# Patient Record
Sex: Male | Born: 1945 | Race: White | Hispanic: No | Marital: Single | Smoking: Current every day smoker
Health system: Southern US, Community
[De-identification: ages and names within clinical notes are randomized; demographics above are authoritative.]

## PROBLEM LIST (undated history)

## (undated) DIAGNOSIS — I10 Essential (primary) hypertension: Secondary | ICD-10-CM

## (undated) DIAGNOSIS — I739 Peripheral vascular disease, unspecified: Secondary | ICD-10-CM

## (undated) DIAGNOSIS — Z720 Tobacco use: Secondary | ICD-10-CM

## (undated) DIAGNOSIS — E785 Hyperlipidemia, unspecified: Secondary | ICD-10-CM

## (undated) DIAGNOSIS — I219 Acute myocardial infarction, unspecified: Secondary | ICD-10-CM

## (undated) DIAGNOSIS — E119 Type 2 diabetes mellitus without complications: Secondary | ICD-10-CM

## (undated) HISTORY — DX: Type 2 diabetes mellitus without complications: E11.9

## (undated) HISTORY — DX: Hyperlipidemia, unspecified: E78.5

## (undated) HISTORY — DX: Essential (primary) hypertension: I10

## (undated) HISTORY — DX: Peripheral vascular disease, unspecified: I73.9

---

## 2016-11-19 DIAGNOSIS — Z9181 History of falling: Secondary | ICD-10-CM | POA: Diagnosis not present

## 2016-11-19 DIAGNOSIS — Z0000 Encounter for general adult medical examination without abnormal findings: Secondary | ICD-10-CM | POA: Diagnosis not present

## 2016-11-19 DIAGNOSIS — Z125 Encounter for screening for malignant neoplasm of prostate: Secondary | ICD-10-CM | POA: Diagnosis not present

## 2016-11-19 DIAGNOSIS — I10 Essential (primary) hypertension: Secondary | ICD-10-CM | POA: Diagnosis not present

## 2016-11-19 DIAGNOSIS — E7800 Pure hypercholesterolemia, unspecified: Secondary | ICD-10-CM | POA: Diagnosis not present

## 2016-11-19 DIAGNOSIS — Z1389 Encounter for screening for other disorder: Secondary | ICD-10-CM | POA: Diagnosis not present

## 2016-11-19 DIAGNOSIS — R7303 Prediabetes: Secondary | ICD-10-CM | POA: Diagnosis not present

## 2016-11-19 DIAGNOSIS — E663 Overweight: Secondary | ICD-10-CM | POA: Diagnosis not present

## 2016-12-03 DIAGNOSIS — Z79899 Other long term (current) drug therapy: Secondary | ICD-10-CM | POA: Diagnosis not present

## 2016-12-03 DIAGNOSIS — E785 Hyperlipidemia, unspecified: Secondary | ICD-10-CM | POA: Diagnosis not present

## 2016-12-03 DIAGNOSIS — R69 Illness, unspecified: Secondary | ICD-10-CM | POA: Diagnosis not present

## 2016-12-03 DIAGNOSIS — E1169 Type 2 diabetes mellitus with other specified complication: Secondary | ICD-10-CM | POA: Diagnosis not present

## 2016-12-03 DIAGNOSIS — Z716 Tobacco abuse counseling: Secondary | ICD-10-CM | POA: Diagnosis not present

## 2016-12-03 DIAGNOSIS — J449 Chronic obstructive pulmonary disease, unspecified: Secondary | ICD-10-CM | POA: Diagnosis not present

## 2016-12-03 DIAGNOSIS — Z720 Tobacco use: Secondary | ICD-10-CM | POA: Diagnosis not present

## 2016-12-03 DIAGNOSIS — I10 Essential (primary) hypertension: Secondary | ICD-10-CM | POA: Diagnosis not present

## 2016-12-03 DIAGNOSIS — I7090 Unspecified atherosclerosis: Secondary | ICD-10-CM | POA: Diagnosis not present

## 2016-12-17 DIAGNOSIS — E1169 Type 2 diabetes mellitus with other specified complication: Secondary | ICD-10-CM | POA: Diagnosis not present

## 2016-12-17 DIAGNOSIS — E785 Hyperlipidemia, unspecified: Secondary | ICD-10-CM | POA: Diagnosis not present

## 2016-12-17 DIAGNOSIS — J449 Chronic obstructive pulmonary disease, unspecified: Secondary | ICD-10-CM | POA: Diagnosis not present

## 2016-12-23 DIAGNOSIS — R351 Nocturia: Secondary | ICD-10-CM | POA: Diagnosis not present

## 2016-12-23 DIAGNOSIS — I10 Essential (primary) hypertension: Secondary | ICD-10-CM | POA: Diagnosis not present

## 2016-12-23 DIAGNOSIS — Z79899 Other long term (current) drug therapy: Secondary | ICD-10-CM | POA: Diagnosis not present

## 2016-12-23 DIAGNOSIS — K08409 Partial loss of teeth, unspecified cause, unspecified class: Secondary | ICD-10-CM | POA: Diagnosis not present

## 2016-12-23 DIAGNOSIS — R69 Illness, unspecified: Secondary | ICD-10-CM | POA: Diagnosis not present

## 2016-12-23 DIAGNOSIS — Z0000 Encounter for general adult medical examination without abnormal findings: Secondary | ICD-10-CM | POA: Diagnosis not present

## 2016-12-23 DIAGNOSIS — Z972 Presence of dental prosthetic device (complete) (partial): Secondary | ICD-10-CM | POA: Diagnosis not present

## 2016-12-23 DIAGNOSIS — J449 Chronic obstructive pulmonary disease, unspecified: Secondary | ICD-10-CM | POA: Diagnosis not present

## 2016-12-23 DIAGNOSIS — E663 Overweight: Secondary | ICD-10-CM | POA: Diagnosis not present

## 2016-12-23 DIAGNOSIS — E785 Hyperlipidemia, unspecified: Secondary | ICD-10-CM | POA: Diagnosis not present

## 2016-12-23 DIAGNOSIS — E119 Type 2 diabetes mellitus without complications: Secondary | ICD-10-CM | POA: Diagnosis not present

## 2016-12-23 DIAGNOSIS — R3915 Urgency of urination: Secondary | ICD-10-CM | POA: Diagnosis not present

## 2017-02-24 DIAGNOSIS — H524 Presbyopia: Secondary | ICD-10-CM | POA: Diagnosis not present

## 2017-02-24 DIAGNOSIS — H25042 Posterior subcapsular polar age-related cataract, left eye: Secondary | ICD-10-CM | POA: Diagnosis not present

## 2017-02-24 DIAGNOSIS — H5213 Myopia, bilateral: Secondary | ICD-10-CM | POA: Diagnosis not present

## 2017-02-24 DIAGNOSIS — H52223 Regular astigmatism, bilateral: Secondary | ICD-10-CM | POA: Diagnosis not present

## 2017-02-24 DIAGNOSIS — H25811 Combined forms of age-related cataract, right eye: Secondary | ICD-10-CM | POA: Diagnosis not present

## 2017-02-25 DIAGNOSIS — H524 Presbyopia: Secondary | ICD-10-CM | POA: Diagnosis not present

## 2017-02-25 DIAGNOSIS — H52209 Unspecified astigmatism, unspecified eye: Secondary | ICD-10-CM | POA: Diagnosis not present

## 2017-02-25 DIAGNOSIS — H5213 Myopia, bilateral: Secondary | ICD-10-CM | POA: Diagnosis not present

## 2017-04-16 DIAGNOSIS — E785 Hyperlipidemia, unspecified: Secondary | ICD-10-CM | POA: Diagnosis not present

## 2017-04-16 DIAGNOSIS — Z1389 Encounter for screening for other disorder: Secondary | ICD-10-CM | POA: Diagnosis not present

## 2017-04-16 DIAGNOSIS — R7303 Prediabetes: Secondary | ICD-10-CM | POA: Diagnosis not present

## 2017-04-16 DIAGNOSIS — I10 Essential (primary) hypertension: Secondary | ICD-10-CM | POA: Diagnosis not present

## 2017-04-16 DIAGNOSIS — Z125 Encounter for screening for malignant neoplasm of prostate: Secondary | ICD-10-CM | POA: Diagnosis not present

## 2017-04-16 DIAGNOSIS — Z23 Encounter for immunization: Secondary | ICD-10-CM | POA: Diagnosis not present

## 2017-04-16 DIAGNOSIS — Z0000 Encounter for general adult medical examination without abnormal findings: Secondary | ICD-10-CM | POA: Diagnosis not present

## 2017-04-16 DIAGNOSIS — Z136 Encounter for screening for cardiovascular disorders: Secondary | ICD-10-CM | POA: Diagnosis not present

## 2017-04-16 DIAGNOSIS — Z79899 Other long term (current) drug therapy: Secondary | ICD-10-CM | POA: Diagnosis not present

## 2017-06-02 DIAGNOSIS — J449 Chronic obstructive pulmonary disease, unspecified: Secondary | ICD-10-CM | POA: Diagnosis not present

## 2017-06-02 DIAGNOSIS — Z87891 Personal history of nicotine dependence: Secondary | ICD-10-CM | POA: Diagnosis not present

## 2017-06-02 DIAGNOSIS — E1169 Type 2 diabetes mellitus with other specified complication: Secondary | ICD-10-CM | POA: Diagnosis not present

## 2017-06-02 DIAGNOSIS — I7090 Unspecified atherosclerosis: Secondary | ICD-10-CM | POA: Diagnosis not present

## 2017-06-02 DIAGNOSIS — Z716 Tobacco abuse counseling: Secondary | ICD-10-CM | POA: Diagnosis not present

## 2017-06-02 DIAGNOSIS — I10 Essential (primary) hypertension: Secondary | ICD-10-CM | POA: Diagnosis not present

## 2017-06-02 DIAGNOSIS — Z720 Tobacco use: Secondary | ICD-10-CM | POA: Diagnosis not present

## 2017-06-02 DIAGNOSIS — E785 Hyperlipidemia, unspecified: Secondary | ICD-10-CM | POA: Diagnosis not present

## 2017-06-02 DIAGNOSIS — E663 Overweight: Secondary | ICD-10-CM | POA: Diagnosis not present

## 2017-09-02 DIAGNOSIS — I7090 Unspecified atherosclerosis: Secondary | ICD-10-CM | POA: Diagnosis not present

## 2017-09-02 DIAGNOSIS — I10 Essential (primary) hypertension: Secondary | ICD-10-CM | POA: Diagnosis not present

## 2017-09-02 DIAGNOSIS — J449 Chronic obstructive pulmonary disease, unspecified: Secondary | ICD-10-CM | POA: Diagnosis not present

## 2017-09-02 DIAGNOSIS — D751 Secondary polycythemia: Secondary | ICD-10-CM | POA: Diagnosis not present

## 2017-09-02 DIAGNOSIS — E663 Overweight: Secondary | ICD-10-CM | POA: Diagnosis not present

## 2017-09-02 DIAGNOSIS — E785 Hyperlipidemia, unspecified: Secondary | ICD-10-CM | POA: Diagnosis not present

## 2017-09-02 DIAGNOSIS — E1169 Type 2 diabetes mellitus with other specified complication: Secondary | ICD-10-CM | POA: Diagnosis not present

## 2017-12-10 DIAGNOSIS — J449 Chronic obstructive pulmonary disease, unspecified: Secondary | ICD-10-CM | POA: Diagnosis not present

## 2017-12-10 DIAGNOSIS — I10 Essential (primary) hypertension: Secondary | ICD-10-CM | POA: Diagnosis not present

## 2017-12-10 DIAGNOSIS — Z79899 Other long term (current) drug therapy: Secondary | ICD-10-CM | POA: Diagnosis not present

## 2017-12-10 DIAGNOSIS — Z1211 Encounter for screening for malignant neoplasm of colon: Secondary | ICD-10-CM | POA: Diagnosis not present

## 2017-12-10 DIAGNOSIS — E119 Type 2 diabetes mellitus without complications: Secondary | ICD-10-CM | POA: Diagnosis not present

## 2017-12-10 DIAGNOSIS — Z720 Tobacco use: Secondary | ICD-10-CM | POA: Diagnosis not present

## 2017-12-10 DIAGNOSIS — Z125 Encounter for screening for malignant neoplasm of prostate: Secondary | ICD-10-CM | POA: Diagnosis not present

## 2017-12-10 DIAGNOSIS — Z0000 Encounter for general adult medical examination without abnormal findings: Secondary | ICD-10-CM | POA: Diagnosis not present

## 2017-12-22 DIAGNOSIS — I10 Essential (primary) hypertension: Secondary | ICD-10-CM | POA: Diagnosis not present

## 2018-01-22 DIAGNOSIS — Z1339 Encounter for screening examination for other mental health and behavioral disorders: Secondary | ICD-10-CM | POA: Diagnosis not present

## 2018-01-22 DIAGNOSIS — J449 Chronic obstructive pulmonary disease, unspecified: Secondary | ICD-10-CM | POA: Diagnosis not present

## 2018-01-22 DIAGNOSIS — I10 Essential (primary) hypertension: Secondary | ICD-10-CM | POA: Diagnosis not present

## 2018-03-18 DIAGNOSIS — E1169 Type 2 diabetes mellitus with other specified complication: Secondary | ICD-10-CM | POA: Diagnosis not present

## 2018-03-18 DIAGNOSIS — I10 Essential (primary) hypertension: Secondary | ICD-10-CM | POA: Diagnosis not present

## 2018-03-18 DIAGNOSIS — J449 Chronic obstructive pulmonary disease, unspecified: Secondary | ICD-10-CM | POA: Diagnosis not present

## 2018-04-02 DIAGNOSIS — H52223 Regular astigmatism, bilateral: Secondary | ICD-10-CM | POA: Diagnosis not present

## 2018-07-01 DIAGNOSIS — E119 Type 2 diabetes mellitus without complications: Secondary | ICD-10-CM | POA: Diagnosis not present

## 2018-07-01 DIAGNOSIS — I10 Essential (primary) hypertension: Secondary | ICD-10-CM | POA: Diagnosis not present

## 2018-07-01 DIAGNOSIS — R69 Illness, unspecified: Secondary | ICD-10-CM | POA: Diagnosis not present

## 2018-07-01 DIAGNOSIS — J449 Chronic obstructive pulmonary disease, unspecified: Secondary | ICD-10-CM | POA: Diagnosis not present

## 2018-10-14 DIAGNOSIS — M791 Myalgia, unspecified site: Secondary | ICD-10-CM | POA: Diagnosis not present

## 2018-10-14 DIAGNOSIS — R69 Illness, unspecified: Secondary | ICD-10-CM | POA: Diagnosis not present

## 2018-10-14 DIAGNOSIS — J449 Chronic obstructive pulmonary disease, unspecified: Secondary | ICD-10-CM | POA: Diagnosis not present

## 2018-10-14 DIAGNOSIS — E1169 Type 2 diabetes mellitus with other specified complication: Secondary | ICD-10-CM | POA: Diagnosis not present

## 2018-10-14 DIAGNOSIS — I10 Essential (primary) hypertension: Secondary | ICD-10-CM | POA: Diagnosis not present

## 2018-10-14 DIAGNOSIS — E119 Type 2 diabetes mellitus without complications: Secondary | ICD-10-CM | POA: Diagnosis not present

## 2018-10-20 DIAGNOSIS — E1169 Type 2 diabetes mellitus with other specified complication: Secondary | ICD-10-CM | POA: Diagnosis not present

## 2018-10-20 DIAGNOSIS — M791 Myalgia, unspecified site: Secondary | ICD-10-CM | POA: Diagnosis not present

## 2018-10-20 DIAGNOSIS — E119 Type 2 diabetes mellitus without complications: Secondary | ICD-10-CM | POA: Diagnosis not present

## 2019-02-03 DIAGNOSIS — I10 Essential (primary) hypertension: Secondary | ICD-10-CM | POA: Diagnosis not present

## 2019-02-03 DIAGNOSIS — E559 Vitamin D deficiency, unspecified: Secondary | ICD-10-CM | POA: Diagnosis not present

## 2019-02-03 DIAGNOSIS — Z1331 Encounter for screening for depression: Secondary | ICD-10-CM | POA: Diagnosis not present

## 2019-02-03 DIAGNOSIS — R69 Illness, unspecified: Secondary | ICD-10-CM | POA: Diagnosis not present

## 2019-02-03 DIAGNOSIS — E1169 Type 2 diabetes mellitus with other specified complication: Secondary | ICD-10-CM | POA: Diagnosis not present

## 2019-02-03 DIAGNOSIS — J449 Chronic obstructive pulmonary disease, unspecified: Secondary | ICD-10-CM | POA: Diagnosis not present

## 2019-02-03 DIAGNOSIS — Z139 Encounter for screening, unspecified: Secondary | ICD-10-CM | POA: Diagnosis not present

## 2019-02-03 DIAGNOSIS — E7800 Pure hypercholesterolemia, unspecified: Secondary | ICD-10-CM | POA: Diagnosis not present

## 2019-02-03 DIAGNOSIS — Z9181 History of falling: Secondary | ICD-10-CM | POA: Diagnosis not present

## 2019-02-03 DIAGNOSIS — N400 Enlarged prostate without lower urinary tract symptoms: Secondary | ICD-10-CM | POA: Diagnosis not present

## 2019-02-17 DIAGNOSIS — N400 Enlarged prostate without lower urinary tract symptoms: Secondary | ICD-10-CM | POA: Diagnosis not present

## 2019-02-17 DIAGNOSIS — E1169 Type 2 diabetes mellitus with other specified complication: Secondary | ICD-10-CM | POA: Diagnosis not present

## 2019-05-06 DIAGNOSIS — H5213 Myopia, bilateral: Secondary | ICD-10-CM | POA: Diagnosis not present

## 2019-05-11 DIAGNOSIS — E7800 Pure hypercholesterolemia, unspecified: Secondary | ICD-10-CM | POA: Diagnosis not present

## 2019-05-11 DIAGNOSIS — E559 Vitamin D deficiency, unspecified: Secondary | ICD-10-CM | POA: Diagnosis not present

## 2019-05-11 DIAGNOSIS — Z23 Encounter for immunization: Secondary | ICD-10-CM | POA: Diagnosis not present

## 2019-05-11 DIAGNOSIS — R69 Illness, unspecified: Secondary | ICD-10-CM | POA: Diagnosis not present

## 2019-05-11 DIAGNOSIS — N400 Enlarged prostate without lower urinary tract symptoms: Secondary | ICD-10-CM | POA: Diagnosis not present

## 2019-05-11 DIAGNOSIS — E1169 Type 2 diabetes mellitus with other specified complication: Secondary | ICD-10-CM | POA: Diagnosis not present

## 2019-05-11 DIAGNOSIS — I10 Essential (primary) hypertension: Secondary | ICD-10-CM | POA: Diagnosis not present

## 2019-05-11 DIAGNOSIS — J449 Chronic obstructive pulmonary disease, unspecified: Secondary | ICD-10-CM | POA: Diagnosis not present

## 2019-05-19 DIAGNOSIS — Z125 Encounter for screening for malignant neoplasm of prostate: Secondary | ICD-10-CM | POA: Diagnosis not present

## 2019-05-19 DIAGNOSIS — Z0000 Encounter for general adult medical examination without abnormal findings: Secondary | ICD-10-CM | POA: Diagnosis not present

## 2019-05-19 DIAGNOSIS — Z136 Encounter for screening for cardiovascular disorders: Secondary | ICD-10-CM | POA: Diagnosis not present

## 2019-05-19 DIAGNOSIS — Z9181 History of falling: Secondary | ICD-10-CM | POA: Diagnosis not present

## 2019-05-19 DIAGNOSIS — E785 Hyperlipidemia, unspecified: Secondary | ICD-10-CM | POA: Diagnosis not present

## 2019-05-19 DIAGNOSIS — Z1211 Encounter for screening for malignant neoplasm of colon: Secondary | ICD-10-CM | POA: Diagnosis not present

## 2019-05-19 DIAGNOSIS — Z1331 Encounter for screening for depression: Secondary | ICD-10-CM | POA: Diagnosis not present

## 2019-08-02 DIAGNOSIS — E119 Type 2 diabetes mellitus without complications: Secondary | ICD-10-CM | POA: Diagnosis not present

## 2019-08-02 DIAGNOSIS — Z01818 Encounter for other preprocedural examination: Secondary | ICD-10-CM | POA: Diagnosis not present

## 2019-08-02 DIAGNOSIS — H25812 Combined forms of age-related cataract, left eye: Secondary | ICD-10-CM | POA: Diagnosis not present

## 2019-08-09 DIAGNOSIS — H259 Unspecified age-related cataract: Secondary | ICD-10-CM | POA: Diagnosis not present

## 2019-08-09 DIAGNOSIS — R69 Illness, unspecified: Secondary | ICD-10-CM | POA: Diagnosis not present

## 2019-08-09 DIAGNOSIS — J449 Chronic obstructive pulmonary disease, unspecified: Secondary | ICD-10-CM | POA: Diagnosis not present

## 2019-08-09 DIAGNOSIS — H25812 Combined forms of age-related cataract, left eye: Secondary | ICD-10-CM | POA: Diagnosis not present

## 2019-08-09 DIAGNOSIS — I10 Essential (primary) hypertension: Secondary | ICD-10-CM | POA: Diagnosis not present

## 2019-08-09 DIAGNOSIS — E785 Hyperlipidemia, unspecified: Secondary | ICD-10-CM | POA: Diagnosis not present

## 2019-08-09 DIAGNOSIS — E1136 Type 2 diabetes mellitus with diabetic cataract: Secondary | ICD-10-CM | POA: Diagnosis not present

## 2019-08-30 DIAGNOSIS — H40003 Preglaucoma, unspecified, bilateral: Secondary | ICD-10-CM | POA: Diagnosis not present

## 2019-08-30 DIAGNOSIS — E119 Type 2 diabetes mellitus without complications: Secondary | ICD-10-CM | POA: Diagnosis not present

## 2019-09-16 DIAGNOSIS — E1169 Type 2 diabetes mellitus with other specified complication: Secondary | ICD-10-CM | POA: Diagnosis not present

## 2019-09-16 DIAGNOSIS — I10 Essential (primary) hypertension: Secondary | ICD-10-CM | POA: Diagnosis not present

## 2019-09-16 DIAGNOSIS — M791 Myalgia, unspecified site: Secondary | ICD-10-CM | POA: Diagnosis not present

## 2019-09-16 DIAGNOSIS — E559 Vitamin D deficiency, unspecified: Secondary | ICD-10-CM | POA: Diagnosis not present

## 2019-09-16 DIAGNOSIS — E7800 Pure hypercholesterolemia, unspecified: Secondary | ICD-10-CM | POA: Diagnosis not present

## 2019-09-16 DIAGNOSIS — Z6829 Body mass index (BMI) 29.0-29.9, adult: Secondary | ICD-10-CM | POA: Diagnosis not present

## 2019-09-16 DIAGNOSIS — N400 Enlarged prostate without lower urinary tract symptoms: Secondary | ICD-10-CM | POA: Diagnosis not present

## 2019-09-16 DIAGNOSIS — R69 Illness, unspecified: Secondary | ICD-10-CM | POA: Diagnosis not present

## 2019-09-16 DIAGNOSIS — J449 Chronic obstructive pulmonary disease, unspecified: Secondary | ICD-10-CM | POA: Diagnosis not present

## 2019-09-20 DIAGNOSIS — R69 Illness, unspecified: Secondary | ICD-10-CM | POA: Diagnosis not present

## 2019-09-20 DIAGNOSIS — E1136 Type 2 diabetes mellitus with diabetic cataract: Secondary | ICD-10-CM | POA: Diagnosis not present

## 2019-09-20 DIAGNOSIS — E785 Hyperlipidemia, unspecified: Secondary | ICD-10-CM | POA: Diagnosis not present

## 2019-09-20 DIAGNOSIS — I10 Essential (primary) hypertension: Secondary | ICD-10-CM | POA: Diagnosis not present

## 2019-09-20 DIAGNOSIS — J449 Chronic obstructive pulmonary disease, unspecified: Secondary | ICD-10-CM | POA: Diagnosis not present

## 2019-09-20 DIAGNOSIS — H40003 Preglaucoma, unspecified, bilateral: Secondary | ICD-10-CM | POA: Diagnosis not present

## 2019-09-20 DIAGNOSIS — Z7984 Long term (current) use of oral hypoglycemic drugs: Secondary | ICD-10-CM | POA: Diagnosis not present

## 2019-09-20 DIAGNOSIS — H259 Unspecified age-related cataract: Secondary | ICD-10-CM | POA: Diagnosis not present

## 2019-09-20 DIAGNOSIS — Z79899 Other long term (current) drug therapy: Secondary | ICD-10-CM | POA: Diagnosis not present

## 2019-09-20 DIAGNOSIS — H25811 Combined forms of age-related cataract, right eye: Secondary | ICD-10-CM | POA: Diagnosis not present

## 2019-10-11 DIAGNOSIS — E875 Hyperkalemia: Secondary | ICD-10-CM | POA: Diagnosis not present

## 2020-01-04 DIAGNOSIS — E1169 Type 2 diabetes mellitus with other specified complication: Secondary | ICD-10-CM | POA: Diagnosis not present

## 2020-01-04 DIAGNOSIS — R69 Illness, unspecified: Secondary | ICD-10-CM | POA: Diagnosis not present

## 2020-01-04 DIAGNOSIS — J449 Chronic obstructive pulmonary disease, unspecified: Secondary | ICD-10-CM | POA: Diagnosis not present

## 2020-01-04 DIAGNOSIS — E559 Vitamin D deficiency, unspecified: Secondary | ICD-10-CM | POA: Diagnosis not present

## 2020-01-04 DIAGNOSIS — N400 Enlarged prostate without lower urinary tract symptoms: Secondary | ICD-10-CM | POA: Diagnosis not present

## 2020-01-04 DIAGNOSIS — Z6829 Body mass index (BMI) 29.0-29.9, adult: Secondary | ICD-10-CM | POA: Diagnosis not present

## 2020-01-04 DIAGNOSIS — I10 Essential (primary) hypertension: Secondary | ICD-10-CM | POA: Diagnosis not present

## 2020-01-04 DIAGNOSIS — E7800 Pure hypercholesterolemia, unspecified: Secondary | ICD-10-CM | POA: Diagnosis not present

## 2020-02-03 DIAGNOSIS — I10 Essential (primary) hypertension: Secondary | ICD-10-CM | POA: Diagnosis not present

## 2020-05-09 DIAGNOSIS — Z1212 Encounter for screening for malignant neoplasm of rectum: Secondary | ICD-10-CM | POA: Diagnosis not present

## 2020-05-09 DIAGNOSIS — E1169 Type 2 diabetes mellitus with other specified complication: Secondary | ICD-10-CM | POA: Diagnosis not present

## 2020-05-09 DIAGNOSIS — Z1211 Encounter for screening for malignant neoplasm of colon: Secondary | ICD-10-CM | POA: Diagnosis not present

## 2020-05-09 DIAGNOSIS — Z23 Encounter for immunization: Secondary | ICD-10-CM | POA: Diagnosis not present

## 2020-05-09 DIAGNOSIS — N400 Enlarged prostate without lower urinary tract symptoms: Secondary | ICD-10-CM | POA: Diagnosis not present

## 2020-05-09 DIAGNOSIS — E7800 Pure hypercholesterolemia, unspecified: Secondary | ICD-10-CM | POA: Diagnosis not present

## 2020-05-09 DIAGNOSIS — R69 Illness, unspecified: Secondary | ICD-10-CM | POA: Diagnosis not present

## 2020-05-09 DIAGNOSIS — I10 Essential (primary) hypertension: Secondary | ICD-10-CM | POA: Diagnosis not present

## 2020-05-09 DIAGNOSIS — J449 Chronic obstructive pulmonary disease, unspecified: Secondary | ICD-10-CM | POA: Diagnosis not present

## 2020-05-09 DIAGNOSIS — Z125 Encounter for screening for malignant neoplasm of prostate: Secondary | ICD-10-CM | POA: Diagnosis not present

## 2020-05-09 DIAGNOSIS — Z139 Encounter for screening, unspecified: Secondary | ICD-10-CM | POA: Diagnosis not present

## 2020-05-22 DIAGNOSIS — Z0000 Encounter for general adult medical examination without abnormal findings: Secondary | ICD-10-CM | POA: Diagnosis not present

## 2020-05-22 DIAGNOSIS — E785 Hyperlipidemia, unspecified: Secondary | ICD-10-CM | POA: Diagnosis not present

## 2020-05-22 DIAGNOSIS — Z1331 Encounter for screening for depression: Secondary | ICD-10-CM | POA: Diagnosis not present

## 2020-05-22 DIAGNOSIS — Z9181 History of falling: Secondary | ICD-10-CM | POA: Diagnosis not present

## 2020-07-17 DIAGNOSIS — Z6828 Body mass index (BMI) 28.0-28.9, adult: Secondary | ICD-10-CM | POA: Diagnosis not present

## 2020-07-17 DIAGNOSIS — I7090 Unspecified atherosclerosis: Secondary | ICD-10-CM | POA: Diagnosis not present

## 2020-07-17 DIAGNOSIS — S91209A Unspecified open wound of unspecified toe(s) with damage to nail, initial encounter: Secondary | ICD-10-CM | POA: Diagnosis not present

## 2020-07-17 DIAGNOSIS — M79662 Pain in left lower leg: Secondary | ICD-10-CM | POA: Diagnosis not present

## 2020-07-17 DIAGNOSIS — M79661 Pain in right lower leg: Secondary | ICD-10-CM | POA: Diagnosis not present

## 2020-07-17 DIAGNOSIS — Z1211 Encounter for screening for malignant neoplasm of colon: Secondary | ICD-10-CM | POA: Diagnosis not present

## 2020-07-17 DIAGNOSIS — L03032 Cellulitis of left toe: Secondary | ICD-10-CM | POA: Diagnosis not present

## 2020-07-17 DIAGNOSIS — E119 Type 2 diabetes mellitus without complications: Secondary | ICD-10-CM | POA: Diagnosis not present

## 2020-07-24 DIAGNOSIS — S91209A Unspecified open wound of unspecified toe(s) with damage to nail, initial encounter: Secondary | ICD-10-CM | POA: Diagnosis not present

## 2020-07-24 DIAGNOSIS — Z87891 Personal history of nicotine dependence: Secondary | ICD-10-CM | POA: Diagnosis not present

## 2020-07-24 DIAGNOSIS — E119 Type 2 diabetes mellitus without complications: Secondary | ICD-10-CM | POA: Diagnosis not present

## 2020-07-24 DIAGNOSIS — M7989 Other specified soft tissue disorders: Secondary | ICD-10-CM | POA: Diagnosis not present

## 2020-07-24 DIAGNOSIS — L03032 Cellulitis of left toe: Secondary | ICD-10-CM | POA: Diagnosis not present

## 2020-07-24 DIAGNOSIS — Z6828 Body mass index (BMI) 28.0-28.9, adult: Secondary | ICD-10-CM | POA: Diagnosis not present

## 2020-07-24 DIAGNOSIS — I7090 Unspecified atherosclerosis: Secondary | ICD-10-CM | POA: Diagnosis not present

## 2020-08-03 ENCOUNTER — Ambulatory Visit (INDEPENDENT_AMBULATORY_CARE_PROVIDER_SITE_OTHER): Payer: Medicare HMO

## 2020-08-03 ENCOUNTER — Encounter: Payer: Self-pay | Admitting: Sports Medicine

## 2020-08-03 ENCOUNTER — Other Ambulatory Visit: Payer: Self-pay | Admitting: *Deleted

## 2020-08-03 ENCOUNTER — Other Ambulatory Visit: Payer: Self-pay

## 2020-08-03 ENCOUNTER — Ambulatory Visit (INDEPENDENT_AMBULATORY_CARE_PROVIDER_SITE_OTHER): Payer: Medicare HMO | Admitting: Sports Medicine

## 2020-08-03 DIAGNOSIS — L02612 Cutaneous abscess of left foot: Secondary | ICD-10-CM | POA: Diagnosis not present

## 2020-08-03 DIAGNOSIS — S91209D Unspecified open wound of unspecified toe(s) with damage to nail, subsequent encounter: Secondary | ICD-10-CM | POA: Diagnosis not present

## 2020-08-03 DIAGNOSIS — F17200 Nicotine dependence, unspecified, uncomplicated: Secondary | ICD-10-CM

## 2020-08-03 DIAGNOSIS — L97529 Non-pressure chronic ulcer of other part of left foot with unspecified severity: Secondary | ICD-10-CM | POA: Diagnosis not present

## 2020-08-03 DIAGNOSIS — E119 Type 2 diabetes mellitus without complications: Secondary | ICD-10-CM | POA: Diagnosis not present

## 2020-08-03 DIAGNOSIS — G629 Polyneuropathy, unspecified: Secondary | ICD-10-CM | POA: Diagnosis not present

## 2020-08-03 DIAGNOSIS — L03032 Cellulitis of left toe: Secondary | ICD-10-CM | POA: Diagnosis not present

## 2020-08-03 DIAGNOSIS — I7090 Unspecified atherosclerosis: Secondary | ICD-10-CM | POA: Diagnosis not present

## 2020-08-03 DIAGNOSIS — L539 Erythematous condition, unspecified: Secondary | ICD-10-CM | POA: Diagnosis not present

## 2020-08-03 DIAGNOSIS — I739 Peripheral vascular disease, unspecified: Secondary | ICD-10-CM | POA: Diagnosis not present

## 2020-08-03 DIAGNOSIS — Z6828 Body mass index (BMI) 28.0-28.9, adult: Secondary | ICD-10-CM | POA: Diagnosis not present

## 2020-08-03 DIAGNOSIS — R69 Illness, unspecified: Secondary | ICD-10-CM | POA: Diagnosis not present

## 2020-08-03 MED ORDER — NEOMYCIN-POLYMYXIN-HC 3.5-10000-1 OT SOLN: OTIC | 0 refills | Status: DC

## 2020-08-03 NOTE — Progress Notes (Signed)
Subjective: Mark Dickerson is a 75 y.o. male patient seen in office for evaluation of dry scab to the toe and redness to all toes on the left foot.  Patient reports that he has been dealing with this for about a month has been treated at his primary care doctor where they gave him 2 rounds of antibiotic currently finishing up ciprofloxacin and reports that the redness has gotten a little better but still is concerning since it has not resolved after multiple rounds of antibiotic.  Patient also reports that he had an x-ray and also got blood work done as well.  Patient is diabetic with last A1c not recorded and reports that he is supposed to get one done.  Patient also is a smoker and smokes half a pack a day for the last 15 years.  Admits some occasional sharp shooting pain and cramping to the left lower extremity but denies nausea/fever/vomiting/chills/night sweats/shortness of breath.  Reports that he did have an episode of belly pain and nausea so he changed how he was taking his antibiotics to just once a day and that helps to get rid of any belly pain or diarrhea.  Patient has no other pedal complaints at this time.  Review of system noncontributory  There are no problems to display for this patient.  No current outpatient medications on file prior to visit.   No current facility-administered medications on file prior to visit.   No Known Allergies  No results found for this or any previous visit (from the past 2160 hour(s)).  Objective: There were no vitals filed for this visit.  General: Patient is awake, alert, oriented x 3 and in no acute distress.  Dermatology: Skin is warm and dry bilateral with dry crusted over wound/scab at the left third toe nailbed.  There is no open wounds but there is very mild interdigital maceration noted in the spaces of the left.  There is significant redness across the level of the metatarsals and distal toes that is blanchable in nature with very minimal warmth  this redness improves with elevation of the leg likely consistent with a vascular component/dependent rubor.  No active drainage, no malodor, no other acute signs of infection.   Vascular: Dorsalis Pedis pulse = 1/4 Bilateral faint,  Posterior Tibial pulse = 0/4 Bilateral,  Capillary Fill Time < 5 seconds with dependent rubor  Neurologic: Protective sensation diminished with monofilament.  Musculosketal: There is no pain to palpation to left forefoot.  There is mild hammertoe deformity noted.  X-rays reviewed from [facility] performed on 07/24/2020 with no acute osseous findings no signs of gangrene or gas in soft tissue no signs of acute osteomyelitis.  No results for input(s): GRAMSTAIN, LABORGA in the last 8760 hours.  Assessment and Plan:  Problem List Items Addressed This Visit   None   Visit Diagnoses    PAD (peripheral artery disease) (HCC)    -  Primary   Cellulitis and abscess of toe of left foot       Relevant Orders   DG Foot Complete Left   Toe ulcer, left, with unspecified severity (HCC)       Claudication (HCC)       Smoker          -Examined patient and discussed the progression of the redness to toes with dry wound/scab to left third toe and treatment alternatives. -Xrays reviewed from [place] -Advised patient to keep left foot clean and dry and to dry well in  between the toes and apply Corticosporin solution to the interspaces as prescribed -Continue with daily monitoring of dark area to left third toenail bed if worsens to notify office as soon as possible at this time no medication is needed at the left third toe -Advised patient that redness is likely a vascular component since it improves with elevation of the lower extremity; ordered ABIs for further evaluation -Recommend patient to continue with taking his last few pills of ciprofloxacin at this time I have reviewed his blood work and his x-rays and there is no signs of acute infection warranting any  additional antibiotics however he will come back to the office on next week for me to reassess -Advised patient if foot worsens and he is not able to come to office to go to ER or urgent care -Advised patient to wear a shoe that does not rub the toes especially the left third toe -Patient to return to office in 1 week for follow up care and evaluation or sooner if problems arise.  Asencion Islam, DPM

## 2020-08-09 DIAGNOSIS — I739 Peripheral vascular disease, unspecified: Secondary | ICD-10-CM | POA: Diagnosis not present

## 2020-08-09 DIAGNOSIS — I708 Atherosclerosis of other arteries: Secondary | ICD-10-CM | POA: Diagnosis not present

## 2020-08-10 ENCOUNTER — Other Ambulatory Visit: Payer: Self-pay | Admitting: Sports Medicine

## 2020-08-10 ENCOUNTER — Other Ambulatory Visit: Payer: Self-pay

## 2020-08-10 ENCOUNTER — Encounter: Payer: Self-pay | Admitting: Sports Medicine

## 2020-08-10 ENCOUNTER — Ambulatory Visit: Payer: Medicare HMO | Admitting: Sports Medicine

## 2020-08-10 DIAGNOSIS — L539 Erythematous condition, unspecified: Secondary | ICD-10-CM | POA: Diagnosis not present

## 2020-08-10 DIAGNOSIS — I739 Peripheral vascular disease, unspecified: Secondary | ICD-10-CM

## 2020-08-10 DIAGNOSIS — L03032 Cellulitis of left toe: Secondary | ICD-10-CM | POA: Diagnosis not present

## 2020-08-10 DIAGNOSIS — F17200 Nicotine dependence, unspecified, uncomplicated: Secondary | ICD-10-CM

## 2020-08-10 DIAGNOSIS — L02612 Cutaneous abscess of left foot: Secondary | ICD-10-CM

## 2020-08-10 DIAGNOSIS — L97529 Non-pressure chronic ulcer of other part of left foot with unspecified severity: Secondary | ICD-10-CM

## 2020-08-10 DIAGNOSIS — R69 Illness, unspecified: Secondary | ICD-10-CM | POA: Diagnosis not present

## 2020-08-10 NOTE — Progress Notes (Signed)
Subjective: Macguire Holsinger is a 75 y.o. male patient returns office for follow-up evaluation of left foot cellulitis and wound at left third toe.  Patient is assisted by daughter this visit and states that father got circulation test done on yesterday and they are here for results.  Patient denies nausea vomiting fever chills or any constitutional symptoms states that there is no change with his foot pain at the bottom of the foot is 8 out of 10 and reports that he has been keeping the area clean and dry and taking his antibiotics which he has 1 more pill left.  Patient denies any other pedal complaints at this time.  There are no problems to display for this patient.  Current Outpatient Medications on File Prior to Visit  Medication Sig Dispense Refill  . amLODipine (NORVASC) 10 MG tablet Take 10 mg by mouth daily.    . benazepril (LOTENSIN) 40 MG tablet Take 40 mg by mouth daily.    . ciprofloxacin (CIPRO) 500 MG tablet Take 500 mg by mouth 2 (two) times daily.    . clindamycin (CLEOCIN) 300 MG capsule Take 300 mg by mouth 3 (three) times daily.    . fenofibrate (TRICOR) 145 MG tablet Take 145 mg by mouth daily.    . hydrochlorothiazide (HYDRODIURIL) 25 MG tablet Take 25 mg by mouth daily.    . metFORMIN (GLUCOPHAGE-XR) 500 MG 24 hr tablet Take 500 mg by mouth 2 (two) times daily.    Marland Kitchen neomycin-polymyxin-hydrocortisone (CORTISPORIN) OTIC solution Apply 1-2 drops in between toes twice a day 10 mL 0  . pravastatin (PRAVACHOL) 20 MG tablet Take 20 mg by mouth daily.    . SYMBICORT 160-4.5 MCG/ACT inhaler SMARTSIG:2 Puff(s) By Mouth Twice Daily    . Vitamin D, Ergocalciferol, (DRISDOL) 1.25 MG (50000 UNIT) CAPS capsule Take 50,000 Units by mouth once a week.     No current facility-administered medications on file prior to visit.   No Known Allergies  No results found for this or any previous visit (from the past 2160 hour(s)).  Objective: There were no vitals filed for this visit.  General:  Patient is awake, alert, oriented x 3 and in no acute distress.  Dermatology: Skin is warm and dry bilateral with dry crusted over wound/scab at the left third toe nailbed unchanged from last week.  There is significant redness across the level of the metatarsals and distal toes that is blanchable in nature with very minimal warmth this redness improves with elevation of the leg likely consistent with a vascular component/dependent rubor.  No active drainage, no malodor, no other acute signs of infection.   Vascular: Dorsalis Pedis pulse = 1/4 Bilateral faint,  Posterior Tibial pulse = 0/4 Bilateral,  Capillary Fill Time < 5 seconds with dependent rubor  Neurologic: Protective sensation diminished with monofilament.  Musculosketal: There is no pain to palpation to left forefoot but there is some pain to the heel where there is small skin fissures noted on the left.  There is mild hammertoe deformity noted.  Suggest severe occlusive disease  No results for input(s): GRAMSTAIN, LABORGA in the last 8760 hours.  Assessment and Plan:  Problem List Items Addressed This Visit   None   Visit Diagnoses    Toe ulcer, left, with unspecified severity (HCC)    -  Primary   Dry escharg to nail bed of 3rd toe   PAD (peripheral artery disease) (HCC)       Dependent rubor  Cellulitis and abscess of toe of left foot       Claudication (HCC)       Smoker          -Examined patient  -Discussed with patient ABI which revealed severe occlusive disease -Referral placed to interventional radiology vascular -Advised patient to keep toes clean and dry to discontinue any medication to the toes -Continue with oral antibiotics until it is complete and last p.o. -Dispensed a surgical shoe with heel pad to prevent rubbing or pressure to toes and to heel -Dispensed foot miracle cream to use at skin fissures on the left heel -Advised patient to closely monitor if worsens to return to office or go to  ER -Patient to return to office in or sooner problems arise.  Advised patient to call office if he has not heard anything about an appointment for his referral by Wednesday of next week. Asencion Islam, DPM

## 2020-08-10 NOTE — Progress Notes (Signed)
ABI reviewed. Referral sent to IR Vascular Dr. Ruthy Dick

## 2020-08-14 ENCOUNTER — Ambulatory Visit
Admission: RE | Admit: 2020-08-14 | Discharge: 2020-08-14 | Disposition: A | Discharge status: Home / Self Care | Payer: Medicare HMO | Source: Ambulatory Visit | Attending: Sports Medicine | Admitting: Sports Medicine

## 2020-08-14 ENCOUNTER — Encounter: Payer: Self-pay | Admitting: *Deleted

## 2020-08-14 DIAGNOSIS — I70262 Atherosclerosis of native arteries of extremities with gangrene, left leg: Secondary | ICD-10-CM | POA: Diagnosis not present

## 2020-08-14 DIAGNOSIS — I739 Peripheral vascular disease, unspecified: Secondary | ICD-10-CM

## 2020-08-14 HISTORY — PX: IR RADIOLOGIST EVAL & MGMT: IMG5224

## 2020-08-14 NOTE — Consult Note (Signed)
Chief Complaint: Patient was seen in consultation today for peripheral artery disease, left toe gangrene  Referring Physician(s): Stover,Titorya  Patient Status: [facility] - Out-pt  History of Present Illness: Mark Dickerson is a 75 y.o. male presenting with several weeks of worsening redness and pain in the left foot.  This was initially worse in the left third toe surrounding the nail bed, and the nail was subsequently removed by his primary care provider.  The redness and pain worsened and has spread throughout the toes and forefoot, with associated eschar formation about the nail bed.  He has been on two different rounds of oral antibiotics, last course completed a few days ago.  In addition to the skin changes, he endorses severe bilateral calf claudication which begins after walking approximately 50 feet.  Denies rest pain.  No fevers, chills, chest pain, shortness of breath, nausea, vomiting, abdominal pain.  Pertinent past medical history is significant for smoking (half a pack/day over the past couple decades), hyperlipidemia (on low dose statin), hypertension (on 3 medications, high doses), type 2 diabetes mellitus (on metformin, recently doubled dose due to increased A1c in the 7 range).   No history of coronary artery disease or stroke.    He is accompanied today by his daughter.  No past medical history on file.  No past surgical history on file.  Allergies: Patient has no known allergies.  Medications: Prior to Admission medications   Medication Sig Start Date End Date Taking? Authorizing Provider  amLODipine (NORVASC) 10 MG tablet Take 10 mg by mouth daily. 04/05/20   [provider]  benazepril (LOTENSIN) 40 MG tablet Take 40 mg by mouth daily. 04/03/20   [provider]  ciprofloxacin (CIPRO) 500 MG tablet Take 500 mg by mouth 2 (two) times daily. 07/24/20   [provider]  clindamycin (CLEOCIN) 300 MG capsule Take 300 mg by mouth 3 (three) times  daily. 07/17/20   [provider]  fenofibrate (TRICOR) 145 MG tablet Take 145 mg by mouth daily. 07/11/20   [provider]  hydrochlorothiazide (HYDRODIURIL) 25 MG tablet Take 25 mg by mouth daily. 05/15/20   [provider]  metFORMIN (GLUCOPHAGE-XR) 500 MG 24 hr tablet Take 500 mg by mouth 2 (two) times daily. 05/17/20   [provider]  neomycin-polymyxin-hydrocortisone (CORTISPORIN) OTIC solution Apply 1-2 drops in between toes twice a day 08/03/20   Asencion Islam, DPM  pravastatin (PRAVACHOL) 20 MG tablet Take 20 mg by mouth daily. 07/09/20   [provider]  SYMBICORT 160-4.5 MCG/ACT inhaler SMARTSIG:2 Puff(s) By Mouth Twice Daily 08/01/20   [provider]  Vitamin D, Ergocalciferol, (DRISDOL) 1.25 MG (50000 UNIT) CAPS capsule Take 50,000 Units by mouth once a week. 08/01/20   [provider]     No family history on file.  Social History   Socioeconomic History  . Marital status: Single    Spouse name: Not on file  . Number of children: Not on file  . Years of education: Not on file  . Highest education level: Not on file  Occupational History  . Not on file  Tobacco Use  . Smoking status: Unknown If Ever Smoked  . Smokeless tobacco: Never Used  Substance and Sexual Activity  . Alcohol use: Not on file  . Drug use: Not on file  . Sexual activity: Not on file  Other Topics Concern  . Not on file  Social History Narrative  . Not on file   Social Determinants  of Health   Financial Resource Strain: Not on file  Food Insecurity: Not on file  Transportation Needs: Not on file  Physical Activity: Not on file  Stress: Not on file  Social Connections: Not on file    Review of Systems: A 12 point ROS discussed and pertinent positives are indicated in the HPI above.  All other systems are negative.  Vital Signs: There were no vitals taken for this visit.  Physical Exam Constitutional:      Appearance: Normal  appearance.  HENT:     Head: Normocephalic.     Mouth/Throat:     Mouth: Mucous membranes are moist.  Cardiovascular:     Rate and Rhythm: Normal rate and regular rhythm.     Comments: Non-palpable, non-dopplerable left PT/DP Pulmonary:     Breath sounds: Normal breath sounds.  Abdominal:     General: There is no distension.  Musculoskeletal:     Right lower leg: No edema.     Left lower leg: No edema.     Comments: Rubor about the left toes and distal forefoot, eschar about the left third digit nail bed.  Skin:    General: Skin is warm and dry.  Neurological:     Mental Status: He is alert and oriented to person, place, and time.     Imaging: ABI 08/09/20: Right = 0.54 (TBI 0.36, 61 mmHg), 0.20 after 1 minute exercise; monophasic waveforms throughout Left = 0.33 (TBI 0.19, 32 mmHg), 0.12 after 1 minute exercise; monophasic waveforms throughout, less than right  ABI 12/15/12: Right 0.89 Left 0.75  Labs:  CBC: No results for input(s): WBC, HGB, HCT, PLT in the last 8760 hours.  COAGS: No results for input(s): INR, APTT in the last 8760 hours.  BMP: No results for input(s): NA, K, CL, CO2, GLUCOSE, BUN, CALCIUM, CREATININE, GFRNONAA, GFRAA in the last 8760 hours.  Invalid input(s): CMP  LIVER FUNCTION TESTS: No results for input(s): BILITOT, AST, ALT, ALKPHOS, PROT, ALBUMIN in the last 8760 hours.  TUMOR MARKERS: No results for input(s): AFPTM, CEA, CA199, CHROMGRNA in the last 8760 hours.  Assessment and Plan:  75 year old male with severe (Rutherford class 6) peripheral artery disease, left greater than right, as evidenced by dry gangrene about the left third toe nailbed status post removal, with associated surrounding erythema about the toes and distal forefoot.  Waveforms on severely abnormal ABI exam suggestive of inflow disease as major component.  Recommend CTA abdomen/pelvis with bilateral lower extremity runoff for further evaluation of inflow disease and  procedural planning.  Once obtained, will contact patient with plan for intervention.  Discussed smoking cessation at length.  He states that he wants to quit but it not ready yet.  He was told to call if he wants resources as they are available.  Thank you for this interesting consult.  I greatly enjoyed meeting Mark Dickerson and look forward to participating in their care.  A copy of this report was sent to the requesting provider on this date.  Electronically Signed: Bennie Dallas, MD 08/14/2020, 1:29 PM   I spent a total of40 Minutes in face to face in clinical consultation, greater than 50% of which was counseling/coordinating care for peripheral artery disease.

## 2020-08-17 ENCOUNTER — Other Ambulatory Visit: Payer: Self-pay

## 2020-08-17 DIAGNOSIS — I739 Peripheral vascular disease, unspecified: Secondary | ICD-10-CM

## 2020-08-17 DIAGNOSIS — I70203 Unspecified atherosclerosis of native arteries of extremities, bilateral legs: Secondary | ICD-10-CM | POA: Diagnosis not present

## 2020-08-17 DIAGNOSIS — R6889 Other general symptoms and signs: Secondary | ICD-10-CM | POA: Diagnosis not present

## 2020-08-17 DIAGNOSIS — I771 Stricture of artery: Secondary | ICD-10-CM | POA: Diagnosis not present

## 2020-08-17 DIAGNOSIS — I745 Embolism and thrombosis of iliac artery: Secondary | ICD-10-CM | POA: Diagnosis not present

## 2020-08-17 DIAGNOSIS — I96 Gangrene, not elsewhere classified: Secondary | ICD-10-CM | POA: Diagnosis not present

## 2020-08-17 DIAGNOSIS — I708 Atherosclerosis of other arteries: Secondary | ICD-10-CM | POA: Diagnosis not present

## 2020-08-20 ENCOUNTER — Telehealth: Payer: Self-pay | Admitting: Interventional Radiology

## 2020-08-20 NOTE — Telephone Encounter (Signed)
Spoke with Mark Dickerson in addition to his daughter, Mark Dickerson, to update the patient on findings from recent CTA runoff and treatment plan.  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  Additionally, there is significant atherosclerotic disease of the outflow and proximal runoff vessels, L>R.   Plan for staged procedures starting with kissing bilateral iliac stents, followed by possible left lower extremity angiogram with angioplasty/stent placement pending recovering from first procedure.  All questions were answered.  Smoking cessation reinforced.  IR clinic/schedulers will arrange for procedure ASAP at [facility].  Marliss Coots, MD Pager: [phone]

## 2020-08-22 ENCOUNTER — Other Ambulatory Visit (HOSPITAL_COMMUNITY): Payer: Self-pay | Admitting: Interventional Radiology

## 2020-08-22 DIAGNOSIS — I739 Peripheral vascular disease, unspecified: Secondary | ICD-10-CM

## 2020-08-24 ENCOUNTER — Other Ambulatory Visit: Payer: Self-pay

## 2020-08-24 ENCOUNTER — Encounter: Payer: Self-pay | Admitting: Sports Medicine

## 2020-08-24 ENCOUNTER — Ambulatory Visit: Payer: Medicare HMO | Admitting: Sports Medicine

## 2020-08-24 DIAGNOSIS — L02612 Cutaneous abscess of left foot: Secondary | ICD-10-CM

## 2020-08-24 DIAGNOSIS — F17200 Nicotine dependence, unspecified, uncomplicated: Secondary | ICD-10-CM | POA: Diagnosis not present

## 2020-08-24 DIAGNOSIS — R69 Illness, unspecified: Secondary | ICD-10-CM | POA: Diagnosis not present

## 2020-08-24 DIAGNOSIS — I739 Peripheral vascular disease, unspecified: Secondary | ICD-10-CM | POA: Diagnosis not present

## 2020-08-24 DIAGNOSIS — L03032 Cellulitis of left toe: Secondary | ICD-10-CM | POA: Diagnosis not present

## 2020-08-24 DIAGNOSIS — L97529 Non-pressure chronic ulcer of other part of left foot with unspecified severity: Secondary | ICD-10-CM | POA: Diagnosis not present

## 2020-08-24 DIAGNOSIS — L539 Erythematous condition, unspecified: Secondary | ICD-10-CM | POA: Diagnosis not present

## 2020-08-24 MED ORDER — TRAMADOL HCL 50 MG PO TABS: 50.0000 mg | ORAL_TABLET | Freq: Three times a day (TID) | ORAL | 0 refills | Status: AC | PRN

## 2020-08-24 NOTE — Progress Notes (Signed)
Subjective: Lenon Kuennen is a 75 y.o. male patient returns office for follow-up evaluation of left foot cellulitis and wound at left third toe.  Patient is assisted by daughter this visit and states that things look about the same but he has been complaining of more pain especially at night.  No other pedal complaints noted.  There are no problems to display for this patient.  Current Outpatient Medications on File Prior to Visit  Medication Sig Dispense Refill  . amLODipine (NORVASC) 10 MG tablet Take 10 mg by mouth daily.    . benazepril (LOTENSIN) 40 MG tablet Take 40 mg by mouth daily.    . ciprofloxacin (CIPRO) 500 MG tablet Take 500 mg by mouth 2 (two) times daily.    . clindamycin (CLEOCIN) 300 MG capsule Take 300 mg by mouth 3 (three) times daily.    . fenofibrate (TRICOR) 145 MG tablet Take 145 mg by mouth daily.    . hydrochlorothiazide (HYDRODIURIL) 25 MG tablet Take 25 mg by mouth daily.    . metFORMIN (GLUCOPHAGE-XR) 500 MG 24 hr tablet Take 500 mg by mouth 2 (two) times daily.    Marland Kitchen neomycin-polymyxin-hydrocortisone (CORTISPORIN) OTIC solution Apply 1-2 drops in between toes twice a day 10 mL 0  . pravastatin (PRAVACHOL) 20 MG tablet Take 20 mg by mouth daily.    . SYMBICORT 160-4.5 MCG/ACT inhaler SMARTSIG:2 Puff(s) By Mouth Twice Daily    . Vitamin D, Ergocalciferol, (DRISDOL) 1.25 MG (50000 UNIT) CAPS capsule Take 50,000 Units by mouth once a week.     No current facility-administered medications on file prior to visit.   No Known Allergies  No results found for this or any previous visit (from the past 2160 hour(s)).  Objective: There were no vitals filed for this visit.  General: Patient is awake, alert, oriented x 3 and in no acute distress.  Dermatology: Skin is warm and dry bilateral with dry crusted over wound/scab at the left third toe nailbed unchanged from prior.  There is a pinpoint dark lesion noted to the left hallux at the distal toe.  There is significant  redness across the level of the metatarsals and distal toes that is blanchable in nature with very minimal warmth this redness improves with elevation of the leg likely consistent with a vascular component/dependent rubor as previous with no acute active infection going on no active drainage, no malodor, no other acute signs of infection.   Vascular: Dorsalis Pedis pulse = 1/4 Bilateral faint,  Posterior Tibial pulse = 0/4 Bilateral,  Capillary Fill Time < 5 seconds with dependent rubor  Neurologic: Protective sensation diminished with monofilament.  Musculosketal: There is no pain to palpation to left forefoot but there is some pain to the heel where there is small skin fissures noted on the left like previous.  There is mild hammertoe deformity noted.   No results for input(s): GRAMSTAIN, LABORGA in the last 8760 hours.  Assessment and Plan:  Problem List Items Addressed This Visit   None   Visit Diagnoses    PAD (peripheral artery disease) (HCC)    -  Primary   Relevant Medications   traMADol (ULTRAM) 50 MG tablet   Claudication (HCC)       Relevant Medications   traMADol (ULTRAM) 50 MG tablet   Toe ulcer, left, with unspecified severity (HCC)       Dependent rubor       Smoker       Cellulitis and abscess of toe  of left foot          -Examined patient  -Discussed nature of vascular pain and changes to the left foot -Continue with vascular follow-up scheduled for procedure on 2/18 -Apply Betadine to the toes in a clean sock -Advised patient to keep toes clean and dry to continue with the same -No additional need for antibiotics at this time  -Continue with postoperative shoe -Continue with heel pad and cream to skin fissures at the posterior heel -Advised patient to closely monitor if worsens to return to office or go to ER -Patient to return to office in 3 weeks or sooner problems or issues arise. Asencion Islam, DPM

## 2020-08-30 ENCOUNTER — Other Ambulatory Visit: Payer: Self-pay | Admitting: Radiology

## 2020-08-31 ENCOUNTER — Other Ambulatory Visit (HOSPITAL_COMMUNITY): Payer: Self-pay | Admitting: Interventional Radiology

## 2020-08-31 ENCOUNTER — Encounter (HOSPITAL_COMMUNITY): Payer: Self-pay

## 2020-08-31 ENCOUNTER — Ambulatory Visit (HOSPITAL_COMMUNITY)
Admission: RE | Admit: 2020-08-31 | Discharge: 2020-08-31 | Disposition: A | Discharge status: Home / Self Care | Payer: Medicare HMO | Source: Ambulatory Visit | Attending: Interventional Radiology | Admitting: Interventional Radiology

## 2020-08-31 ENCOUNTER — Other Ambulatory Visit: Payer: Self-pay

## 2020-08-31 DIAGNOSIS — I70245 Atherosclerosis of native arteries of left leg with ulceration of other part of foot: Secondary | ICD-10-CM | POA: Insufficient documentation

## 2020-08-31 DIAGNOSIS — I708 Atherosclerosis of other arteries: Secondary | ICD-10-CM | POA: Diagnosis not present

## 2020-08-31 DIAGNOSIS — Z7984 Long term (current) use of oral hypoglycemic drugs: Secondary | ICD-10-CM | POA: Diagnosis not present

## 2020-08-31 DIAGNOSIS — I739 Peripheral vascular disease, unspecified: Secondary | ICD-10-CM

## 2020-08-31 DIAGNOSIS — I70213 Atherosclerosis of native arteries of extremities with intermittent claudication, bilateral legs: Secondary | ICD-10-CM | POA: Diagnosis not present

## 2020-08-31 DIAGNOSIS — I700 Atherosclerosis of aorta: Secondary | ICD-10-CM | POA: Diagnosis not present

## 2020-08-31 DIAGNOSIS — L97529 Non-pressure chronic ulcer of other part of left foot with unspecified severity: Secondary | ICD-10-CM | POA: Insufficient documentation

## 2020-08-31 DIAGNOSIS — Z79899 Other long term (current) drug therapy: Secondary | ICD-10-CM | POA: Diagnosis not present

## 2020-08-31 HISTORY — PX: IR ANGIOGRAM EXTREMITY BILATERAL: IMG653

## 2020-08-31 HISTORY — PX: IR US GUIDE VASC ACCESS LEFT: IMG2389

## 2020-08-31 HISTORY — PX: IR US GUIDE VASC ACCESS RIGHT: IMG2390

## 2020-08-31 LAB — CBC
HCT: 45.3 % (ref 39.0–52.0)
Hemoglobin: 15.5 g/dL (ref 13.0–17.0)
MCH: 33.3 pg (ref 26.0–34.0)
MCHC: 34.2 g/dL (ref 30.0–36.0)
MCV: 97.4 fL (ref 80.0–100.0)
Platelets: 221 10*3/uL (ref 150–400)
RBC: 4.65 MIL/uL (ref 4.22–5.81)
RDW: 13.6 % (ref 11.5–15.5)
WBC: 9.5 10*3/uL (ref 4.0–10.5)
nRBC: 0 % (ref 0.0–0.2)

## 2020-08-31 LAB — BASIC METABOLIC PANEL
Anion gap: 13 (ref 5–15)
BUN: 16 mg/dL (ref 8–23)
CO2: 21 mmol/L — ABNORMAL LOW (ref 22–32)
Calcium: 9.9 mg/dL (ref 8.9–10.3)
Chloride: 95 mmol/L — ABNORMAL LOW (ref 98–111)
Creatinine, Ser: 1.21 mg/dL (ref 0.61–1.24)
GFR, Estimated: >60 mL/min (ref >60)
Glucose, Bld: 129 mg/dL — ABNORMAL HIGH (ref 70–99)
Potassium: 4.3 mmol/L (ref 3.5–5.1)
Sodium: 129 mmol/L — ABNORMAL LOW (ref 135–145)

## 2020-08-31 LAB — PROTIME-INR
INR: 1 (ref 0.8–1.2)
Prothrombin Time: 13.1 seconds (ref 11.4–15.2)

## 2020-08-31 MED ORDER — LIDOCAINE HCL 1 % IJ SOLN
INTRAMUSCULAR | Status: AC
  Filled 2020-08-31: qty 20

## 2020-08-31 MED ORDER — MIDAZOLAM HCL 2 MG/2ML IJ SOLN
INTRAMUSCULAR | Status: AC | PRN
  Administered 2020-08-31 (×3): 1 mg via INTRAVENOUS

## 2020-08-31 MED ORDER — FENTANYL CITRATE (PF) 100 MCG/2ML IJ SOLN
INTRAMUSCULAR | Status: AC
  Filled 2020-08-31: qty 2

## 2020-08-31 MED ORDER — CEFAZOLIN SODIUM-DEXTROSE 2-4 GM/100ML-% IV SOLN: 2.0000 g | INTRAVENOUS | Status: AC

## 2020-08-31 MED ORDER — MIDAZOLAM HCL 2 MG/2ML IJ SOLN
INTRAMUSCULAR | Status: AC
  Filled 2020-08-31: qty 4

## 2020-08-31 MED ORDER — FENTANYL CITRATE (PF) 100 MCG/2ML IJ SOLN
INTRAMUSCULAR | Status: AC | PRN
  Administered 2020-08-31 (×3): 25 ug via INTRAVENOUS

## 2020-08-31 MED ORDER — LIDOCAINE HCL (PF) 1 % IJ SOLN
INTRAMUSCULAR | Status: AC | PRN
  Administered 2020-08-31: 10 mL

## 2020-08-31 MED ORDER — IODIXANOL 320 MG/ML IV SOLN
100.0000 mL | Freq: Once | INTRAVENOUS | Status: AC | PRN
  Administered 2020-08-31: 79 mL via INTRAVENOUS

## 2020-08-31 MED ORDER — IODIXANOL 320 MG/ML IV SOLN
100.0000 mL | Freq: Once | INTRAVENOUS | Status: AC | PRN
  Administered 2020-08-31: 40 mL via INTRAVENOUS

## 2020-08-31 MED ORDER — CEFAZOLIN SODIUM-DEXTROSE 2-4 GM/100ML-% IV SOLN
INTRAVENOUS | Status: AC
  Administered 2020-08-31: 2 g via INTRAVENOUS
  Filled 2020-08-31: qty 100

## 2020-08-31 MED ORDER — SODIUM CHLORIDE 0.9 % IV SOLN: INTRAVENOUS | Status: DC

## 2020-08-31 NOTE — Procedures (Signed)
Interventional Radiology Procedure Note  Procedure:  Pelvic angiogram  Findings: Please refer to procedural dictation for full description. Bilateral common femoral artery access (8 Fr sheath right, 5 Fr sheath left).  Unable to cross left common iliac high grade stenosis.  No intervention performed.  Angioseal bilaterally.  Complications: None immediate  Estimated Blood Loss: < 10 mL  Recommendations: Flat for 2 hours, head of bed 30 degrees for 2 hours. Discharge home after bedrest complete. Will discuss referral to Vascular Surgery with Dr. Marylene Land.   Marliss Coots, MD Pager: [phone]

## 2020-08-31 NOTE — Progress Notes (Signed)
Up and walked and tolerated well; bilat groins stable, no bleeding or hematoma 

## 2020-08-31 NOTE — Discharge Instructions (Signed)
NO METFORMIN/ GLUCOPHAGE FOR 2 DAYS     Femoral Site Care  This sheet gives you information about how to care for yourself after your procedure. Your health care provider may also give you more specific instructions. If you have problems or questions, contact your health care provider. What can I expect after the procedure? After the procedure, it is common to have:  Bruising that usually fades within 1-2 weeks.  Tenderness at the site. Follow these instructions at home: Wound care  Follow instructions from your health care provider about how to take care of your insertion site. Make sure you: ? Wash your hands with soap and water before you change your bandage (dressing). If soap and water are not available, use hand sanitizer. ? Change your dressing as told by your health care provider. ? Leave stitches (sutures), skin glue, or adhesive strips in place. These skin closures may need to stay in place for 2 weeks or longer. If adhesive strip edges start to loosen and curl up, you may trim the loose edges. Do not remove adhesive strips completely unless your health care provider tells you to do that.  Do not take baths, swim, or use a hot tub until your health care provider approves.  You may shower 24-48 hours after the procedure or as told by your health care provider. ? Gently wash the site with plain soap and water. ? Pat the area dry with a clean towel. ? Do not rub the site. This may cause bleeding.  Do not apply powder or lotion to the site. Keep the site clean and dry.  Check your femoral site every day for signs of infection. Check for: ? Redness, swelling, or pain. ? Fluid or blood. ? Warmth. ? Pus or a bad smell. Activity  For the first 2-3 days after your procedure, or as long as directed: ? Avoid climbing stairs as much as possible. ? Do not squat.  Do not lift anything that is heavier than 10 lb (4.5 kg), or the limit that you are told, until your health care  provider says that it is safe.  Rest as directed. ? Avoid sitting for a long time without moving. Get up to take short walks every 1-2 hours.  Do not drive for 24 hours if you were given a medicine to help you relax (sedative). General instructions  Take over-the-counter and prescription medicines only as told by your health care provider.  Keep all follow-up visits as told by your health care provider. This is important. Contact a health care provider if you have:  A fever or chills.  You have redness, swelling, or pain around your insertion site. Get help right away if:  The catheter insertion area swells very fast.  You pass out.  You suddenly start to sweat or your skin gets clammy.  The catheter insertion area is bleeding, and the bleeding does not stop when you hold steady pressure on the area.  The area near or just beyond the catheter insertion site becomes pale, cool, tingly, or numb. These symptoms may represent a serious problem that is an emergency. Do not wait to see if the symptoms will go away. Get medical help right away. Call your local emergency services (911 in the [place] Do not drive yourself to the hospital. Summary  After the procedure, it is common to have bruising that usually fades within 1-2 weeks.  Check your femoral site every day for signs of infection.  Do not lift   anything that is heavier than 10 lb (4.5 kg), or the limit that you are told, until your health care provider says that it is safe. This information is not intended to replace advice given to you by your health care provider. Make sure you discuss any questions you have with your health care provider. Document Revised: 03/02/2020 Document Reviewed: 03/02/2020 Elsevier Patient Education  2021 [employer]  

## 2020-08-31 NOTE — H&P (Signed)
Chief Complaint: Patient was seen in consultation today for aortogram with angioplasty at the request of Suttle,Dylan J  Supervising Physician: Marliss Coots  Patient Status: [facility] - Out-pt  History of Present Illness: Mark Dickerson is a 75 y.o. male with hx of PAD and ischemic left foot/toe wound. He was seen in consult on 2/1. See consult note by Dr. Elby Showers for details.  He then had a CTA which showed significant aortoiliac disease as well as LE PAD with stenoses. He has been scheduled for aortoiliac angiogram with intent to place bilateral CIA stents today. PMHx, meds, labs, imaging, allergies reviewed. Feels well, no recent fevers, chills, illness. Has been NPO today as directed.    History reviewed. No pertinent past medical history.  Past Surgical History:  Procedure Laterality Date  . IR RADIOLOGIST EVAL & MGMT  08/14/2020    Allergies: Patient has no known allergies.  Medications: Prior to Admission medications   Medication Sig Start Date End Date Taking? Authorizing Provider  acetaminophen (TYLENOL) 500 MG tablet Take 500 mg by mouth every 6 (six) hours as needed for moderate pain.   Yes [provider]  amLODipine (NORVASC) 10 MG tablet Take 10 mg by mouth daily. 04/05/20  Yes [provider]  benazepril (LOTENSIN) 40 MG tablet Take 40 mg by mouth daily. 04/03/20  Yes [provider]  Coenzyme Q10 200 MG capsule Take 200 mg by mouth daily.   Yes [provider]  fenofibrate (TRICOR) 145 MG tablet Take 145 mg by mouth daily. 07/11/20  Yes [provider]  hydrochlorothiazide (HYDRODIURIL) 25 MG tablet Take 25 mg by mouth daily. 05/15/20  Yes [provider]  metFORMIN (GLUCOPHAGE-XR) 500 MG 24 hr tablet Take 500 mg by mouth 2 (two) times daily. 05/17/20  Yes [provider]  pravastatin (PRAVACHOL) 20 MG tablet Take 20 mg by mouth daily. 07/09/20  Yes [provider]  SYMBICORT 160-4.5 MCG/ACT inhaler  Inhale 2 puffs into the lungs 2 (two) times daily as needed (shortness of breath). 08/01/20  Yes [provider]  Vitamin D, Ergocalciferol, (DRISDOL) 1.25 MG (50000 UNIT) CAPS capsule Take 50,000 Units by mouth once a week. 08/01/20  Yes [provider]  neomycin-polymyxin-hydrocortisone (CORTISPORIN) OTIC solution Apply 1-2 drops in between toes twice a day Patient not taking: Reported on 08/28/2020 08/03/20   Asencion Islam, DPM     History reviewed. No pertinent family history.  Social History   Socioeconomic History  . Marital status: Single    Spouse name: Not on file  . Number of children: Not on file  . Years of education: Not on file  . Highest education level: Not on file  Occupational History  . Not on file  Tobacco Use  . Smoking status: Unknown If Ever Smoked  . Smokeless tobacco: Never Used  Substance and Sexual Activity  . Alcohol use: Not on file  . Drug use: Not on file  . Sexual activity: Not on file  Other Topics Concern  . Not on file  Social History Narrative  . Not on file   Social Determinants of Health   Financial Resource Strain: Not on file  Food Insecurity: Not on file  Transportation Needs: Not on file  Physical Activity: Not on file  Stress: Not on file  Social Connections: Not on file    Review of Systems: A 12 point ROS discussed and pertinent positives are indicated in the HPI above.  All other systems are negative.  Review  of Systems  Vital Signs: BP (!) 150/75   Pulse 80   Temp 97.9 F (36.6 C) (Oral)   Resp 16   Ht 5\' 11"  (1.803 m)   Wt 90.7 kg   SpO2 96%   BMI 27.89 kg/m   Physical Exam Constitutional:      Appearance: Normal appearance. He is not ill-appearing.  HENT:     Mouth/Throat:     Mouth: Mucous membranes are moist.     Pharynx: Oropharynx is clear.  Cardiovascular:     Rate and Rhythm: Normal rate and regular rhythm.     Pulses: Normal pulses.     Heart sounds: Normal heart sounds.   Pulmonary:     Effort: Pulmonary effort is normal. No respiratory distress.     Breath sounds: Normal breath sounds.  Abdominal:     General: Abdomen is flat.     Palpations: Abdomen is soft.  Skin:    General: Skin is warm and dry.  Neurological:     General: No focal deficit present.     Mental Status: He is alert and oriented to person, place, and time.  Psychiatric:        Mood and Affect: Mood normal.        Thought Content: Thought content normal.        Judgment: Judgment normal.     Labs:  CBC: Recent Labs    08/31/20 1055  WBC 9.5  HGB 15.5  HCT 45.3  PLT 221    COAGS: No results for input(s): INR, APTT in the last 8760 hours.  BMP: No results for input(s): NA, K, CL, CO2, GLUCOSE, BUN, CALCIUM, CREATININE, GFRNONAA, GFRAA in the last 8760 hours.  Invalid input(s): CMP  LIVER FUNCTION TESTS: No results for input(s): BILITOT, AST, ALT, ALKPHOS, PROT, ALBUMIN in the last 8760 hours.  TUMOR MARKERS: No results for input(s): AFPTM, CEA, CA199, CHROMGRNA in the last 8760 hours.  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 Risks and benefits of angiogram with stent angioplasty were discussed with the patient including, but not limited to bleeding, infection, vascular injury or contrast induced renal failure.  This interventional procedure involves the use of X-rays and because of the nature of the planned procedure, it is possible that we will have prolonged use of X-ray fluoroscopy.  Potential radiation risks to you include (but are not limited to) the following: - A slightly elevated risk for cancer  several years later in life. This risk is typically less than 0.5% percent. This risk is low in comparison to the normal incidence of human cancer, which is 33% for women and 50% for men according to the American  Cancer Society. - Radiation induced injury can include skin redness, resembling a rash, tissue breakdown / ulcers and hair loss (which can be temporary or permanent).   The likelihood of either of these occurring depends on the difficulty of the procedure and whether you are sensitive to radiation due to previous procedures, disease, or genetic conditions.   IF your procedure requires a prolonged use of radiation, you will be notified and given written instructions for further action.  It is your responsibility to monitor the irradiated area for the 2 weeks following the procedure and to notify your physician if you are concerned that you have suffered a radiation induced injury.    All of the patient's questions were  answered, patient is agreeable to proceed.  Consent signed and in chart.    Thank you for this interesting consult.  I greatly enjoyed meeting Abrahim Sargent and look forward to participating in their care.  A copy of this report was sent to the requesting provider on this date.  Electronically Signed: Brayton El, PA-C 08/31/2020, 12:04 PM   I spent a total of 25 minutes in face to face in clinical consultation, greater than 50% of which was counseling/coordinating care for iliac stent angioplasty

## 2020-09-01 ENCOUNTER — Other Ambulatory Visit: Payer: Self-pay | Admitting: Interventional Radiology

## 2020-09-01 DIAGNOSIS — I7409 Other arterial embolism and thrombosis of abdominal aorta: Secondary | ICD-10-CM

## 2020-09-06 ENCOUNTER — Telehealth: Payer: Self-pay | Admitting: *Deleted

## 2020-09-06 ENCOUNTER — Other Ambulatory Visit: Payer: Self-pay | Admitting: Interventional Radiology

## 2020-09-06 NOTE — Telephone Encounter (Signed)
Called pt to schedule f/u with Dr. Elby Showers pt daughter states he has been referred to Vascular Surgery and will not be following Dr. Elby Showers. She did state if he needs to come back she will have him here./vm

## 2020-09-07 ENCOUNTER — Encounter: Payer: Self-pay | Admitting: Sports Medicine

## 2020-09-07 ENCOUNTER — Ambulatory Visit: Payer: Medicare HMO | Admitting: Sports Medicine

## 2020-09-07 ENCOUNTER — Other Ambulatory Visit: Payer: Self-pay

## 2020-09-07 DIAGNOSIS — I70229 Atherosclerosis of native arteries of extremities with rest pain, unspecified extremity: Secondary | ICD-10-CM

## 2020-09-07 DIAGNOSIS — L97529 Non-pressure chronic ulcer of other part of left foot with unspecified severity: Secondary | ICD-10-CM

## 2020-09-07 DIAGNOSIS — I739 Peripheral vascular disease, unspecified: Secondary | ICD-10-CM | POA: Diagnosis not present

## 2020-09-07 DIAGNOSIS — L601 Onycholysis: Secondary | ICD-10-CM | POA: Diagnosis not present

## 2020-09-07 MED ORDER — AMOXICILLIN-POT CLAVULANATE 875-125 MG PO TABS: 1.0000 | ORAL_TABLET | Freq: Two times a day (BID) | ORAL | 0 refills | Status: DC

## 2020-09-07 MED ORDER — HYDROCODONE-ACETAMINOPHEN 10-325 MG PO TABS: 1.0000 | ORAL_TABLET | Freq: Four times a day (QID) | ORAL | 0 refills | Status: AC | PRN

## 2020-09-07 NOTE — Progress Notes (Signed)
Subjective: Mark Dickerson is a 75 y.o. male patient returns office for follow-up evaluation of left foot cellulitis and wound at left third toe.  Reports that the left first toe has started to ooze and drain and is really really painful and states that he has been taking a lot of Tylenol because the tramadol that I gave him did not help as well with his pain.  Patient has went for vascular intervention and had to be rescheduled with a another doctor because Dr. Elby Showers could not do the procedure.  Patient is assisted by grandson with daughter on the phone.  No other pedal complaints noted.  There are no problems to display for this patient.  Current Outpatient Medications on File Prior to Visit  Medication Sig Dispense Refill  . acetaminophen (TYLENOL) 500 MG tablet Take 500 mg by mouth every 6 (six) hours as needed for moderate pain.    Marland Kitchen amLODipine (NORVASC) 10 MG tablet Take 10 mg by mouth daily.    . benazepril (LOTENSIN) 40 MG tablet Take 40 mg by mouth daily.    . Coenzyme Q10 200 MG capsule Take 200 mg by mouth daily.    . fenofibrate (TRICOR) 145 MG tablet Take 145 mg by mouth daily.    . hydrochlorothiazide (HYDRODIURIL) 25 MG tablet Take 25 mg by mouth daily.    . metFORMIN (GLUCOPHAGE-XR) 500 MG 24 hr tablet Take 500 mg by mouth 2 (two) times daily.    Marland Kitchen neomycin-polymyxin-hydrocortisone (CORTISPORIN) OTIC solution Apply 1-2 drops in between toes twice a day (Patient not taking: Reported on 08/28/2020) 10 mL 0  . pravastatin (PRAVACHOL) 20 MG tablet Take 20 mg by mouth daily.    . SYMBICORT 160-4.5 MCG/ACT inhaler Inhale 2 puffs into the lungs 2 (two) times daily as needed (shortness of breath).    . Vitamin D, Ergocalciferol, (DRISDOL) 1.25 MG (50000 UNIT) CAPS capsule Take 50,000 Units by mouth once a week.     No current facility-administered medications on file prior to visit.   No Known Allergies  Recent Results (from the past 2160 hour(s))  CBC     Status: None   Collection Time:  08/31/20 10:55 AM  Result Value Ref Range   WBC 9.5 4.0 - 10.5 K/uL   RBC 4.65 4.22 - 5.81 MIL/uL   Hemoglobin 15.5 13.0 - 17.0 g/dL   HCT 28.4 13.2 - 44.0 %   MCV 97.4 80.0 - 100.0 fL   MCH 33.3 26.0 - 34.0 pg   MCHC 34.2 30.0 - 36.0 g/dL   RDW 10.2 72.5 - 36.6 %   Platelets 221 150 - 400 K/uL   nRBC 0.0 0.0 - 0.2 %    Comment: Performed at [facility], [address], [place], [name] [address]  Protime-INR     Status: None   Collection Time: 08/31/20 10:55 AM  Result Value Ref Range   Prothrombin Time 13.1 11.4 - 15.2 seconds   INR 1.0 0.8 - 1.2    Comment: (NOTE) INR goal varies based on device and disease states. Performed at [facility], [address], [place], [name] [address]   Basic metabolic panel     Status: Abnormal   Collection Time: 08/31/20 10:55 AM  Result Value Ref Range   Sodium 129 (L) 135 - 145 mmol/L   Potassium 4.3 3.5 - 5.1 mmol/L   Chloride 95 (L) 98 - 111 mmol/L   CO2 21 (L) 22 - 32 mmol/L  Glucose, Bld 129 (H) 70 - 99 mg/dL    Comment: Glucose reference range applies only to samples taken after fasting for at least 8 hours.   BUN 16 8 - 23 mg/dL   Creatinine, Ser 0.27 0.61 - 1.24 mg/dL   Calcium 9.9 8.9 - 74.1 mg/dL   GFR, Estimated >28 >78 mL/min    Comment: (NOTE) Calculated using the CKD-EPI Creatinine Equation (2021)    Anion gap 13 5 - 15    Comment: Performed at [facility], [address], [place], [name] [address]    Objective: There were no vitals filed for this visit.  General: Patient is awake, alert, oriented x 3 and in no acute distress.  Dermatology: Skin is warm and dry bilateral with dry crusted over wound/scab at the left third toe nailbed unchanged from prior.  There is lifting of the left hallux nail and significant clear drainage noted which is new from prior once the nail was removed there was a small amount of granular and macerated tissue in the nailbed.  There is significant redness across the  level of the metatarsals and distal toes that is blanchable in nature with very minimal warmth this redness improves with elevation of the leg likely consistent with a vascular component/dependent rubor as previous with no acute active infection going on no active drainage, no malodor, no other acute signs of infection.   Vascular: Dorsalis Pedis pulse = 1/4 Bilateral faint,  Posterior Tibial pulse = 0/4 Bilateral,  Capillary Fill Time < 5 seconds with dependent rubor  Neurologic: Protective sensation diminished with monofilament.  Musculosketal: There is no pain to palpation to left forefoot but there is some pain to the heel where there is small skin fissures noted on the left like previous.  There is pain also at the lower leg and calf.  Patient reports that pain is worse at night relieved with dangling the leg or moving around.  There is mild hammertoe deformity noted.   No results for input(s): GRAMSTAIN, LABORGA in the last 8760 hours.  Assessment and Plan:  Problem List Items Addressed This Visit   None   Visit Diagnoses    Onycholysis    -  Primary   Toe ulcer, left, with unspecified severity (HCC)       PAD (peripheral artery disease) (HCC)       Claudication (HCC)       Relevant Medications   HYDROcodone-acetaminophen (NORCO) 10-325 MG tablet   Critical lower limb ischemia (HCC)          -Examined patient  -Discussed nature of vascular pain and changes to the left foot -Prescribed hydrocodone for patient to have at this time only to see if this will give him some relief from his pain which I believe is more vascular in nature -Continue with vascular follow-up scheduled for procedure on 3/8 -Using a sterile nail nipper to remove the lifting and loose nail at the left hallux -Applied Betadine to the toes and a Band-Aid to the left first toe with a clean sock -Advised patient to keep toes clean and dry to continue with the same -Prescribed Augmentin for patient to take as  instructed -Continue with postoperative shoe -Continue with heel pad and cream to skin fissures at the posterior heel like previous-Advised patient to closely monitor if worsens to return to office or go to ER -Patient to return to office in 2-3 weeks after vascular procedure or sooner problems or issues arise. Zaineb Nowaczyk  Cannon Kettle, DPM

## 2020-09-11 ENCOUNTER — Other Ambulatory Visit: Payer: Self-pay

## 2020-09-11 DIAGNOSIS — I739 Peripheral vascular disease, unspecified: Secondary | ICD-10-CM

## 2020-09-12 ENCOUNTER — Telehealth: Payer: Self-pay

## 2020-09-12 NOTE — Telephone Encounter (Signed)
Pt's daughter called and LVM stating they have noticed a black spot on the patient's heel. Daughter states pt has made an appt to see Dr. Blase Mess to evaluate the ulcer, but they would like to get an appt. Sooner so something could be done to the wound. Please advice

## 2020-09-12 NOTE — Telephone Encounter (Signed)
Mark Dickerson  Can you call patient, I can see him on Friday at 215pm for a wound check. Meanwhile they can use betadine to the new spot/wound. Thanks Dr. Kathie Rhodes

## 2020-09-14 ENCOUNTER — Ambulatory Visit: Payer: Medicare HMO | Admitting: Sports Medicine

## 2020-09-14 ENCOUNTER — Other Ambulatory Visit: Payer: Self-pay

## 2020-09-14 ENCOUNTER — Encounter: Payer: Self-pay | Admitting: Sports Medicine

## 2020-09-14 DIAGNOSIS — L97529 Non-pressure chronic ulcer of other part of left foot with unspecified severity: Secondary | ICD-10-CM | POA: Diagnosis not present

## 2020-09-14 DIAGNOSIS — I70229 Atherosclerosis of native arteries of extremities with rest pain, unspecified extremity: Secondary | ICD-10-CM | POA: Diagnosis not present

## 2020-09-14 DIAGNOSIS — L539 Erythematous condition, unspecified: Secondary | ICD-10-CM | POA: Diagnosis not present

## 2020-09-14 DIAGNOSIS — I739 Peripheral vascular disease, unspecified: Secondary | ICD-10-CM

## 2020-09-14 NOTE — Progress Notes (Signed)
Subjective: Mark Dickerson is a 75 y.o. male patient returns office for follow-up evaluation of left foot cellulitis and wound at left third toe and great toe and reports that his heel has started to crack open a little bit more so this week he started using Betadine to the heel. Patient denies nausea vomiting fever chills and reports that his pain is doing better after having the hydrocodone. No other pedal complaints noted.  There are no problems to display for this patient.  Current Outpatient Medications on File Prior to Visit  Medication Sig Dispense Refill  . acetaminophen (TYLENOL) 500 MG tablet Take 500 mg by mouth every 6 (six) hours as needed for moderate pain.    Marland Kitchen amLODipine (NORVASC) 10 MG tablet Take 10 mg by mouth daily.    Marland Kitchen amoxicillin-clavulanate (AUGMENTIN) 875-125 MG tablet Take 1 tablet by mouth 2 (two) times daily. 28 tablet 0  . benazepril (LOTENSIN) 40 MG tablet Take 40 mg by mouth daily.    . Coenzyme Q10 200 MG capsule Take 200 mg by mouth daily.    . fenofibrate (TRICOR) 145 MG tablet Take 145 mg by mouth daily.    . hydrochlorothiazide (HYDRODIURIL) 25 MG tablet Take 25 mg by mouth daily.    Marland Kitchen HYDROcodone-acetaminophen (NORCO) 10-325 MG tablet Take 1 tablet by mouth every 6 (six) hours as needed for up to 7 days. 28 tablet 0  . metFORMIN (GLUCOPHAGE-XR) 500 MG 24 hr tablet Take 500 mg by mouth 2 (two) times daily.    Marland Kitchen neomycin-polymyxin-hydrocortisone (CORTISPORIN) OTIC solution Apply 1-2 drops in between toes twice a day (Patient not taking: Reported on 08/28/2020) 10 mL 0  . pravastatin (PRAVACHOL) 20 MG tablet Take 20 mg by mouth daily.    . SYMBICORT 160-4.5 MCG/ACT inhaler Inhale 2 puffs into the lungs 2 (two) times daily as needed (shortness of breath).    . Vitamin D, Ergocalciferol, (DRISDOL) 1.25 MG (50000 UNIT) CAPS capsule Take 50,000 Units by mouth once a week.     No current facility-administered medications on file prior to visit.   No Known  Allergies  Recent Results (from the past 2160 hour(s))  CBC     Status: None   Collection Time: 08/31/20 10:55 AM  Result Value Ref Range   WBC 9.5 4.0 - 10.5 K/uL   RBC 4.65 4.22 - 5.81 MIL/uL   Hemoglobin 15.5 13.0 - 17.0 g/dL   HCT 37.1 69.6 - 78.9 %   MCV 97.4 80.0 - 100.0 fL   MCH 33.3 26.0 - 34.0 pg   MCHC 34.2 30.0 - 36.0 g/dL   RDW 38.1 01.7 - 51.0 %   Platelets 221 150 - 400 K/uL   nRBC 0.0 0.0 - 0.2 %    Comment: Performed at [facility], [address], [place], [name] [address]  Protime-INR     Status: None   Collection Time: 08/31/20 10:55 AM  Result Value Ref Range   Prothrombin Time 13.1 11.4 - 15.2 seconds   INR 1.0 0.8 - 1.2    Comment: (NOTE) INR goal varies based on device and disease states. Performed at [facility], [address], [place], [name] [address]   Basic metabolic panel     Status: Abnormal   Collection Time: 08/31/20 10:55 AM  Result Value Ref Range   Sodium 129 (L) 135 - 145 mmol/L   Potassium 4.3 3.5 - 5.1 mmol/L   Chloride 95 (L) 98 - 111 mmol/L  CO2 21 (L) 22 - 32 mmol/L   Glucose, Bld 129 (H) 70 - 99 mg/dL    Comment: Glucose reference range applies only to samples taken after fasting for at least 8 hours.   BUN 16 8 - 23 mg/dL   Creatinine, Ser 5.42 0.61 - 1.24 mg/dL   Calcium 9.9 8.9 - 70.6 mg/dL   GFR, Estimated >23 >76 mL/min    Comment: (NOTE) Calculated using the CKD-EPI Creatinine Equation (2021)    Anion gap 13 5 - 15    Comment: Performed at [facility], [address], [place], [name] [address]    Objective: There were no vitals filed for this visit.  General: Patient is awake, alert, oriented x 3 and in no acute distress.  Dermatology: Skin is warm and dry bilateral with dry crusted over wound/scab at the left third toe nailbed unchanged from prior.  There is dry crusted skin at the nailbed of the left first toe that has now dried into an eschar. There is fissures noted to the heel eschars to  these areas with no surrounding redness swelling warmth or drainage. Patient has purple discoloration to all toes of the left foot.   Vascular: Dorsalis Pedis pulse = 1/4 Bilateral faint,  Posterior Tibial pulse = 0/4 Bilateral,  Capillary Fill Time < 5 seconds with dependent rubor, decreased temperature gradient.  Neurologic: Protective sensation diminished with monofilament.  Musculosketal: There is no pain to palpation to left forefoot but there is some pain to the heel where there is small skin fissures noted on the left like previous.  There is decrease pain also at the lower leg and calf.     No results for input(s): GRAMSTAIN, LABORGA in the last 8760 hours.  Assessment and Plan:  Problem List Items Addressed This Visit   None   Visit Diagnoses    Toe ulcer, left, with unspecified severity (HCC)    -  Primary   PAD (peripheral artery disease) (HCC)       Claudication (HCC)       Critical lower limb ischemia (HCC)       Dependent rubor         -Examined patient  -Discussed nature of vascular pain and changes to the left foot -Applied Betadine to all areas of the left foot and advised patient that since the left first toe nailbed is dry do not need Band-Aid to this area as long as he waits about 5 minutes before putting on his socks to ensure that the Betadine medicine has dried -Continue with surgical shoe offloading pad added to the ball of the foot as well as to the heel -Continue with vascular follow-up scheduled for procedure on 3/8 -Continue with hydrocodone as needed for pain -Continue with Augmentin until completed -Advised patient to monitor and return to office or go to ER if things worsen -Patient to return to office after vascular procedure or sooner problems or issues arise. Asencion Islam, DPM

## 2020-09-18 ENCOUNTER — Ambulatory Visit (INDEPENDENT_AMBULATORY_CARE_PROVIDER_SITE_OTHER)
Admission: RE | Admit: 2020-09-18 | Discharge: 2020-09-18 | Disposition: A | Discharge status: Home / Self Care | Payer: Medicare HMO | Source: Ambulatory Visit | Attending: Vascular Surgery | Admitting: Vascular Surgery

## 2020-09-18 ENCOUNTER — Encounter: Payer: Self-pay | Admitting: Vascular Surgery

## 2020-09-18 ENCOUNTER — Other Ambulatory Visit: Payer: Self-pay

## 2020-09-18 ENCOUNTER — Ambulatory Visit: Payer: Medicare HMO | Admitting: Vascular Surgery

## 2020-09-18 VITALS — BP 107/68 | HR 81 | Temp 97.9°F | Resp 20 | Ht 71.0 in | Wt 189.0 lb

## 2020-09-18 DIAGNOSIS — I70262 Atherosclerosis of native arteries of extremities with gangrene, left leg: Secondary | ICD-10-CM | POA: Diagnosis not present

## 2020-09-18 DIAGNOSIS — I739 Peripheral vascular disease, unspecified: Secondary | ICD-10-CM

## 2020-09-18 NOTE — Progress Notes (Addendum)
[facility] [place]  ASSESSMENT / PLAN: Mark Dickerson is a 75 y.o. male with atherosclerosis of native arteries of left lower extremity causing gangrene.  Patient counseled patients with chronic limb threatening ischemia have an annual risk of cardiovascular mortality of 25% and a annual amputation risk of 25%. Aggressive risk factor modification and intervention for limb salvage is indicated..  Recommend the following which can slow the progression of atherosclerosis and reduce the risk of major adverse cardiac / limb events:  Complete cessation from all tobacco products. Blood glucose control with goal A1c < 7%. Blood pressure control with goal blood pressure < 140/90 mmHg. Lipid reduction therapy with goal LDL-C <100 mg/dL (<16 if symptomatic from PAD).  Aspirin 81mg  PO QD.  Atorvastatin 40-80mg  PO QD (or other "high intensity" statin therapy).  Unfortunate situation.  Patient's previous angiogram is reviewed.  He has severe proximal common iliac stenosis bilaterally.  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  Unfortunately his previous angiograms do not image the runoff of the left lower extremity so I cannot tell if he has a distal bypass target.  Plan left lower extremity angiogram via right common femoral artery approach in cath lab to evaluate his left lower extremity runoff runoff.  I will plan admit the patient postoperatively to accelerate his work-up.  We will tentatively plan for an aortobifemoral bypass, left common femoral endarterectomy, left femoral to distal bypass 09/24/2020 unless I can find OR space to do it sooner.  He will need a stress test, vein mapping, etc.  I will try to obtain a copy of his CTA preoperative planning.  CHIEF COMPLAINT: Gangrene left foot  HISTORY OF PRESENT ILLNESS: Mark Dickerson is a 75 y.o. male who presents to the office for evaluation of left foot  gangrene.  He was evaluated by Dr. 66 in late January.  At that time he had rubor and gangrene of the tip of the left third toe.  He was referred to Dr. February of interventional radiology for an angiogram.  Dr. Elby Showers obtained a CTA which showed severe common iliac artery stenosis.  Unfortunately these images are not available for me to review in epic.  Dr. Elby Showers attempted to treat his iliac stenosis with stenting 08/31/2020.  He encountered an occluded left common femoral artery and could not complete his procedure.  The patient was ultimately referred to vascular surgery  Today the patient has gangrene of the left great toe, left third toe, and heel.  Reports disabling claudication over the past several months.  He and his daughter are understandably frustrated with the situation.    VASCULAR SURGICAL HISTORY: None  VASCULAR RISK FACTORS: Patient reports: - No history of cerebrovascular disease / stroke / transient ischemic attack. - No history of coronary artery disease.  - + history of diabetes mellitus (no recent A1c) - + history of smoking.  - + history of hypertension.  - No history of chronic kidney disease  - + history of chronic obstructive pulmonary disease.  Past Medical History:  Diagnosis Date  . Diabetes mellitus without complication (HCC)   . Hyperlipidemia   . Hypertension     Past Surgical History:  Procedure Laterality Date  . IR ANGIOGRAM EXTREMITY BILATERAL  08/31/2020  . IR RADIOLOGIST EVAL & MGMT  08/14/2020  . IR 10/12/2020 GUIDE VASC ACCESS LEFT  08/31/2020  . IR 09/02/2020 GUIDE VASC ACCESS RIGHT  08/31/2020    History reviewed. No pertinent family history.  Social History   Socioeconomic History  . Marital status: Single    Spouse name: Not on file  . Number of children: Not on file  . Years of education: Not on file  . Highest education level: Not on file  Occupational History  . Not on file  Tobacco Use  . Smoking status: Current Every Day Smoker    Packs/day:  0.50    Types: Cigarettes  . Smokeless tobacco: Never Used  Vaping Use  . Vaping Use: Never used  Substance and Sexual Activity  . Alcohol use: Not Currently  . Drug use: Not on file  . Sexual activity: Not on file  Other Topics Concern  . Not on file  Social History Narrative  . Not on file   Social Determinants of Health   Financial Resource Strain: Not on file  Food Insecurity: Not on file  Transportation Needs: Not on file  Physical Activity: Not on file  Stress: Not on file  Social Connections: Not on file  Intimate Partner Violence: Not on file    No Known Allergies  Current Outpatient Medications  Medication Sig Dispense Refill  . acetaminophen (TYLENOL) 500 MG tablet Take 500 mg by mouth every 6 (six) hours as needed for moderate pain.    . amLODipine (NORVASC) 10 MG tablet Take 10 mg by mouth daily.    . amoxicillin-clavulanate (AUGMENTIN) 875-125 MG tablet Take 1 tablet by mouth 2 (two) times daily. 28 tablet 0  . benazepril (LOTENSIN) 40 MG tablet Take 40 mg by mouth daily.    . Coenzyme Q10 200 MG capsule Take 200 mg by mouth daily.    . fenofibrate (TRICOR) 145 MG tablet Take 145 mg by mouth daily.    . hydrochlorothiazide (HYDRODIURIL) 25 MG tablet Take 25 mg by mouth daily.    . metFORMIN (GLUCOPHAGE-XR) 500 MG 24 hr tablet Take 500 mg by mouth 2 (two) times daily.    . pravastatin (PRAVACHOL) 20 MG tablet Take 20 mg by mouth daily.    . SYMBICORT 160-4.5 MCG/ACT inhaler Inhale 2 puffs into the lungs 2 (two) times daily as needed (shortness of breath).    . Vitamin D, Ergocalciferol, (DRISDOL) 1.25 MG (50000 UNIT) CAPS capsule Take 50,000 Units by mouth once a week.    . neomycin-polymyxin-hydrocortisone (CORTISPORIN) OTIC solution Apply 1-2 drops in between toes twice a day (Patient not taking: Reported on 09/18/2020) 10 mL 0   No current facility-administered medications for this visit.    REVIEW OF SYSTEMS:  [X] denotes positive finding, [ ] denotes  negative finding Cardiac  Comments:  Chest pain or chest pressure:    Shortness of breath upon exertion:    Short of breath when lying flat:    Irregular heart rhythm:        Vascular    Pain in calf, thigh, or hip brought on by ambulation: x   Pain in feet at night that wakes you up from your sleep:  x   Blood clot in your veins:    Leg swelling:         Pulmonary    Oxygen at home:    Productive cough:     Wheezing:         Neurologic    Sudden weakness in arms or legs:     Sudden numbness in arms or legs:     Sudden onset of difficulty speaking or slurred   speech:    Temporary loss of vision in one eye:     Problems with dizziness:         Gastrointestinal    Blood in stool:     Vomited blood:         Genitourinary    Burning when urinating:     Blood in urine:        Psychiatric    Major depression:         Hematologic    Bleeding problems:    Problems with blood clotting too easily:        Skin    Rashes or ulcers: x       Constitutional    Fever or chills:      PHYSICAL EXAM  Vitals:   09/18/20 1402  BP: 107/68  Pulse: 81  Resp: 20  Temp: 97.9 F (36.6 C)  SpO2: 100%  Weight: 189 lb (85.7 kg)  Height: 5\' 11"  (1.803 m)    Constitutional: chronically ill appearing. No distress. Appears well nourished.  Neurologic: CN intact. no focal findings. No sensory loss. Psychiatric: Mood and affect symmetric and appropriate. Eyes: No icterus. No conjunctival pallor. Ears, nose, throat: mucous membranes moist. Midline trachea.  Cardiac: regular rate and rhythm.  Respiratory: unlabored. Abdominal: soft, non-tender, non-distended.  Peripheral vascular:  No palpable L femoral pulse  1+ R femoral pulse  No palpable pedal pulses  Gangrene of left great toe, left third toe, heel Extremity: No edema. No cyanosis. No pallor.  Skin: No gangrene. No ulceration.  Lymphatic: No Stemmer's sign. No palpable lymphadenopathy.  PERTINENT LABORATORY AND RADIOLOGIC  DATA  Most recent CBC CBC Latest Ref Rng & Units 08/31/2020  WBC 4.0 - 10.5 K/uL 9.5  Hemoglobin 13.0 - 17.0 g/dL 09/02/2020  Hematocrit 92.3 - 52.0 % 45.3  Platelets 150 - 400 K/uL 221     Most recent CMP CMP Latest Ref Rng & Units 08/31/2020  Glucose 70 - 99 mg/dL 09/02/2020)  BUN 8 - 23 mg/dL 16  Creatinine 762(U - 6.33 mg/dL 3.54  Sodium 5.62 - 563 mmol/L 129(L)  Potassium 3.5 - 5.1 mmol/L 4.3  Chloride 98 - 111 mmol/L 95(L)  CO2 22 - 32 mmol/L 21(L)  Calcium 8.9 - 10.3 mg/dL 9.9   I personally reviewed Dr. 893 angiogram from 08/31/2020 ABI 09/18/2020 Right 0.5.  Toe pressure 44 Left 0.3.  Toe pressure 0  11/18/2020. Rande Brunt, MD [facility] Phone Number: [phone] 09/18/2020 5:04 PM

## 2020-09-18 NOTE — H&P (View-Only) (Signed)
[facility] [place]  ASSESSMENT / PLAN: Mark Dickerson is a 76 y.o. male with atherosclerosis of native arteries of left lower extremity causing gangrene.  Patient counseled patients with chronic limb threatening ischemia have an annual risk of cardiovascular mortality of 25% and a annual amputation risk of 25%. Aggressive risk factor modification and intervention for limb salvage is indicated..  Recommend the following which can slow the progression of atherosclerosis and reduce the risk of major adverse cardiac / limb events:  Complete cessation from all tobacco products. Blood glucose control with goal A1c < 7%. Blood pressure control with goal blood pressure < 140/90 mmHg. Lipid reduction therapy with goal LDL-C <100 mg/dL (<16 if symptomatic from PAD).  Aspirin 81mg  PO QD.  Atorvastatin 40-80mg  PO QD (or other "high intensity" statin therapy).  Unfortunate situation.  Patient's previous angiogram is reviewed.  He has severe proximal common iliac stenosis bilaterally.  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  Unfortunately his previous angiograms do not image the runoff of the left lower extremity so I cannot tell if he has a distal bypass target.  Plan left lower extremity angiogram via right common femoral artery approach in cath lab to evaluate his left lower extremity runoff runoff.  I will plan admit the patient postoperatively to accelerate his work-up.  We will tentatively plan for an aortobifemoral bypass, left common femoral endarterectomy, left femoral to distal bypass 09/24/2020 unless I can find OR space to do it sooner.  He will need a stress test, vein mapping, etc.  I will try to obtain a copy of his CTA preoperative planning.  CHIEF COMPLAINT: Gangrene left foot  HISTORY OF PRESENT ILLNESS: Mark Dickerson is a 75 y.o. male who presents to the office for evaluation of left foot  gangrene.  He was evaluated by Dr. 66 in late January.  At that time he had rubor and gangrene of the tip of the left third toe.  He was referred to Dr. February of interventional radiology for an angiogram.  Dr. Elby Showers obtained a CTA which showed severe common iliac artery stenosis.  Unfortunately these images are not available for me to review in epic.  Dr. Elby Showers attempted to treat his iliac stenosis with stenting 08/31/2020.  He encountered an occluded left common femoral artery and could not complete his procedure.  The patient was ultimately referred to vascular surgery  Today the patient has gangrene of the left great toe, left third toe, and heel.  Reports disabling claudication over the past several months.  He and his daughter are understandably frustrated with the situation.    VASCULAR SURGICAL HISTORY: None  VASCULAR RISK FACTORS: Patient reports: - No history of cerebrovascular disease / stroke / transient ischemic attack. - No history of coronary artery disease.  - + history of diabetes mellitus (no recent A1c) - + history of smoking.  - + history of hypertension.  - No history of chronic kidney disease  - + history of chronic obstructive pulmonary disease.  Past Medical History:  Diagnosis Date  . Diabetes mellitus without complication (HCC)   . Hyperlipidemia   . Hypertension     Past Surgical History:  Procedure Laterality Date  . IR ANGIOGRAM EXTREMITY BILATERAL  08/31/2020  . IR RADIOLOGIST EVAL & MGMT  08/14/2020  . IR 10/12/2020 GUIDE VASC ACCESS LEFT  08/31/2020  . IR 09/02/2020 GUIDE VASC ACCESS RIGHT  08/31/2020    History reviewed. No pertinent family history.  Social History   Socioeconomic History  . Marital status: Single    Spouse name: Not on file  . Number of children: Not on file  . Years of education: Not on file  . Highest education level: Not on file  Occupational History  . Not on file  Tobacco Use  . Smoking status: Current Every Day Smoker    Packs/day:  0.50    Types: Cigarettes  . Smokeless tobacco: Never Used  Vaping Use  . Vaping Use: Never used  Substance and Sexual Activity  . Alcohol use: Not Currently  . Drug use: Not on file  . Sexual activity: Not on file  Other Topics Concern  . Not on file  Social History Narrative  . Not on file   Social Determinants of Health   Financial Resource Strain: Not on file  Food Insecurity: Not on file  Transportation Needs: Not on file  Physical Activity: Not on file  Stress: Not on file  Social Connections: Not on file  Intimate Partner Violence: Not on file    No Known Allergies  Current Outpatient Medications  Medication Sig Dispense Refill  . acetaminophen (TYLENOL) 500 MG tablet Take 500 mg by mouth every 6 (six) hours as needed for moderate pain.    Marland Kitchen amLODipine (NORVASC) 10 MG tablet Take 10 mg by mouth daily.    Marland Kitchen amoxicillin-clavulanate (AUGMENTIN) 875-125 MG tablet Take 1 tablet by mouth 2 (two) times daily. 28 tablet 0  . benazepril (LOTENSIN) 40 MG tablet Take 40 mg by mouth daily.    . Coenzyme Q10 200 MG capsule Take 200 mg by mouth daily.    . fenofibrate (TRICOR) 145 MG tablet Take 145 mg by mouth daily.    . hydrochlorothiazide (HYDRODIURIL) 25 MG tablet Take 25 mg by mouth daily.    . metFORMIN (GLUCOPHAGE-XR) 500 MG 24 hr tablet Take 500 mg by mouth 2 (two) times daily.    . pravastatin (PRAVACHOL) 20 MG tablet Take 20 mg by mouth daily.    . SYMBICORT 160-4.5 MCG/ACT inhaler Inhale 2 puffs into the lungs 2 (two) times daily as needed (shortness of breath).    . Vitamin D, Ergocalciferol, (DRISDOL) 1.25 MG (50000 UNIT) CAPS capsule Take 50,000 Units by mouth once a week.    . neomycin-polymyxin-hydrocortisone (CORTISPORIN) OTIC solution Apply 1-2 drops in between toes twice a day (Patient not taking: Reported on 09/18/2020) 10 mL 0   No current facility-administered medications for this visit.    REVIEW OF SYSTEMS:  [X]  denotes positive finding, [ ]  denotes  negative finding Cardiac  Comments:  Chest pain or chest pressure:    Shortness of breath upon exertion:    Short of breath when lying flat:    Irregular heart rhythm:        Vascular    Pain in calf, thigh, or hip brought on by ambulation: x   Pain in feet at night that wakes you up from your sleep:  x   Blood clot in your veins:    Leg swelling:         Pulmonary    Oxygen at home:    Productive cough:     Wheezing:         Neurologic    Sudden weakness in arms or legs:     Sudden numbness in arms or legs:     Sudden onset of difficulty speaking or slurred  speech:    Temporary loss of vision in one eye:     Problems with dizziness:         Gastrointestinal    Blood in stool:     Vomited blood:         Genitourinary    Burning when urinating:     Blood in urine:        Psychiatric    Major depression:         Hematologic    Bleeding problems:    Problems with blood clotting too easily:        Skin    Rashes or ulcers: x       Constitutional    Fever or chills:      PHYSICAL EXAM  Vitals:   09/18/20 1402  BP: 107/68  Pulse: 81  Resp: 20  Temp: 97.9 F (36.6 C)  SpO2: 100%  Weight: 189 lb (85.7 kg)  Height: 5\' 11"  (1.803 m)    Constitutional: chronically ill appearing. No distress. Appears well nourished.  Neurologic: CN intact. no focal findings. No sensory loss. Psychiatric: Mood and affect symmetric and appropriate. Eyes: No icterus. No conjunctival pallor. Ears, nose, throat: mucous membranes moist. Midline trachea.  Cardiac: regular rate and rhythm.  Respiratory: unlabored. Abdominal: soft, non-tender, non-distended.  Peripheral vascular:  No palpable L femoral pulse  1+ R femoral pulse  No palpable pedal pulses  Gangrene of left great toe, left third toe, heel Extremity: No edema. No cyanosis. No pallor.  Skin: No gangrene. No ulceration.  Lymphatic: No Stemmer's sign. No palpable lymphadenopathy.  PERTINENT LABORATORY AND RADIOLOGIC  DATA  Most recent CBC CBC Latest Ref Rng & Units 08/31/2020  WBC 4.0 - 10.5 K/uL 9.5  Hemoglobin 13.0 - 17.0 g/dL 09/02/2020  Hematocrit 92.3 - 52.0 % 45.3  Platelets 150 - 400 K/uL 221     Most recent CMP CMP Latest Ref Rng & Units 08/31/2020  Glucose 70 - 99 mg/dL 09/02/2020)  BUN 8 - 23 mg/dL 16  Creatinine 762(U - 6.33 mg/dL 3.54  Sodium 5.62 - 563 mmol/L 129(L)  Potassium 3.5 - 5.1 mmol/L 4.3  Chloride 98 - 111 mmol/L 95(L)  CO2 22 - 32 mmol/L 21(L)  Calcium 8.9 - 10.3 mg/dL 9.9   I personally reviewed Dr. 893 angiogram from 08/31/2020 ABI 09/18/2020 Right 0.5.  Toe pressure 44 Left 0.3.  Toe pressure 0  11/18/2020. Rande Brunt, MD [facility] Phone Number: [phone] 09/18/2020 5:04 PM

## 2020-09-19 ENCOUNTER — Encounter (HOSPITAL_COMMUNITY): Payer: Medicare HMO

## 2020-09-19 ENCOUNTER — Inpatient Hospital Stay (HOSPITAL_COMMUNITY)
Admission: RE | Admit: 2020-09-19 | Discharge: 2020-09-28 | DRG: 270 | Disposition: A | Discharge status: Home Health | Payer: Medicare HMO | Attending: Vascular Surgery | Admitting: Vascular Surgery

## 2020-09-19 ENCOUNTER — Encounter (HOSPITAL_COMMUNITY): Payer: Self-pay | Admitting: Vascular Surgery

## 2020-09-19 ENCOUNTER — Encounter (HOSPITAL_COMMUNITY)
Admission: RE | Disposition: A | Discharge status: Home / Self Care | Payer: Self-pay | Source: Home / Self Care | Attending: Vascular Surgery

## 2020-09-19 DIAGNOSIS — I700 Atherosclerosis of aorta: Secondary | ICD-10-CM | POA: Diagnosis present

## 2020-09-19 DIAGNOSIS — E782 Mixed hyperlipidemia: Secondary | ICD-10-CM | POA: Diagnosis present

## 2020-09-19 DIAGNOSIS — E785 Hyperlipidemia, unspecified: Secondary | ICD-10-CM

## 2020-09-19 DIAGNOSIS — Z79899 Other long term (current) drug therapy: Secondary | ICD-10-CM

## 2020-09-19 DIAGNOSIS — I2510 Atherosclerotic heart disease of native coronary artery without angina pectoris: Secondary | ICD-10-CM | POA: Diagnosis not present

## 2020-09-19 DIAGNOSIS — M5136 Other intervertebral disc degeneration, lumbar region: Secondary | ICD-10-CM | POA: Diagnosis not present

## 2020-09-19 DIAGNOSIS — I10 Essential (primary) hypertension: Secondary | ICD-10-CM

## 2020-09-19 DIAGNOSIS — D62 Acute posthemorrhagic anemia: Secondary | ICD-10-CM | POA: Diagnosis not present

## 2020-09-19 DIAGNOSIS — Z452 Encounter for adjustment and management of vascular access device: Secondary | ICD-10-CM | POA: Diagnosis not present

## 2020-09-19 DIAGNOSIS — Z9889 Other specified postprocedural states: Secondary | ICD-10-CM | POA: Diagnosis not present

## 2020-09-19 DIAGNOSIS — Z20822 Contact with and (suspected) exposure to covid-19: Secondary | ICD-10-CM | POA: Diagnosis present

## 2020-09-19 DIAGNOSIS — F17210 Nicotine dependence, cigarettes, uncomplicated: Secondary | ICD-10-CM | POA: Diagnosis present

## 2020-09-19 DIAGNOSIS — I214 Non-ST elevation (NSTEMI) myocardial infarction: Secondary | ICD-10-CM | POA: Diagnosis not present

## 2020-09-19 DIAGNOSIS — J9811 Atelectasis: Secondary | ICD-10-CM | POA: Diagnosis not present

## 2020-09-19 DIAGNOSIS — Z720 Tobacco use: Secondary | ICD-10-CM

## 2020-09-19 DIAGNOSIS — R0602 Shortness of breath: Secondary | ICD-10-CM | POA: Diagnosis not present

## 2020-09-19 DIAGNOSIS — I739 Peripheral vascular disease, unspecified: Secondary | ICD-10-CM | POA: Diagnosis not present

## 2020-09-19 DIAGNOSIS — E119 Type 2 diabetes mellitus without complications: Secondary | ICD-10-CM | POA: Diagnosis not present

## 2020-09-19 DIAGNOSIS — I96 Gangrene, not elsewhere classified: Secondary | ICD-10-CM

## 2020-09-19 DIAGNOSIS — Y92239 Unspecified place in hospital as the place of occurrence of the external cause: Secondary | ICD-10-CM | POA: Diagnosis not present

## 2020-09-19 DIAGNOSIS — I7409 Other arterial embolism and thrombosis of abdominal aorta: Secondary | ICD-10-CM | POA: Diagnosis not present

## 2020-09-19 DIAGNOSIS — E1165 Type 2 diabetes mellitus with hyperglycemia: Secondary | ICD-10-CM | POA: Diagnosis present

## 2020-09-19 DIAGNOSIS — T508X5A Adverse effect of diagnostic agents, initial encounter: Secondary | ICD-10-CM | POA: Diagnosis not present

## 2020-09-19 DIAGNOSIS — Z01810 Encounter for preprocedural cardiovascular examination: Secondary | ICD-10-CM | POA: Diagnosis not present

## 2020-09-19 DIAGNOSIS — E1152 Type 2 diabetes mellitus with diabetic peripheral angiopathy with gangrene: Principal | ICD-10-CM | POA: Diagnosis present

## 2020-09-19 DIAGNOSIS — N179 Acute kidney failure, unspecified: Secondary | ICD-10-CM | POA: Diagnosis not present

## 2020-09-19 DIAGNOSIS — N141 Nephropathy induced by other drugs, medicaments and biological substances: Secondary | ICD-10-CM | POA: Diagnosis not present

## 2020-09-19 DIAGNOSIS — I708 Atherosclerosis of other arteries: Secondary | ICD-10-CM | POA: Diagnosis present

## 2020-09-19 DIAGNOSIS — E1169 Type 2 diabetes mellitus with other specified complication: Secondary | ICD-10-CM | POA: Diagnosis not present

## 2020-09-19 DIAGNOSIS — Z419 Encounter for procedure for purposes other than remedying health state, unspecified: Secondary | ICD-10-CM

## 2020-09-19 DIAGNOSIS — I70262 Atherosclerosis of native arteries of extremities with gangrene, left leg: Secondary | ICD-10-CM | POA: Diagnosis present

## 2020-09-19 DIAGNOSIS — I70201 Unspecified atherosclerosis of native arteries of extremities, right leg: Secondary | ICD-10-CM | POA: Diagnosis not present

## 2020-09-19 DIAGNOSIS — I745 Embolism and thrombosis of iliac artery: Secondary | ICD-10-CM | POA: Diagnosis not present

## 2020-09-19 DIAGNOSIS — I447 Left bundle-branch block, unspecified: Secondary | ICD-10-CM | POA: Diagnosis not present

## 2020-09-19 DIAGNOSIS — Z7984 Long term (current) use of oral hypoglycemic drugs: Secondary | ICD-10-CM | POA: Diagnosis not present

## 2020-09-19 DIAGNOSIS — R9431 Abnormal electrocardiogram [ECG] [EKG]: Secondary | ICD-10-CM | POA: Diagnosis not present

## 2020-09-19 DIAGNOSIS — D649 Anemia, unspecified: Secondary | ICD-10-CM | POA: Diagnosis not present

## 2020-09-19 DIAGNOSIS — E7800 Pure hypercholesterolemia, unspecified: Secondary | ICD-10-CM | POA: Diagnosis not present

## 2020-09-19 DIAGNOSIS — Z01818 Encounter for other preprocedural examination: Secondary | ICD-10-CM | POA: Diagnosis not present

## 2020-09-19 DIAGNOSIS — I70209 Unspecified atherosclerosis of native arteries of extremities, unspecified extremity: Secondary | ICD-10-CM | POA: Diagnosis not present

## 2020-09-19 HISTORY — DX: Tobacco use: Z72.0

## 2020-09-19 HISTORY — PX: ABDOMINAL AORTOGRAM W/LOWER EXTREMITY: CATH118223

## 2020-09-19 LAB — POCT I-STAT, CHEM 8
BUN: 25 mg/dL — ABNORMAL HIGH (ref 8–23)
Calcium, Ion: 1.17 mmol/L (ref 1.15–1.40)
Chloride: 99 mmol/L (ref 98–111)
Creatinine, Ser: 1.2 mg/dL (ref 0.61–1.24)
Glucose, Bld: 122 mg/dL — ABNORMAL HIGH (ref 70–99)
HCT: 45 % (ref 39.0–52.0)
Hemoglobin: 15.3 g/dL (ref 13.0–17.0)
Potassium: 4.3 mmol/L (ref 3.5–5.1)
Sodium: 131 mmol/L — ABNORMAL LOW (ref 135–145)
TCO2: 21 mmol/L — ABNORMAL LOW (ref 22–32)

## 2020-09-19 LAB — GLUCOSE, CAPILLARY
Glucose-Capillary: 109 mg/dL — ABNORMAL HIGH (ref 70–99)
Glucose-Capillary: 164 mg/dL — ABNORMAL HIGH (ref 70–99)

## 2020-09-19 LAB — SARS CORONAVIRUS 2 BY RT PCR (HOSPITAL ORDER, PERFORMED IN ~~LOC~~ HOSPITAL LAB): SARS Coronavirus 2: NEGATIVE

## 2020-09-19 SURGERY — ABDOMINAL AORTOGRAM W/LOWER EXTREMITY
Anesthesia: LOCAL

## 2020-09-19 MED ORDER — SODIUM CHLORIDE 0.9% FLUSH: 3.0000 mL | INTRAVENOUS | Status: DC | PRN

## 2020-09-19 MED ORDER — ONDANSETRON HCL 4 MG/2ML IJ SOLN: 4.0000 mg | Freq: Four times a day (QID) | INTRAMUSCULAR | Status: DC | PRN

## 2020-09-19 MED ORDER — SODIUM CHLORIDE 0.9 % WEIGHT BASED INFUSION
1.0000 mL/kg/h | INTRAVENOUS | Status: DC
  Administered 2020-09-19: 1 mL/kg/h via INTRAVENOUS

## 2020-09-19 MED ORDER — LIDOCAINE HCL (PF) 1 % IJ SOLN
INTRAMUSCULAR | Status: AC
  Filled 2020-09-19: qty 30

## 2020-09-19 MED ORDER — ATORVASTATIN CALCIUM 40 MG PO TABS
40.0000 mg | ORAL_TABLET | Freq: Every day | ORAL | Status: DC
  Administered 2020-09-19 – 2020-09-20 (×2): 40 mg via ORAL
  Filled 2020-09-19 (×2): qty 1

## 2020-09-19 MED ORDER — LABETALOL HCL 5 MG/ML IV SOLN: 10.0000 mg | INTRAVENOUS | Status: DC | PRN

## 2020-09-19 MED ORDER — HEPARIN SODIUM (PORCINE) 5000 UNIT/ML IJ SOLN
5000.0000 [IU] | Freq: Three times a day (TID) | INTRAMUSCULAR | Status: DC
  Administered 2020-09-20 – 2020-09-24 (×12): 5000 [IU] via SUBCUTANEOUS
  Filled 2020-09-19 (×14): qty 1

## 2020-09-19 MED ORDER — ACETAMINOPHEN 325 MG PO TABS: 650.0000 mg | ORAL_TABLET | ORAL | Status: DC | PRN

## 2020-09-19 MED ORDER — INSULIN ASPART 100 UNIT/ML ~~LOC~~ SOLN
0.0000 [IU] | Freq: Three times a day (TID) | SUBCUTANEOUS | Status: DC
  Administered 2020-09-21: 1 [IU] via SUBCUTANEOUS
  Administered 2020-09-22: 2 [IU] via SUBCUTANEOUS
  Administered 2020-09-23: 1 [IU] via SUBCUTANEOUS

## 2020-09-19 MED ORDER — LIDOCAINE HCL (PF) 1 % IJ SOLN
INTRAMUSCULAR | Status: DC | PRN
  Administered 2020-09-19: 15 mL via INTRADERMAL

## 2020-09-19 MED ORDER — OXYCODONE HCL 5 MG PO TABS
5.0000 mg | ORAL_TABLET | ORAL | Status: DC | PRN
  Administered 2020-09-19: 5 mg via ORAL
  Administered 2020-09-20 – 2020-09-21 (×2): 10 mg via ORAL
  Administered 2020-09-22: 5 mg via ORAL
  Administered 2020-09-22 – 2020-09-23 (×5): 10 mg via ORAL
  Filled 2020-09-19 (×3): qty 2
  Filled 2020-09-19: qty 1
  Filled 2020-09-19: qty 2
  Filled 2020-09-19: qty 1
  Filled 2020-09-19 (×3): qty 2

## 2020-09-19 MED ORDER — FENTANYL CITRATE (PF) 100 MCG/2ML IJ SOLN
INTRAMUSCULAR | Status: DC | PRN
  Administered 2020-09-19: 25 ug via INTRAVENOUS
  Administered 2020-09-19: 50 ug via INTRAVENOUS

## 2020-09-19 MED ORDER — ASPIRIN EC 81 MG PO TBEC
81.0000 mg | DELAYED_RELEASE_TABLET | Freq: Every day | ORAL | Status: DC
  Administered 2020-09-19 – 2020-09-23 (×5): 81 mg via ORAL
  Filled 2020-09-19 (×5): qty 1

## 2020-09-19 MED ORDER — ACETAMINOPHEN 325 MG PO TABS
650.0000 mg | ORAL_TABLET | Freq: Four times a day (QID) | ORAL | Status: DC
  Administered 2020-09-19 – 2020-09-23 (×13): 650 mg via ORAL
  Filled 2020-09-19 (×13): qty 2

## 2020-09-19 MED ORDER — SODIUM CHLORIDE 0.9 % IV SOLN: 250.0000 mL | INTRAVENOUS | Status: DC | PRN

## 2020-09-19 MED ORDER — SODIUM CHLORIDE 0.9 % IV SOLN: INTRAVENOUS | Status: DC

## 2020-09-19 MED ORDER — HYDRALAZINE HCL 20 MG/ML IJ SOLN: 5.0000 mg | INTRAMUSCULAR | Status: DC | PRN

## 2020-09-19 MED ORDER — SODIUM CHLORIDE 0.9% FLUSH
3.0000 mL | Freq: Two times a day (BID) | INTRAVENOUS | Status: DC
  Administered 2020-09-20 – 2020-09-23 (×4): 3 mL via INTRAVENOUS

## 2020-09-19 MED ORDER — HEPARIN (PORCINE) IN NACL 1000-0.9 UT/500ML-% IV SOLN
INTRAVENOUS | Status: DC | PRN
  Administered 2020-09-19 (×2): 500 mL

## 2020-09-19 MED ORDER — MIDAZOLAM HCL 2 MG/2ML IJ SOLN
INTRAMUSCULAR | Status: AC
  Filled 2020-09-19: qty 2

## 2020-09-19 MED ORDER — FENTANYL CITRATE (PF) 100 MCG/2ML IJ SOLN
INTRAMUSCULAR | Status: AC
  Filled 2020-09-19: qty 2

## 2020-09-19 MED ORDER — MIDAZOLAM HCL 2 MG/2ML IJ SOLN
INTRAMUSCULAR | Status: DC | PRN
  Administered 2020-09-19: 1 mg via INTRAVENOUS

## 2020-09-19 MED ORDER — HEPARIN (PORCINE) IN NACL 1000-0.9 UT/500ML-% IV SOLN
INTRAVENOUS | Status: AC
  Filled 2020-09-19: qty 1000

## 2020-09-19 MED ORDER — MORPHINE SULFATE (PF) 4 MG/ML IV SOLN: 4.0000 mg | INTRAVENOUS | Status: DC | PRN

## 2020-09-19 MED ORDER — IODIXANOL 320 MG/ML IV SOLN
INTRAVENOUS | Status: DC | PRN
  Administered 2020-09-19: 117 mL via INTRA_ARTERIAL

## 2020-09-19 SURGICAL SUPPLY — 10 items
CATH OMNI FLUSH 5F 65CM (CATHETERS) ×2 IMPLANT
DEVICE CLOSURE MYNXGRIP 5F (Vascular Products) ×2 IMPLANT
KIT MICROPUNCTURE NIT STIFF (SHEATH) ×2 IMPLANT
KIT PV (KITS) ×2 IMPLANT
SHEATH PINNACLE 5F 10CM (SHEATH) ×2 IMPLANT
SHEATH PROBE COVER 6X72 (BAG) ×2 IMPLANT
SYR MEDRAD MARK V 150ML (SYRINGE) ×2 IMPLANT
TRANSDUCER W/STOPCOCK (MISCELLANEOUS) ×2 IMPLANT
TRAY PV CATH (CUSTOM PROCEDURE TRAY) ×2 IMPLANT
WIRE STARTER BENTSON 035X150 (WIRE) ×2 IMPLANT

## 2020-09-19 NOTE — Op Note (Signed)
DATE OF SERVICE: 09/19/2020  PATIENT:  Mark Dickerson  75 y.o. male  PRE-OPERATIVE DIAGNOSIS:  Atherosclerosis of native arteries of bilateral lower extremities causing left lower extremity gangrene  POST-OPERATIVE DIAGNOSIS:  Same  PROCEDURE:   1) US guided right common femoral artery access 2) Aortogram 3) Bilateral lower extremity angiogram ( total contrast) 4) Conscious sedation (32 minutes)  SURGEON:  Surgeon(s) and Role:    * Leonie Douglas, MD - Primary  ASSISTANT: none  ANESTHESIA:   local and IV sedation  EBL: min  BLOOD ADMINISTERED:none  DRAINS: none   LOCAL MEDICATIONS USED:  LIDOCAINE   SPECIMEN:  none  COUNTS: confirmed correct.  TOURNIQUET:  None  PATIENT DISPOSITION:  PACU - hemodynamically stable.   Delay start of Pharmacological VTE agent (>24hrs) due to surgical blood loss or risk of bleeding: no  INDICATION FOR PROCEDURE: Mark Dickerson is a 75 y.o. 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 He underwent an angiogram earlier with Dr. Elby Showers which showed severe bilateral common iliac artery stenosis and left common femoral artery occlusion. He needs in-line flow to his foot to heal his gangrene, and so I offered him angiogram with lower extremity runoff to plan a distal bypass target. We specifically discussed risk of access site complications. The patient understood and wished to proceed.  OPERATIVE FINDINGS:  Severe atherosclerosis from terminal aorta to popliteal arteries bilaterally. Re-demonstration of critical iliac stenosis bilaterally, and left common femoral artery occlusion. 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 Occluded superficial femoral arteries bilaterally Reconstitution of bilateral popliteal arteries above the knee. These are heavily diseased. Below knee popliteal arteries appear relatively free of disease bilaterally. AT is dominant tibial vessel bilaterally. PT / peroneal diminutive on the left. Peroneal appears  occluded on right.   DESCRIPTION OF PROCEDURE: After identification of the patient in the pre-operative holding area, the patient was transferred to the operating room. The patient was positioned supine on the operating room table. Anesthesia was induced. The groins was prepped and draped in standard fashion. A surgical pause was performed confirming correct patient, procedure, and operative location.  The right groin was anesthetized with subcutaneous injection of 1% lidocaine. Using ultrasound guidance, the right common femoral artery was accessed with micropuncture technique. Fluoroscopy was used to confirm cannulation over the femoral head. Sheathogram was not performed. The 32F sheath was upsized to 64F.   An 035 glidewire advantage was advanced into the distal aorta. Over the wire an omni flush catheter was advanced to the level of L2. Aortogram and bilateral lower extremity runoff was performed - see above for details.   A mynx device was used to close the arteriotomy. Hemostasis was excellent upon completion.  Conscious sedation was administered with the use of IV fentanyl and midazolam under continuous physician and nurse monitoring.  Heart rate, blood pressure, and oxygen saturation were continuously monitored.  Total sedation time was 36 minutes  Upon completion of the case instrument and sharps counts were confirmed correct. The patient was transferred to the PACU in good condition. I was present for all portions of the procedure.  PLAN: admit to hospital to accelerate work up for rapidly progressing left foot gangrene. Most durable reconstruction will be aorto-bi-femoral bypass, left common femoral endarterectomy, left femoral to below knee popliteal bypass. Will ask cardiology for preoperative risk stratification for the above. Less durable options could be considered if his cardiac risk is prohibitive to aortic cross clamp. Check vein mapping. Start heparin infusion. IV hydration.  Pain  control.  Rande Brunt. Lenell Antu, MD [facility] Phone Number: [phone] 09/19/2020 3:13 PM

## 2020-09-19 NOTE — Interval H&P Note (Signed)
History and Physical Interval Note:  09/19/2020 1:25 PM  Mark Dickerson  has presented today for surgery, with the diagnosis of pad.  The various methods of treatment have been discussed with the patient and family. After consideration of risks, benefits and other options for treatment, the patient has consented to  Procedure(s): ABDOMINAL AORTOGRAM W/LOWER EXTREMITY (N/A) as a surgical intervention.  The patient's history has been reviewed, patient examined, no change in status, stable for surgery.  I have reviewed the patient's chart and labs.  Questions were answered to the patient's satisfaction.     Leonie Douglas

## 2020-09-19 NOTE — Consult Note (Addendum)
Cardiology Consultation:   Patient ID: Mark Dickerson MRN: [id number]; DOB: 05-28-1946  Admit date: 09/19/2020 Date of Consult: 09/19/2020  PCP:  Marlyn Corporal, PA   [facility]  Cardiologist:  New Advanced Practice Provider:  No care team member to display Electrophysiologist:  None   Patient Profile:   Mark Dickerson is a 75 y.o. male with a hx of DM, HTN, HLD, tobacco abuse since age 19 who is being seen today for the evaluation of preoperative evaluation at the request of Dr. Lenell Antu.  History of Present Illness:   Mr. Holeman has no known cardiac history. He was recently evaluated for left foot gangrene by vascular surgery. OP CTA showed severe CIA stenosis. Stenting was attempted 08/31/20 by IR but was unsuccessful so he was referred to vascular surgery. Underwent angiogram today with findings:  OPERATIVE FINDINGS:  Severe atherosclerosis from terminal aorta to popliteal arteries bilaterally. Re-demonstration of critical iliac stenosis bilaterally, and left common femoral artery occlusion. 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 Occluded superficial femoral arteries bilaterally Reconstitution of bilateral popliteal arteries above the knee. These are heavily diseased. Below knee popliteal arteries appear relatively free of disease bilaterally. AT is dominant tibial vessel bilaterally. PT / peroneal diminutive on the left. Peroneal appears occluded on right.   Dr. Lenell Antu feels the patient would benefit from aorto-bi-femoral bypass, left common femoral endarterectomy, left femoral to below knee popliteal bypass therefore cardiology asked to weigh in on clearance. Tentatively set for 09/24/20 per EMR. Labs otherwise pertinent for Na 129, glucose 120s, Cr 1.21, no lipids on file. He denies any history of CP, SOB, or syncope. He does have activity limiting claudication. Brother had a possible heart issue in the past, but unclear details. Father had lung CA.  Past  Medical History:  Diagnosis Date  . Diabetes mellitus without complication (HCC)   . Hyperlipidemia   . Hypertension   . Tobacco abuse     Past Surgical History:  Procedure Laterality Date  . IR ANGIOGRAM EXTREMITY BILATERAL  08/31/2020  . IR RADIOLOGIST EVAL & MGMT  08/14/2020  . IR US GUIDE VASC ACCESS LEFT  08/31/2020  . IR US GUIDE VASC ACCESS RIGHT  08/31/2020     Home Medications:  Prior to Admission medications   Medication Sig Start Date End Date Taking? Authorizing Provider  acetaminophen (TYLENOL) 500 MG tablet Take 500 mg by mouth every 6 (six) hours as needed for moderate pain.   Yes [provider]  amLODipine (NORVASC) 10 MG tablet Take 10 mg by mouth daily. 04/05/20  Yes [provider]  benazepril (LOTENSIN) 40 MG tablet Take 40 mg by mouth daily. 04/03/20  Yes [provider]  fenofibrate (TRICOR) 145 MG tablet Take 145 mg by mouth daily. 07/11/20  Yes [provider]  hydrochlorothiazide (HYDRODIURIL) 25 MG tablet Take 25 mg by mouth daily. 05/15/20  Yes [provider]  metFORMIN (GLUCOPHAGE-XR) 500 MG 24 hr tablet Take 500 mg by mouth 2 (two) times daily. 05/17/20  Yes [provider]  pravastatin (PRAVACHOL) 20 MG tablet Take 20 mg by mouth daily. 07/09/20  Yes [provider]  SYMBICORT 160-4.5 MCG/ACT inhaler Inhale 2 puffs into the lungs 2 (two) times daily as needed (shortness of breath). 08/01/20  Yes [provider]  Vitamin D, Ergocalciferol, (DRISDOL) 1.25 MG (50000 UNIT) CAPS capsule Take 50,000 Units by mouth once a week. 08/01/20  Yes [provider]  amoxicillin-clavulanate (AUGMENTIN) 875-125 MG tablet Take 1  tablet by mouth 2 (two) times daily. 09/07/20   Asencion IslamStover, Titorya, DPM  Coenzyme Q10 200 MG capsule Take 200 mg by mouth daily.    [provider]  neomycin-polymyxin-hydrocortisone (CORTISPORIN) OTIC solution Apply 1-2 drops in between toes twice a day Patient not taking:  Reported on 09/18/2020 08/03/20   Asencion IslamStover, Titorya, DPM    Inpatient Medications: Scheduled Meds: . acetaminophen  650 mg Oral Q6H  . aspirin EC  81 mg Oral Daily  . atorvastatin  40 mg Oral Daily  . heparin  5,000 Units Subcutaneous Q8H  . insulin aspart  0-9 Units Subcutaneous TID WC  . [START ON 09/20/2020] sodium chloride flush  3 mL Intravenous Q12H   Continuous Infusions: . [START ON 09/20/2020] sodium chloride    . sodium chloride     PRN Meds: [START ON 09/20/2020] sodium chloride, acetaminophen, hydrALAZINE, labetalol, morphine injection, ondansetron (ZOFRAN) IV, oxyCODONE, [START ON 09/20/2020] sodium chloride flush  Allergies:   No Known Allergies  Social History:   Social History   Socioeconomic History  . Marital status: Single    Spouse name: Not on file  . Number of children: Not on file  . Years of education: Not on file  . Highest education level: Not on file  Occupational History  . Not on file  Tobacco Use  . Smoking status: Current Every Day Smoker    Packs/day: 0.50    Types: Cigarettes  . Smokeless tobacco: Never Used  . Tobacco comment: started age 75, currently smoke  Vaping Use  . Vaping Use: Never used  Substance and Sexual Activity  . Alcohol use: Not Currently  . Drug use: Not on file  . Sexual activity: Not on file  Other Topics Concern  . Not on file  Social History Narrative  . Not on file   Social Determinants of Health   Financial Resource Strain: Not on file  Food Insecurity: Not on file  Transportation Needs: Not on file  Physical Activity: Not on file  Stress: Not on file  Social Connections: Not on file  Intimate Partner Violence: Not on file    Family History:   Family History  Problem Relation Age of Onset  . Lung cancer Father      ROS:  Please see the history of present illness.  All other ROS reviewed and negative.     Physical Exam/Data:   Vitals:   09/19/20 1645 09/19/20 1700 09/19/20 1715 09/19/20 1739  BP:  (!) 144/66 (!) 150/68 (!) 142/68 (!) 154/73  Pulse: 77 81 79   Resp: 14 15 16    Temp:    98.2 F (36.8 C)  TempSrc:    Oral  SpO2: 90% 96% 94% 100%  Weight:      Height:       No intake or output data in the 24 hours ending 09/19/20 1823 Last 3 Weights 09/19/2020 09/18/2020 08/31/2020  Weight (lbs) 185 lb 189 lb 200 lb  Weight (kg) 83.915 kg 85.73 kg 90.719 kg     Body mass index is 25.8 kg/m.  General: Well developed, well nourished WM, in no acute distress. Head: Normocephalic, atraumatic, sclera non-icteric, no xanthomas, nares are without discharge. Neck: Negative for carotid bruits. JVP not elevated. Lungs: Clear bilaterally to auscultation without wheezes, rales, or rhonchi. Breathing is unlabored. Heart: RRR S1 S2 without murmurs, rubs, or gallops.  Abdomen: Soft, non-tender, non-distended with normoactive bowel sounds. No rebound/guarding. Extremities: No clubbing or cyanosis. No edema. Gangrenous changes of  the left foot toes. Diminished distal pulses bilaterally Neuro: Alert and oriented X 3. Moves all extremities spontaneously. Psych:  Responds to questions appropriately with a normal affect.  EKG:  The EKG was personally reviewed and demonstrates:  NSR 90bpm, prior anteroseptal infarct, nonspecific STTW changes, nonacute, without prior to compare to  Telemetry:  Telemetry was personally reviewed and demonstrates: NSR  Relevant CV Studies: N/a  Laboratory Data:  High Sensitivity Troponin:  No results for input(s): TROPONINIHS in the last 720 hours.   Chemistry Recent Labs  Lab 09/19/20 1218  NA 131*  K 4.3  CL 99  GLUCOSE 122*  BUN 25*  CREATININE 1.20    No results for input(s): PROT, ALBUMIN, AST, ALT, ALKPHOS, BILITOT in the last 168 hours. Hematology Recent Labs  Lab 09/19/20 1218  HGB 15.3  HCT 45.0   BNPNo results for input(s): BNP, PROBNP in the last 168 hours.  DDimer No results for input(s): DDIMER in the last 168 hours.   Radiology/Studies:   PERIPHERAL VASCULAR CATHETERIZATION  Result Date: 09/19/2020 DATE OF SERVICE: 09/19/2020  PATIENT:  Mark Dickerson  75 y.o. 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  TOURNIQUET:  None  PATIENT DISPOSITION:  PACU - hemodynamically stable.  Delay start of Pharmacological VTE agent (>24hrs) due to surgical blood loss or risk of bleeding: no  INDICATION FOR PROCEDURE: Mark Dickerson is a 75 y.o. 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 He underwent an angiogram earlier with Dr. Elby Showers which showed severe bilateral common iliac artery stenosis and left common femoral artery occlusion. He needs in-line flow to his foot to heal his gangrene, and so I offered him angiogram with lower extremity runoff to plan a distal bypass target. We specifically discussed risk of access site complications. The patient understood and wished to proceed.  OPERATIVE FINDINGS: Severe atherosclerosis from terminal aorta to popliteal arteries bilaterally. Re-demonstration of critical iliac stenosis bilaterally, and left common femoral artery occlusion. 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 Occluded superficial femoral arteries bilaterally Reconstitution of bilateral popliteal arteries above the knee. These are heavily diseased. Below knee popliteal arteries appear relatively free of disease bilaterally. AT is dominant tibial vessel bilaterally. PT / peroneal diminutive  on the left. Peroneal appears occluded on right.  DESCRIPTION OF PROCEDURE: After identification of the patient in the pre-operative holding area, the patient was transferred to the operating room. The patient was positioned supine on the operating room table. Anesthesia was induced. The groins was prepped and draped in standard fashion. A surgical pause was performed confirming correct patient, procedure, and operative location.  The right groin was anesthetized with subcutaneous injection of 1% lidocaine. Using ultrasound guidance, the right common femoral artery was accessed with micropuncture technique. Fluoroscopy was used to confirm cannulation over the femoral head. Sheathogram was not performed. The 81F sheath was upsized to 45F.  An 035 glidewire advantage was advanced into the distal aorta. Over the wire an omni  flush catheter was advanced to the level of L2. Aortogram and bilateral lower extremity runoff was performed - see above for details.  A mynx device was used to close the arteriotomy. Hemostasis was excellent upon completion.  Conscious sedation was administered with the use of IV fentanyl and midazolam under continuous physician and nurse monitoring. Heart rate, blood pressure, and oxygen saturation were continuously monitored. Total sedation time was 36 minutes  Upon completion of the case instrument and sharps counts were confirmed correct. The patient was transferred to the PACU in good condition. I was present for all portions of the procedure.  PLAN: admit to hospital to accelerate work up for rapidly progressing left foot gangrene. Most durable reconstruction will be aorto-bi-femoral bypass, left common femoral endarterectomy, left femoral to below knee popliteal bypass. Will ask cardiology for preoperative risk stratification for the above. Less durable options could be considered if his cardiac risk is prohibitive to aortic cross clamp. Check vein mapping. Start heparin infusion. IV  hydration. Pain control.  Rande Brunt. Lenell Antu, MD [facility] Phone Number: [phone] 09/19/2020 3:13 PM    VAS [place] ABI WITH/WO TBI  Result Date: 09/19/2020 LOWER EXTREMITY DOPPLER STUDY Indications: Ulceration, gangrene, peripheral artery disease, and Claudicant              with left 3rd digit toenail bed eschar after removal, non-healing.              Severe aortoiliac atherosclerotic disease. Unsuccessful attempt at              kissing iliac stent placement on 08/31/20 in IR due to inability to              catheterize left CIA.  Performing Technologist: Thereasa Parkin RVT  Examination Guidelines: A complete evaluation includes at minimum, Doppler waveform signals and systolic blood pressure reading at the level of bilateral brachial, anterior tibial, and posterior tibial arteries, when vessel segments are accessible. Bilateral testing is considered an integral part of a complete examination. Photoelectric Plethysmograph (PPG) waveforms and toe systolic pressure readings are included as required and additional duplex testing as needed. Limited examinations for reoccurring indications may be performed as noted.  ABI Findings: +---------+------------------+-----+----------+--------+ Right    Rt Pressure (mmHg)IndexWaveform  Comment  +---------+------------------+-----+----------+--------+ Brachial 140                                       +---------+------------------+-----+----------+--------+ PTA      72                0.50 monophasic         +---------+------------------+-----+----------+--------+ DP       69                0.48 monophasic         +---------+------------------+-----+----------+--------+ Great Toe44                0.31                    +---------+------------------+-----+----------+--------+ +---------+------------------+-----+----------+-------+ Left     Lt Pressure (mmHg)IndexWaveform  Comment  +---------+------------------+-----+----------+-------+ Brachial 144                                      +---------+------------------+-----+----------+-------+ PTA  0                 0.00 absent            +---------+------------------+-----+----------+-------+ DP       50                0.35 monophasic        +---------+------------------+-----+----------+-------+ Great Toe0                 0.00                   +---------+------------------+-----+----------+-------+ +-------+-----------+-----------+------------+------------+ ABI/TBIToday's ABIToday's TBIPrevious ABIPrevious TBI +-------+-----------+-----------+------------+------------+ Right  0.5        0.31                                +-------+-----------+-----------+------------+------------+ Left   0.35       0                                   +-------+-----------+-----------+------------+------------+  No previous ABI.  Summary: Right: Resting right ankle-brachial index indicates moderate right lower extremity arterial disease. The right toe-brachial index is abnormal. RT great toe pressure = 44 mmHg. Left: Resting left ankle-brachial index indicates severe left lower extremity arterial disease. The left toe-brachial index is abnormal. LT Great toe pressure = 0 mmHg.  *See table(s) above for measurements and observations.  Electronically signed by Waverly Ferrari MD on 09/19/2020 at 9:43:09 AM.    Final      Assessment and Plan:    1. Significant PAD, asked to see patient for pre-operative evaluation - no historical anginal symptoms but claudication has limited his ability to aggressively exercise - EKG abnormal at baseline without prior comparison - will obtain baseline echocardiogram and discuss further with MD, see thoughts below  2. Essential HTN - management per primary team - daughter states he had taken his AM medicine already today  3. Hyperlipidemia  - check lipid profile and LFTs  in AM - primary team has changed pravastatin to atorvastation  4. Tobacco abuse - cessation advised  5. DM  - metformin on hold due to angiogram - would start SSI and check A1C in AM   Risk Assessment/Risk Scores:     No   For questions or updates, please contact [facility] Please consult [email or web address] for contact info under    Signed, Laurann Montana, PA-C  09/19/2020 6:23 PM  Patient seen and examined. Agree with assessment and plan.  Mr. Hilbert Bible is a 75 year old gentleman who is followed by [facility] lives in [place].  He has a longstanding history of tobacco use having started at age 59 up to 1 pack/day but more recently has been smoking 1/2 pack/day.  The last several months, he has noticed progressive claudication symptomatology of his calves.  He has developed left foot toe gangrene.  Patient CTA showed severe CIA stenosis and initial attempt at stenting was unsuccessful by interventional radiology.  He underwent evaluation today by Dr. Corrinne Eagle revealing severe atherosclerosis of his terminal aorta to the popliteal arteries bilaterally.  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  He also had developed a left common iliac dissection.  Official femoral arteries are occluded bilaterally with reconstitution of the popliteal arteries above the knee which are heavily diseased.  In light of his  severe PVD, he is tentatively scheduled to undergo aortobifem bypass surgery, left common femoral endarterectomy and left femoral to below-knee popliteal bypass on September 24, 2020.  Upon questioning he denies any history of chest pain but recently he has noted calf tenderness precluding his ability to walk for long duration.  No significant exertional dyspnea.  He has never had seen a cardiologist previously and is unaware of any prior cardiac evaluations with imaging studies.  Blood pressure earlier today was elevated at 154/73.  Pulse is  regular in the 80s.  T was unremarkable.  I did not appreciate any carotid bruits.  Lungs reveal slightly decreased breath sounds but were without wheezing or rhonchi.  Rhythm was regular with no S3 or S4 gallop.  There was a faint 1/6 systolic murmur.  Abdomen was nontender.  Bandage was present over the right femoral cath site.  I did not palpate an adequate left femoral pulse.   Her extremity pulses were diminished and I was unable to palpate pedal pulses. There was discoloration to his left foot with gangrene of his left great toe, left third toe, and heel. ECG shows normal sinus rhythm at 90 bpm.  QS complexes present V1 through V3.  The patient is unaware of any prior MI.  Renal function creatinine 1.2, sodium low at 131.  He has has a history of hypertension and most recently has been on amlodipine 10 mg, benazepril 40 mg, HCTZ 25 mg.  He is diabetic on Metformin.  He has mixed hyperlipidemia and has been on pravastatin 20 mg in addition to fenofibrate.  With his low sodium, recommend discontinuance of HCTZ.  His significant PVD, recommend aggressive lipid-lowering strategy with high potency statin therapy and will change pravastatin to atorvastatin.  We will plan for 2D echo Doppler study to assess systolic and diastolic function as well as valvular architecture.  He will need an ischemic work-up prior to planned surgery if LV function is stable we will schedule him for coronary CTA for initial assessment however if there is signs of significant LV dysfunction with wall motion abnormality on echo would favor definitive cardiac catheterization preoperatively.  I discussed the importance of complete smoking cessation.  We will follow.  Lennette Bihari, MD, [facility] 09/19/2020 6:29 PM

## 2020-09-20 ENCOUNTER — Inpatient Hospital Stay (HOSPITAL_COMMUNITY): Payer: Medicare HMO

## 2020-09-20 ENCOUNTER — Encounter (HOSPITAL_COMMUNITY): Payer: Self-pay | Admitting: Vascular Surgery

## 2020-09-20 DIAGNOSIS — I739 Peripheral vascular disease, unspecified: Secondary | ICD-10-CM

## 2020-09-20 DIAGNOSIS — E7800 Pure hypercholesterolemia, unspecified: Secondary | ICD-10-CM | POA: Diagnosis not present

## 2020-09-20 DIAGNOSIS — Z01818 Encounter for other preprocedural examination: Secondary | ICD-10-CM | POA: Diagnosis not present

## 2020-09-20 DIAGNOSIS — R9431 Abnormal electrocardiogram [ECG] [EKG]: Secondary | ICD-10-CM | POA: Diagnosis not present

## 2020-09-20 DIAGNOSIS — I70262 Atherosclerosis of native arteries of extremities with gangrene, left leg: Secondary | ICD-10-CM | POA: Diagnosis not present

## 2020-09-20 DIAGNOSIS — Z01810 Encounter for preprocedural cardiovascular examination: Secondary | ICD-10-CM

## 2020-09-20 LAB — BASIC METABOLIC PANEL
Anion gap: 10 (ref 5–15)
BUN: 22 mg/dL (ref 8–23)
CO2: 21 mmol/L — ABNORMAL LOW (ref 22–32)
Calcium: 9.2 mg/dL (ref 8.9–10.3)
Chloride: 100 mmol/L (ref 98–111)
Creatinine, Ser: 1.3 mg/dL — ABNORMAL HIGH (ref 0.61–1.24)
GFR, Estimated: 58 mL/min — ABNORMAL LOW (ref >60)
Glucose, Bld: 102 mg/dL — ABNORMAL HIGH (ref 70–99)
Potassium: 3.9 mmol/L (ref 3.5–5.1)
Sodium: 131 mmol/L — ABNORMAL LOW (ref 135–145)

## 2020-09-20 LAB — GLUCOSE, CAPILLARY
Glucose-Capillary: 122 mg/dL — ABNORMAL HIGH (ref 70–99)
Glucose-Capillary: 111 mg/dL — ABNORMAL HIGH (ref 70–99)
Glucose-Capillary: 112 mg/dL — ABNORMAL HIGH (ref 70–99)
Glucose-Capillary: 133 mg/dL — ABNORMAL HIGH (ref 70–99)

## 2020-09-20 LAB — CBC
HCT: 39.5 % (ref 39.0–52.0)
Hemoglobin: 13.9 g/dL (ref 13.0–17.0)
MCH: 33.5 pg (ref 26.0–34.0)
MCHC: 35.2 g/dL (ref 30.0–36.0)
MCV: 95.2 fL (ref 80.0–100.0)
Platelets: 245 10*3/uL (ref 150–400)
RBC: 4.15 MIL/uL — ABNORMAL LOW (ref 4.22–5.81)
RDW: 13.3 % (ref 11.5–15.5)
WBC: 7.7 10*3/uL (ref 4.0–10.5)
nRBC: 0 % (ref 0.0–0.2)

## 2020-09-20 LAB — ECHOCARDIOGRAM COMPLETE
Area-P 1/2: 2.32 cm2
Height: 71 in
S' Lateral: 3.3 cm
Weight: 2960 oz

## 2020-09-20 LAB — LIPID PANEL
Cholesterol: 150 mg/dL (ref 0–200)
HDL: 43 mg/dL (ref >40)
LDL Cholesterol: 76 mg/dL (ref 0–99)
Total CHOL/HDL Ratio: 3.5 RATIO
Triglycerides: 154 mg/dL — ABNORMAL HIGH (ref <150)
VLDL: 31 mg/dL (ref 0–40)

## 2020-09-20 LAB — HEPATIC FUNCTION PANEL
ALT: 17 U/L (ref 0–44)
AST: 18 U/L (ref 15–41)
Albumin: 3.2 g/dL — ABNORMAL LOW (ref 3.5–5.0)
Alkaline Phosphatase: 43 U/L (ref 38–126)
Bilirubin, Direct: 0.1 mg/dL (ref 0.0–0.2)
Indirect Bilirubin: 0.3 mg/dL (ref 0.3–0.9)
Total Bilirubin: 0.4 mg/dL (ref 0.3–1.2)
Total Protein: 6.3 g/dL — ABNORMAL LOW (ref 6.5–8.1)

## 2020-09-20 LAB — HEMOGLOBIN A1C
Hgb A1c MFr Bld: 7.1 % — ABNORMAL HIGH (ref 4.8–5.6)
Mean Plasma Glucose: 157.07 mg/dL

## 2020-09-20 MED ORDER — LACTATED RINGERS IV SOLN: INTRAVENOUS | Status: DC

## 2020-09-20 MED ORDER — ATORVASTATIN CALCIUM 80 MG PO TABS
80.0000 mg | ORAL_TABLET | Freq: Every day | ORAL | Status: DC
  Administered 2020-09-21 – 2020-09-23 (×3): 80 mg via ORAL
  Filled 2020-09-20 (×3): qty 1

## 2020-09-20 MED ORDER — AMLODIPINE BESYLATE 10 MG PO TABS
10.0000 mg | ORAL_TABLET | Freq: Every day | ORAL | Status: DC
  Administered 2020-09-20 – 2020-09-23 (×4): 10 mg via ORAL
  Filled 2020-09-20 (×4): qty 1

## 2020-09-20 MED ORDER — METOPROLOL TARTRATE 12.5 MG HALF TABLET
12.5000 mg | ORAL_TABLET | Freq: Two times a day (BID) | ORAL | Status: DC
  Administered 2020-09-20 – 2020-09-23 (×8): 12.5 mg via ORAL
  Filled 2020-09-20 (×8): qty 1

## 2020-09-20 NOTE — Progress Notes (Signed)
VASCULAR LAB    Bilateral upper and lower extremity vein mappings have been performed.  See CV proc for preliminary results.   Therisa Mennella, RVT 09/20/2020, 10:59 AM

## 2020-09-20 NOTE — Progress Notes (Signed)
Patient refused hep sq and sliding scale novolog. Patient states that the does not like the needles and "he will take his chances". Explained to the patient the importance of the heparin sq and the reasoning behind the novolog vs his meformin. Patient still did not want to take either. Will continue to monitor

## 2020-09-20 NOTE — Progress Notes (Signed)
  Echocardiogram 2D Echocardiogram has been performed.  Mark Dickerson 09/20/2020, 12:16 PM

## 2020-09-20 NOTE — Plan of Care (Signed)
  Problem: Clinical Measurements: Goal: Respiratory complications will improve Outcome: Progressing Goal: Cardiovascular complication will be avoided Outcome: Progressing   Problem: Health Behavior/Discharge Planning: Goal: Ability to manage health-related needs will improve Outcome: Not Progressing   

## 2020-09-20 NOTE — Progress Notes (Addendum)
Progress Note  Patient Name: Mark Dickerson Date of Encounter: 09/20/2020  Primary Cardiologist:   Subjective   No chest pain or dyspnea.  Inpatient Medications    Scheduled Meds: . acetaminophen  650 mg Oral Q6H  . aspirin EC  81 mg Oral Daily  . atorvastatin  40 mg Oral Daily  . heparin  5,000 Units Subcutaneous Q8H  . insulin aspart  0-9 Units Subcutaneous TID WC  . sodium chloride flush  3 mL Intravenous Q12H   Continuous Infusions: . sodium chloride    . lactated ringers 100 mL/hr at 09/20/20 0910   PRN Meds: sodium chloride, acetaminophen, hydrALAZINE, labetalol, morphine injection, ondansetron (ZOFRAN) IV, oxyCODONE, sodium chloride flush   Vital Signs    Vitals:   09/19/20 2322 09/20/20 0320 09/20/20 0837 09/20/20 1219  BP: 136/71 (!) 160/76 119/67 135/71  Pulse: 79 77 81 75  Resp: Temp: 98.2 F (36.8 C) 98.1 F (36.7 C) 98.3 F (36.8 C) 97.8 F (36.6 C)  TempSrc: Oral  Oral Oral  SpO2: 96% 98% 98% 99%  Weight:      Height:        Intake/Output Summary (Last 24 hours) at 09/20/2020 1259 Last data filed at 09/20/2020 1610 Gross per 24 hour  Intake 1208.02 ml  Output 150 ml  Net 1058.02 ml    I/O since admission:   Filed Weights   09/19/20 1138  Weight: 83.9 kg    Telemetry    Sinus - Personally Reviewed  ECG    ECG (independently read by me): NSR at 90; PRWP V1-4,  Physical Exam    BP 135/71 (BP Location: Left Arm)   Pulse 75   Temp 97.8 F (36.6 C) (Oral)   Resp 18   Ht  (1.803 m)   Wt 83.9 kg   SpO2 99%   BMI 25.80 kg/m  General: Alert, oriented, no distress.  Skin: normal turgor, no rashes, warm and dry HEENT: Normocephalic, atraumatic. Pupils equal round and reactive to light; sclera anicteric; extraocular muscles intact;  Nose without nasal septal hypertrophy Mouth/Parynx benign; Mallinpatti scale Neck: No JVD, no carotid bruits; normal carotid upstroke Lungs: clear to ausculatation and percussion; no  wheezing or rales Chest wall: without tenderness to palpitation Heart: PMI not displaced, RRR, s1 s2 normal, 1/6 systolic murmur, no diastolic murmur, no rubs, gallops, thrills, or heaves Abdomen: soft, nontender; no hepatosplenomehaly, BS+; abdominal aorta nontender and not dilated by palpation. Back: no CVA tenderness Pulses 2+ Musculoskeletal: full range of motion, normal strength, no joint deformities Extremities: no clubbing cyanosis or edema, Homan's sign negative  Neurologic: grossly nonfocal; Cranial nerves grossly wnl Psychologic: Normal mood and affect   Labs    Chemistry Recent Labs  Lab 09/19/20 1218 09/20/20 0124  NA 131* 131*  K 4.3 3.9  CL 99 100  CO2  --  21*  GLUCOSE 122* 102*  BUN 25* 22  CREATININE 1.20 1.30*  CALCIUM  --  9.2  PROT  --  6.3*  ALBUMIN  --  3.2*  AST  --  18  ALT  --  17  ALKPHOS  --  43  BILITOT  --  0.4  GFRNONAA  --  58*  ANIONGAP  --  10     Hematology Recent Labs  Lab 09/19/20 1218 09/20/20 0124  WBC  --  7.7  RBC  --  4.15*  HGB 15.3 13.9  HCT 45.0 39.5  MCV  --  95.2  MCH  --  33.5  MCHC  --  35.2  RDW  --  13.3  PLT  --  245    Cardiac EnzymesNo results for input(s): TROPONINI in the last 168 hours. No results for input(s): TROPIPOC in the last 168 hours.   BNPNo results for input(s): BNP, PROBNP in the last 168 hours.   DDimer No results for input(s): DDIMER in the last 168 hours.   Lipid Panel     Component Value Date/Time   CHOL 150 09/20/2020 0124   TRIG 154 (H) 09/20/2020 0124   HDL 43 09/20/2020 0124   CHOLHDL 3.5 09/20/2020 0124   VLDL 31 09/20/2020 0124   LDLCALC 76 09/20/2020 0124     Radiology    PERIPHERAL VASCULAR CATHETERIZATION  Result Date: 09/19/2020 DATE OF SERVICE: 09/19/2020  PATIENT:  Mark Dickerson  75 y.o. 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  TOURNIQUET:  None  PATIENT DISPOSITION:  PACU - hemodynamically stable.  Delay start of Pharmacological VTE agent (>24hrs) due to surgical blood loss or risk of bleeding: no  INDICATION FOR PROCEDURE: Mark Dickerson is a 75 y.o. 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 He underwent an angiogram earlier with Dr. 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 He needs in-line flow to his foot to heal his gangrene, and so I offered him angiogram with lower extremity runoff to plan a distal bypass target. We specifically discussed risk of access site complications. The patient understood and wished to proceed.  OPERATIVE FINDINGS: Severe atherosclerosis from terminal aorta to popliteal arteries bilaterally. Re-demonstration of critical iliac stenosis bilaterally, and left common femoral artery occlusion. 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 Occluded superficial femoral arteries bilaterally Reconstitution of bilateral popliteal arteries above the knee. These are heavily diseased. Below knee popliteal arteries appear relatively free of disease bilaterally. AT is dominant tibial vessel bilaterally. PT / peroneal diminutive on the left. Peroneal appears occluded on right.  DESCRIPTION OF PROCEDURE: After identification of the patient in the pre-operative holding area, the patient was transferred to the operating room. The patient was positioned supine on the operating room table. Anesthesia was induced. The groins  was prepped and draped in standard fashion. A surgical pause was performed confirming correct patient, procedure, and operative location.  The right groin was anesthetized with subcutaneous injection of 1% lidocaine. Using ultrasound guidance, the right common femoral artery was accessed with micropuncture technique. Fluoroscopy was used to confirm cannulation over the femoral head. Sheathogram was not performed. The 60F sheath was upsized to 33F.  An 035 glidewire advantage was advanced into the distal aorta. Over the wire an omni flush catheter was advanced to the level of L2. Aortogram and bilateral lower extremity runoff was performed - see above for details.  A mynx device was used to close the arteriotomy. Hemostasis was excellent upon completion.  Conscious sedation was administered  with the use of IV fentanyl and midazolam under continuous physician and nurse monitoring. Heart rate, blood pressure, and oxygen saturation were continuously monitored. Total sedation time was 36 minutes  Upon completion of the case instrument and sharps counts were confirmed correct. The patient was transferred to the PACU in good condition. I was present for all portions of the procedure.  PLAN: admit to hospital to accelerate work up for rapidly progressing left foot gangrene. Most durable reconstruction will be aorto-bi-femoral bypass, left common femoral endarterectomy, left femoral to below knee popliteal bypass. Will ask cardiology for preoperative risk stratification for the above. Less durable options could be considered if his cardiac risk is prohibitive to aortic cross clamp. Check vein mapping. Start heparin infusion. IV hydration. Pain control.  Rande Brunt. Lenell Antu, MD [facility] Phone Number: [phone] 09/19/2020 3:13 PM    VAS [place] ABI WITH/WO TBI  Result Date: 09/19/2020 LOWER EXTREMITY DOPPLER STUDY Indications: Ulceration, gangrene, peripheral artery disease, and  Claudicant              with left 3rd digit toenail bed eschar after removal, non-healing.              Severe aortoiliac atherosclerotic disease. Unsuccessful attempt at              kissing iliac stent placement on 08/31/20 in IR due to inability to              catheterize left CIA.  Performing Technologist: Thereasa Parkin RVT  Examination Guidelines: A complete evaluation includes at minimum, Doppler waveform signals and systolic blood pressure reading at the level of bilateral brachial, anterior tibial, and posterior tibial arteries, when vessel segments are accessible. Bilateral testing is considered an integral part of a complete examination. Photoelectric Plethysmograph (PPG) waveforms and toe systolic pressure readings are included as required and additional duplex testing as needed. Limited examinations for reoccurring indications may be performed as noted.  ABI Findings: +---------+------------------+-----+----------+--------+ Right    Rt Pressure (mmHg)IndexWaveform  Comment  +---------+------------------+-----+----------+--------+ Brachial 140                                       +---------+------------------+-----+----------+--------+ PTA      72                0.50 monophasic         +---------+------------------+-----+----------+--------+ DP       69                0.48 monophasic         +---------+------------------+-----+----------+--------+ Great Toe44                0.31                    +---------+------------------+-----+----------+--------+ +---------+------------------+-----+----------+-------+ Left     Lt Pressure (mmHg)IndexWaveform  Comment +---------+------------------+-----+----------+-------+ Brachial 144                                      +---------+------------------+-----+----------+-------+ PTA      0                 0.00 absent            +---------+------------------+-----+----------+-------+ DP       50  0.35  monophasic        +---------+------------------+-----+----------+-------+ Great Toe0                 0.00                   +---------+------------------+-----+----------+-------+ +-------+-----------+-----------+------------+------------+ ABI/TBIToday's ABIToday's TBIPrevious ABIPrevious TBI +-------+-----------+-----------+------------+------------+ Right  0.5        0.31                                +-------+-----------+-----------+------------+------------+ Left   0.35       0                                   +-------+-----------+-----------+------------+------------+  No previous ABI.  Summary: Right: Resting right ankle-brachial index indicates moderate right lower extremity arterial disease. The right toe-brachial index is abnormal. RT great toe pressure = 44 mmHg. Left: Resting left ankle-brachial index indicates severe left lower extremity arterial disease. The left toe-brachial index is abnormal. LT Great toe pressure = 0 mmHg.  *See table(s) above for measurements and observations.  Electronically signed by Waverly Ferrari MD on 09/19/2020 at 9:43:09 AM.    Final    VAS [place] LOWER EXTREMITY SAPHENOUS VEIN MAPPING  Result Date: 09/20/2020 LOWER EXTREMITY VEIN MAPPING Indications:  Pre-op Risk Factors: PAD.  Comparison Study: No prior study Performing Technologist: Sherren Kerns RVS  Examination Guidelines: A complete evaluation includes B-mode imaging, spectral Doppler, color Doppler, and power Doppler as needed of all accessible portions of each vessel. Bilateral testing is considered an integral part of a complete examination. Limited examinations for reoccurring indications may be performed as noted. +---------------+-----------+----------------------+---------------+-----------+   RT Diameter  RT Findings         GSV            LT Diameter  LT Findings      (cm)                                            (cm)                   +---------------+-----------+----------------------+---------------+-----------+      0.50                     Saphenofemoral         0.55                                                   Junction                                  +---------------+-----------+----------------------+---------------+-----------+      0.46                     Proximal thigh         0.46       branching  +---------------+-----------+----------------------+---------------+-----------+      0.50  Mid thigh            0.40                  +---------------+-----------+----------------------+---------------+-----------+      0.49       branching      Distal thigh          0.29       branching  +---------------+-----------+----------------------+---------------+-----------+      0.46       branching          Knee              0.25       branching  +---------------+-----------+----------------------+---------------+-----------+      0.36       branching       Prox calf            0.38                  +---------------+-----------+----------------------+---------------+-----------+      0.38       branching        Mid calf            0.36                  +---------------+-----------+----------------------+---------------+-----------+      0.42                      Distal calf           0.29                  +---------------+-----------+----------------------+---------------+-----------+      0.36                         Ankle              0.34                  +---------------+-----------+----------------------+---------------+-----------+    Preliminary    VAS [place] UPPER EXT VEIN MAPPING (PRE-OP AVF)  Result Date: 09/20/2020 UPPER EXTREMITY VEIN MAPPING  Indications: History of PAD; patient is pre-operative for bypass. History: Atherosclerosis of native artery of left lower extremity with gangrene.  Comparison Study: No prior studies. Performing Technologist:  Sherren Kerns RVS  Examination Guidelines: A complete evaluation includes B-mode imaging, spectral Doppler, color Doppler, and power Doppler as needed of all accessible portions of each vessel. Bilateral testing is considered an integral part of a complete examination. Limited examinations for reoccurring indications may be performed as noted. +-----------------+-------------+----------+---------+ Right Cephalic   Diameter (cm)Depth (cm)Findings  +-----------------+-------------+----------+---------+ Prox upper arm       0.47        0.43             +-----------------+-------------+----------+---------+ Mid upper arm        0.44        0.52             +-----------------+-------------+----------+---------+ Dist upper arm       0.45        0.43             +-----------------+-------------+----------+---------+ Antecubital fossa    0.40        0.39             +-----------------+-------------+----------+---------+ Prox forearm         0.34        0.41   branching +-----------------+-------------+----------+---------+ Mid forearm  0.28        0.38             +-----------------+-------------+----------+---------+ Dist forearm         0.32        0.18             +-----------------+-------------+----------+---------+ Wrist                0.21        0.16             +-----------------+-------------+----------+---------+ +-----------------+-------------+----------+--------------+ Left Cephalic    Diameter (cm)Depth (cm)   Findings    +-----------------+-------------+----------+--------------+ Prox upper arm       0.22        0.39                  +-----------------+-------------+----------+--------------+ Mid upper arm        0.18        0.91                  +-----------------+-------------+----------+--------------+ Dist upper arm       0.22        0.73                  +-----------------+-------------+----------+--------------+ Antecubital  fossa    0.25        0.26     branching    +-----------------+-------------+----------+--------------+ Prox forearm                            not visualized +-----------------+-------------+----------+--------------+ Mid forearm          0.19        0.31                  +-----------------+-------------+----------+--------------+ Dist forearm         0.28        0.40     branching    +-----------------+-------------+----------+--------------+ Wrist                0.35        0.29                  +-----------------+-------------+----------+--------------+ *See table(s) above for measurements and observations.  Diagnosing physician:    Preliminary     Cardiac Studies   ECHO done; formal report pending; but reviewed with Dr. Carmon Ginsberg  Patient Profile     Paublo Warshawsky is a 75 y.o. male with a hx of DM, HTN, HLD, tobacco abuse since age 68 who was seen for preoperative evaluation at the request of Dr. Lenell Antu.  Assessment & Plan    1.  Essential hypertension: Patient has been on amlodipine 10 mg, benazepril 40 mg, HCTZ 25 admission.  There are no signs of edema.  Meds had not been reordered.  Will resume amlodipine 10 mg, and hold benazapril with plan to reduce  to 20 mg with creatinine 1.3, and dcHCTZ since no signs of volume overload.  However with PVD and likelihood for at least mild CAD we will add low-dose beta-blocker therapy with metoprolol initially at 12.5 mg twice a day.  2.  Preoperative assessment: Preliminary review of echo Doppler study shows fairly preserved global LV function, 1 view may have suggested possible minimal apical  hypocontractility but this was not well visualized.  Await formal interpretation.  Patient denies any chest pain or exertional dyspnea.  He denies any exertionally precipitated palpitations.  After discussing with  colleagues regarding CTA imaging, it was felt that perhaps this may not be necessary particularly with his preserved global LV function  and his need for vascular surgery.  I would anticipate he most likely will have coronary calcification based on his PVD which can result in potential blooming artifact and  percent stenosis overestimation.  We will empirically add low-dose beta-blocker therapy rather than HCTZ for blood pressure management which also will have some potential anti-ischemic benefit.  We will tentatively give clearance for PV surgery which is scheduled for Monday.  3.  Hyperlipidemia: We have changed pravastatin to atorvastatin and will titrate to maximum dose at 80 mg.   4.  Tobacco abuse: Cessation is imperative  5. Mild renal insufficiency: Creatinine 1.3 today after recent contrast load    Signed, Lennette Bihari, MD, [facility] 09/20/2020, 12:59 PM

## 2020-09-20 NOTE — Progress Notes (Signed)
[facility] PROGRESS NOTE  ASSESSMENT / PLAN: Mark Dickerson is a 75 y.o. 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  I again re-stated the risk of limb loss if no intervention is pursued. I counseled the patient to take the subcutaneous heparin and insulin treatments. Greatly appreciate the cardiology service's help in optimizing his cardiac risk prior to surgery.  Tentative plans for aorto-bi-femoral bypass, left common femoral endarterectomy, left femoral - popliteal bypass 09/24/20 pending acceptable cardiac risk.   SUBJECTIVE: No complaints. Daughter at bedside.  OBJECTIVE: BP (!) 160/76 (BP Location: Left Arm)   Pulse 77   Temp 98.1 F (36.7 C)   Resp 20   Ht 5\' 11"  (1.803 m)   Wt 83.9 kg   SpO2 98%   BMI 25.80 kg/m   Intake/Output Summary (Last 24 hours) at 09/20/2020 0739 Last data filed at 09/20/2020 0039 Gross per 24 hour  Intake 968.02 ml  Output --  Net 968.02 ml    Constitutional: well appearing. no acute distress. Cardiac: RRR. Pulmonary: unlabored Abdomen: soft Vascular: right groin soft  CBC Latest Ref Rng & Units 09/20/2020 09/19/2020 08/31/2020  WBC 4.0 - 10.5 K/uL 7.7 - 9.5  Hemoglobin 13.0 - 17.0 g/dL 09/02/2020 17.4 08.1  Hematocrit 39.0 - 52.0 % 39.5 45.0 45.3  Platelets 150 - 400 K/uL 245 - 221     CMP Latest Ref Rng & Units 09/20/2020 09/19/2020 08/31/2020  Glucose 70 - 99 mg/dL 09/02/2020) 185(U) 314(H)  BUN 8 - 23 mg/dL 22 702(O) 16  Creatinine 0.61 - 1.24 mg/dL 37(C) 5.88(F 0.27  Sodium 135 - 145 mmol/L 131(L) 131(L) 129(L)  Potassium 3.5 - 5.1 mmol/L 3.9 4.3 4.3  Chloride 98 - 111 mmol/L 100 99 95(L)  CO2 22 - 32 mmol/L 21(L) - 21(L)  Calcium 8.9 - 10.3 mg/dL 9.2 - 9.9  Total Protein 6.5 - 8.1 g/dL 6.3(L) - -  Total Bilirubin 0.3 - 1.2 mg/dL 0.4 - -  Alkaline Phos 38 - 126 U/L 43 - -   AST 15 - 41 U/L 18 - -  ALT 0 - 44 U/L 17 - -    Estimated Creatinine Clearance: 53.1 mL/min (A) (by C-G formula based on SCr of 1.3 mg/dL (H)).  7.41. Rande Brunt, MD [facility] Phone Number: [phone] 09/20/2020 7:39 AM

## 2020-09-21 LAB — GLUCOSE, CAPILLARY
Glucose-Capillary: 125 mg/dL — ABNORMAL HIGH (ref 70–99)
Glucose-Capillary: 102 mg/dL — ABNORMAL HIGH (ref 70–99)
Glucose-Capillary: 94 mg/dL (ref 70–99)
Glucose-Capillary: 122 mg/dL — ABNORMAL HIGH (ref 70–99)

## 2020-09-21 MED ORDER — NICOTINE 21 MG/24HR TD PT24
21.0000 mg | MEDICATED_PATCH | Freq: Every day | TRANSDERMAL | Status: DC
  Administered 2020-09-21 – 2020-09-23 (×3): 21 mg via TRANSDERMAL
  Filled 2020-09-21 (×3): qty 1

## 2020-09-21 NOTE — Progress Notes (Signed)
[facility] OF [place] PROGRESS NOTE  ASSESSMENT / PLAN: Mark Dickerson is a 75 y.o. 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  Plan for aorto-bi-femoral bypass, left common femoral endarterectomy, left femoral - popliteal bypass 09/24/20   SUBJECTIVE: No complaints.   OBJECTIVE: BP 138/72 (BP Location: Left Arm)   Pulse 65   Temp 97.9 F (36.6 C) (Oral)   Resp 16   Ht 5\' 11"  (1.803 m)   Wt 83.9 kg   SpO2 97%   BMI 25.80 kg/m   Intake/Output Summary (Last 24 hours) at 09/21/2020 1147 Last data filed at 09/21/2020 1136 Gross per 24 hour  Intake 2290.86 ml  Output 1150 ml  Net 1140.86 ml    Constitutional: well appearing. no acute distress. Cardiac: RRR. Pulmonary: unlabored Abdomen: soft Vascular: right groin soft  CBC Latest Ref Rng & Units 09/20/2020 09/19/2020 08/31/2020  WBC 4.0 - 10.5 K/uL 7.7 - 9.5  Hemoglobin 13.0 - 17.0 g/dL 09/02/2020 61.4 43.1  Hematocrit 39.0 - 52.0 % 39.5 45.0 45.3  Platelets 150 - 400 K/uL 245 - 221     CMP Latest Ref Rng & Units 09/20/2020 09/19/2020 08/31/2020  Glucose 70 - 99 mg/dL 09/02/2020) 086(P) 619(J)  BUN 8 - 23 mg/dL 22 093(O) 16  Creatinine 0.61 - 1.24 mg/dL 67(T) 2.45(Y 0.99  Sodium 135 - 145 mmol/L 131(L) 131(L) 129(L)  Potassium 3.5 - 5.1 mmol/L 3.9 4.3 4.3  Chloride 98 - 111 mmol/L 100 99 95(L)  CO2 22 - 32 mmol/L 21(L) - 21(L)  Calcium 8.9 - 10.3 mg/dL 9.2 - 9.9  Total Protein 6.5 - 8.1 g/dL 6.3(L) - -  Total Bilirubin 0.3 - 1.2 mg/dL 0.4 - -  Alkaline Phos 38 - 126 U/L 43 - -  AST 15 - 41 U/L 18 - -  ALT 0 - 44 U/L 17 - -    Estimated Creatinine Clearance: 53.1 mL/min (A) (by C-G formula based on SCr of 1.3 mg/dL (H)).  8.33. Rande Brunt, MD [facility] Phone Number: [phone] 09/21/2020 11:47 AM

## 2020-09-22 LAB — GLUCOSE, CAPILLARY
Glucose-Capillary: 112 mg/dL — ABNORMAL HIGH (ref 70–99)
Glucose-Capillary: 153 mg/dL — ABNORMAL HIGH (ref 70–99)
Glucose-Capillary: 90 mg/dL (ref 70–99)
Glucose-Capillary: 100 mg/dL — ABNORMAL HIGH (ref 70–99)

## 2020-09-22 NOTE — Progress Notes (Signed)
    Subjective  -   No complaints   Physical Exam:  CV:RRR Pulm: CTAB    Assessment/Plan:  Again went over details of surgery.  He is going to speak with his daughter tonight and see if he ahs any further questions  _tentatively cleared from cardiology perspective  Plan for surgery MOnday  Continue ASA, Statin, beta blocker  Mark Dickerson 09/22/2020 10:09 AM --  Vitals:   09/22/20 0421 09/22/20 0851  BP: (!) 150/76 135/64  Pulse: 96 77  Resp: 16 18  Temp: 98.7 F (37.1 C) 98.1 F (36.7 C)  SpO2: 98% 98%    Intake/Output Summary (Last 24 hours) at 09/22/2020 1009 Last data filed at 09/22/2020 0600 Gross per 24 hour  Intake 1320 ml  Output 2450 ml  Net -1130 ml     Laboratory CBC    Component Value Date/Time   WBC 7.7 09/20/2020 0124   HGB 13.9 09/20/2020 0124   HCT 39.5 09/20/2020 0124   PLT 245 09/20/2020 0124    BMET    Component Value Date/Time   NA 131 (L) 09/20/2020 0124   K 3.9 09/20/2020 0124   CL 100 09/20/2020 0124   CO2 21 (L) 09/20/2020 0124   GLUCOSE 102 (H) 09/20/2020 0124   BUN 22 09/20/2020 0124   CREATININE 1.30 (H) 09/20/2020 0124   CALCIUM 9.2 09/20/2020 0124   GFRNONAA 58 (L) 09/20/2020 0124    COAG Lab Results  Component Value Date   INR 1.0 08/31/2020   No results found for: PTT  Antibiotics Anti-infectives (From admission, onward)   None       V. Charlena Cross, M.D., [facility] [place] Office: [phone] Pager:  [phone]

## 2020-09-22 NOTE — Progress Notes (Signed)
Patient up to chair for breakfast. Mark Dickerson, [name] RN

## 2020-09-23 DIAGNOSIS — I70262 Atherosclerosis of native arteries of extremities with gangrene, left leg: Secondary | ICD-10-CM

## 2020-09-23 DIAGNOSIS — I70201 Unspecified atherosclerosis of native arteries of extremities, right leg: Secondary | ICD-10-CM

## 2020-09-23 LAB — GLUCOSE, CAPILLARY
Glucose-Capillary: 112 mg/dL — ABNORMAL HIGH (ref 70–99)
Glucose-Capillary: 126 mg/dL — ABNORMAL HIGH (ref 70–99)
Glucose-Capillary: 93 mg/dL (ref 70–99)
Glucose-Capillary: 131 mg/dL — ABNORMAL HIGH (ref 70–99)

## 2020-09-23 LAB — ABO/RH: ABO/RH(D): O POS

## 2020-09-23 MED ORDER — SODIUM CHLORIDE 0.9 % IV SOLN: INTRAVENOUS | Status: DC

## 2020-09-23 MED ORDER — CEFAZOLIN SODIUM-DEXTROSE 1-4 GM/50ML-% IV SOLN
1.0000 g | INTRAVENOUS | Status: DC
  Filled 2020-09-23: qty 50

## 2020-09-23 MED ORDER — SODIUM CHLORIDE 0.9% IV SOLUTION: Freq: Once | INTRAVENOUS | Status: DC

## 2020-09-23 NOTE — Progress Notes (Addendum)
Progress Note  Patient Name: Mark Dickerson Date of Encounter: 09/23/2020  Primary Cardiologist: No primary care provider on file.   Subjective   No chest pain, no shortness of breath.  Anticipating surgery tomorrow.  Inpatient Medications    Scheduled Meds: . acetaminophen  650 mg Oral Q6H  . amLODipine  10 mg Oral Daily  . aspirin EC  81 mg Oral Daily  . atorvastatin  80 mg Oral Daily  . heparin  5,000 Units Subcutaneous Q8H  . insulin aspart  0-9 Units Subcutaneous TID WC  . metoprolol tartrate  12.5 mg Oral BID  . nicotine  21 mg Transdermal Daily  . sodium chloride flush  3 mL Intravenous Q12H   Continuous Infusions: . sodium chloride    . lactated ringers 100 mL/hr at 09/22/20 2222   PRN Meds: sodium chloride, acetaminophen, hydrALAZINE, labetalol, morphine injection, ondansetron (ZOFRAN) IV, oxyCODONE, sodium chloride flush   Vital Signs    Vitals:   09/22/20 2345 09/23/20 0415 09/23/20 0759 09/23/20 0830  BP: 136/74 (!) 141/79 (!) 158/77   Pulse: 62 62 67 64  Resp: 15 20 13    Temp: 98 F (36.7 C) 98.4 F (36.9 C) 98.2 F (36.8 C)   TempSrc: Oral Oral Oral   SpO2: 98% 99% 99%   Weight:      Height:        Intake/Output Summary (Last 24 hours) at 09/23/2020 0919 Last data filed at 09/23/2020 0600 Gross per 24 hour  Intake 2327.71 ml  Output 2350 ml  Net -22.29 ml   Filed Weights   09/19/20 1138  Weight: 83.9 kg    Telemetry    Sinus rhythm- Personally Reviewed  ECG    No new- Personally Reviewed  Physical Exam   GEN: No acute distress.   Neck: No JVD Cardiac: regular rhythm, normal rate, no murmurs, rubs, or gallops.  Respiratory: Clear to auscultation bilaterally. GI: Soft, nontender, non-distended  MS: No edema; Neuro:  Nonfocal  Psych: Normal affect   Labs    Chemistry Recent Labs  Lab 09/19/20 1218 09/20/20 0124  NA 131* 131*  K 4.3 3.9  CL 99 100  CO2  --  21*  GLUCOSE 122* 102*  BUN 25* 22  CREATININE 1.20 1.30*   CALCIUM  --  9.2  PROT  --  6.3*  ALBUMIN  --  3.2*  AST  --  18  ALT  --  17  ALKPHOS  --  43  BILITOT  --  0.4  GFRNONAA  --  58*  ANIONGAP  --  10     Hematology Recent Labs  Lab 09/19/20 1218 09/20/20 0124  WBC  --  7.7  RBC  --  4.15*  HGB 15.3 13.9  HCT 45.0 39.5  MCV  --  95.2  MCH  --  33.5  MCHC  --  35.2  RDW  --  13.3  PLT  --  245    Cardiac EnzymesNo results for input(s): TROPONINI in the last 168 hours. No results for input(s): TROPIPOC in the last 168 hours.   BNPNo results for input(s): BNP, PROBNP in the last 168 hours.   DDimer No results for input(s): DDIMER in the last 168 hours.   Radiology    No results found.  Cardiac Studies   Echocardiogram 09/20/2020 LVEF 55 to 60%, RV size and function normal, normal RVSP, no valvular heart disease.  Patient Profile     Mark Dickerson is a  75 y.o. male with a hx of DM, HTN, HLD, tobacco abuse since age 85 who was seen for preoperative evaluation at the request of Dr. Lenell Antu.  Assessment & Plan   Active Problems:   Atherosclerosis of native artery of left lower extremity with gangrene [facility])   Preoperative cardiovascular risk assessment-patient remains asymptomatic from a cardiac perspective while in hospital.  Echocardiogram unremarkable.  No chest pain or shortness of breath. No known CAD, but several cardiovascular risk factors.  Discussed in detail with patient and family member at bedside. The patient is intermediate risk for high risk procedure.  No further cardiovascular testing is required prior to the procedure. Risk level is not modifiable at this time. If this level of risk is acceptable to the patient and surgical team, the patient should be considered optimized from a cardiovascular standpoint. Plan is for surgery Monday 09/24/20.  HTN - BP mildly elevated, would not change current meds prior to surgery. Will reassess postoperatively.  HLD - continue statin, LDL 76, Trigs mildly elevated 154.        For questions or updates, please contact [facility] Please consult [email or web address] for contact info under        Signed, Parke Poisson, MD  09/23/2020, 9:19 AM

## 2020-09-23 NOTE — Anesthesia Preprocedure Evaluation (Addendum)
Anesthesia Evaluation  Patient identified by MRN, date of birth, ID band Patient awake    Reviewed: Allergy & Precautions, NPO status , Patient's Chart, lab work & pertinent test results  History of Anesthesia Complications Negative for: history of anesthetic complications  Airway Mallampati: II  TM Distance: >3 FB Neck ROM: Full    Dental  (+) Edentulous Upper,    Pulmonary Current Smoker,    Pulmonary exam normal        Cardiovascular hypertension, Pt. on medications + Peripheral Vascular Disease  Normal cardiovascular exam  TTE 09/20/20: EF 55-60%, valves ok   Neuro/Psych negative neurological ROS  negative psych ROS   GI/Hepatic negative GI ROS, Neg liver ROS,   Endo/Other  diabetes, Type 2, Oral Hypoglycemic Agents  Renal/GU negative Renal ROS  negative genitourinary   Musculoskeletal negative musculoskeletal ROS (+)   Abdominal   Peds  Hematology negative hematology ROS (+)   Anesthesia Other Findings Day of surgery medications reviewed with patient.  Reproductive/Obstetrics negative OB ROS                            Anesthesia Physical Anesthesia Plan  ASA: IV  Anesthesia Plan: General   Post-op Pain Management:    Induction: Intravenous  PONV Risk Score and Plan: 2 and Treatment may vary due to age or medical condition, Ondansetron, Dexamethasone and Midazolam  Airway Management Planned: Oral ETT  Additional Equipment: Arterial line  Intra-op Plan:   Post-operative Plan: Extubation in OR  Informed Consent: I have reviewed the patients History and Physical, chart, labs and discussed the procedure including the risks, benefits and alternatives for the proposed anesthesia with the patient or authorized representative who has indicated his/her understanding and acceptance.     Dental advisory given  Plan Discussed with: CRNA  Anesthesia Plan Comments:         Anesthesia Quick Evaluation

## 2020-09-23 NOTE — Progress Notes (Signed)
    Subjective  -   No acute overnight events   Physical Exam:  Stable appearance of left foot       Assessment/Plan:    Extensive conversation with patient and daughter regarding details of tomorrow's procedure.  All questions answered.  NPO after midnight  Mark Dickerson 09/23/2020 10:06 AM --  Vitals:   09/23/20 0759 09/23/20 0830  BP: (!) 158/77   Pulse: 67 64  Resp: 13   Temp: 98.2 F (36.8 C)   SpO2: 99%     Intake/Output Summary (Last 24 hours) at 09/23/2020 1006 Last data filed at 09/23/2020 0600 Gross per 24 hour  Intake 2327.71 ml  Output 2350 ml  Net -22.29 ml     Laboratory CBC    Component Value Date/Time   WBC 7.7 09/20/2020 0124   HGB 13.9 09/20/2020 0124   HCT 39.5 09/20/2020 0124   PLT 245 09/20/2020 0124    BMET    Component Value Date/Time   NA 131 (L) 09/20/2020 0124   K 3.9 09/20/2020 0124   CL 100 09/20/2020 0124   CO2 21 (L) 09/20/2020 0124   GLUCOSE 102 (H) 09/20/2020 0124   BUN 22 09/20/2020 0124   CREATININE 1.30 (H) 09/20/2020 0124   CALCIUM 9.2 09/20/2020 0124   GFRNONAA 58 (L) 09/20/2020 0124    COAG Lab Results  Component Value Date   INR 1.0 08/31/2020   No results found for: PTT  Antibiotics Anti-infectives (From admission, onward)   None       V. Charlena Cross, M.D., [facility] [place] Office: [phone] Pager:  [phone]

## 2020-09-24 ENCOUNTER — Inpatient Hospital Stay (HOSPITAL_COMMUNITY): Payer: Medicare HMO

## 2020-09-24 ENCOUNTER — Inpatient Hospital Stay (HOSPITAL_COMMUNITY): Admission: RE | Admit: 2020-09-24 | Payer: Medicare HMO | Source: Home / Self Care | Admitting: Vascular Surgery

## 2020-09-24 ENCOUNTER — Encounter (HOSPITAL_COMMUNITY): Payer: Self-pay | Admitting: Vascular Surgery

## 2020-09-24 ENCOUNTER — Inpatient Hospital Stay (HOSPITAL_COMMUNITY): Payer: Medicare HMO | Admitting: Anesthesiology

## 2020-09-24 ENCOUNTER — Encounter (HOSPITAL_COMMUNITY)
Admission: RE | Disposition: A | Discharge status: Home / Self Care | Payer: Self-pay | Source: Home / Self Care | Attending: Vascular Surgery

## 2020-09-24 HISTORY — PX: ENDARTERECTOMY FEMORAL: SHX5804

## 2020-09-24 HISTORY — PX: AORTA - BILATERAL FEMORAL ARTERY BYPASS GRAFT: SHX1175

## 2020-09-24 HISTORY — PX: AORTIC ENDARTERECETOMY: SHX5724

## 2020-09-24 HISTORY — PX: VEIN HARVEST: SHX6363

## 2020-09-24 HISTORY — PX: FEMORAL-POPLITEAL BYPASS GRAFT: SHX937

## 2020-09-24 HISTORY — PX: INTRAOPERATIVE ARTERIOGRAM: SHX5157

## 2020-09-24 LAB — POCT ACTIVATED CLOTTING TIME
Activated Clotting Time: 202 seconds
Activated Clotting Time: 279 seconds
Activated Clotting Time: 297 seconds
Activated Clotting Time: 214 seconds
Activated Clotting Time: 267 seconds
Activated Clotting Time: 297 seconds
Activated Clotting Time: 213 seconds

## 2020-09-24 LAB — POCT I-STAT 7, (LYTES, BLD GAS, ICA,H+H)
Acid-base deficit: 4 mmol/L — ABNORMAL HIGH (ref 0.0–2.0)
Acid-base deficit: 4 mmol/L — ABNORMAL HIGH (ref 0.0–2.0)
Bicarbonate: 22.5 mmol/L (ref 20.0–28.0)
Bicarbonate: 21.7 mmol/L (ref 20.0–28.0)
Calcium, Ion: 1.16 mmol/L (ref 1.15–1.40)
Calcium, Ion: 1.14 mmol/L — ABNORMAL LOW (ref 1.15–1.40)
HCT: 36 % — ABNORMAL LOW (ref 39.0–52.0)
HCT: 31 % — ABNORMAL LOW (ref 39.0–52.0)
Hemoglobin: 12.2 g/dL — ABNORMAL LOW (ref 13.0–17.0)
Hemoglobin: 10.5 g/dL — ABNORMAL LOW (ref 13.0–17.0)
O2 Saturation: 98 %
O2 Saturation: 98 %
Patient temperature: 35.7
Patient temperature: 35.5
Potassium: 5 mmol/L (ref 3.5–5.1)
Potassium: 4.9 mmol/L (ref 3.5–5.1)
Sodium: 136 mmol/L (ref 135–145)
Sodium: 136 mmol/L (ref 135–145)
TCO2: 24 mmol/L (ref 22–32)
TCO2: 23 mmol/L (ref 22–32)
pCO2 arterial: 41.9 mmHg (ref 32.0–48.0)
pCO2 arterial: 40.3 mmHg (ref 32.0–48.0)
pH, Arterial: 7.332 — ABNORMAL LOW (ref 7.350–7.450)
pH, Arterial: 7.333 — ABNORMAL LOW (ref 7.350–7.450)
pO2, Arterial: 103 mmHg (ref 83.0–108.0)
pO2, Arterial: 103 mmHg (ref 83.0–108.0)

## 2020-09-24 LAB — CBC
HCT: 37 % — ABNORMAL LOW (ref 39.0–52.0)
HCT: 30.4 % — ABNORMAL LOW (ref 39.0–52.0)
Hemoglobin: 12.9 g/dL — ABNORMAL LOW (ref 13.0–17.0)
Hemoglobin: 10.4 g/dL — ABNORMAL LOW (ref 13.0–17.0)
MCH: 33.7 pg (ref 26.0–34.0)
MCH: 33.7 pg (ref 26.0–34.0)
MCHC: 34.9 g/dL (ref 30.0–36.0)
MCHC: 34.2 g/dL (ref 30.0–36.0)
MCV: 96.6 fL (ref 80.0–100.0)
MCV: 98.4 fL (ref 80.0–100.0)
Platelets: 221 10*3/uL (ref 150–400)
Platelets: 184 10*3/uL (ref 150–400)
RBC: 3.83 MIL/uL — ABNORMAL LOW (ref 4.22–5.81)
RBC: 3.09 MIL/uL — ABNORMAL LOW (ref 4.22–5.81)
RDW: 13.3 % (ref 11.5–15.5)
RDW: 13.5 % (ref 11.5–15.5)
WBC: 7.8 10*3/uL (ref 4.0–10.5)
WBC: 10.8 10*3/uL — ABNORMAL HIGH (ref 4.0–10.5)
nRBC: 0 % (ref 0.0–0.2)
nRBC: 0 % (ref 0.0–0.2)

## 2020-09-24 LAB — POCT I-STAT, CHEM 8
BUN: 8 mg/dL (ref 8–23)
BUN: 9 mg/dL (ref 8–23)
BUN: 9 mg/dL (ref 8–23)
Calcium, Ion: 1.26 mmol/L (ref 1.15–1.40)
Calcium, Ion: 1.24 mmol/L (ref 1.15–1.40)
Calcium, Ion: 1.23 mmol/L (ref 1.15–1.40)
Chloride: 106 mmol/L (ref 98–111)
Chloride: 104 mmol/L (ref 98–111)
Chloride: 104 mmol/L (ref 98–111)
Creatinine, Ser: 0.7 mg/dL (ref 0.61–1.24)
Creatinine, Ser: 0.8 mg/dL (ref 0.61–1.24)
Creatinine, Ser: 0.7 mg/dL (ref 0.61–1.24)
Glucose, Bld: 118 mg/dL — ABNORMAL HIGH (ref 70–99)
Glucose, Bld: 153 mg/dL — ABNORMAL HIGH (ref 70–99)
Glucose, Bld: 165 mg/dL — ABNORMAL HIGH (ref 70–99)
HCT: 41 % (ref 39.0–52.0)
HCT: 37 % — ABNORMAL LOW (ref 39.0–52.0)
HCT: 31 % — ABNORMAL LOW (ref 39.0–52.0)
Hemoglobin: 13.9 g/dL (ref 13.0–17.0)
Hemoglobin: 12.6 g/dL — ABNORMAL LOW (ref 13.0–17.0)
Hemoglobin: 10.5 g/dL — ABNORMAL LOW (ref 13.0–17.0)
Potassium: 3.7 mmol/L (ref 3.5–5.1)
Potassium: 4.5 mmol/L (ref 3.5–5.1)
Potassium: 4.5 mmol/L (ref 3.5–5.1)
Sodium: 137 mmol/L (ref 135–145)
Sodium: 136 mmol/L (ref 135–145)
Sodium: 136 mmol/L (ref 135–145)
TCO2: 20 mmol/L — ABNORMAL LOW (ref 22–32)
TCO2: 21 mmol/L — ABNORMAL LOW (ref 22–32)
TCO2: 21 mmol/L — ABNORMAL LOW (ref 22–32)

## 2020-09-24 LAB — BASIC METABOLIC PANEL
Anion gap: 5 (ref 5–15)
Anion gap: 8 (ref 5–15)
BUN: 11 mg/dL (ref 8–23)
BUN: 11 mg/dL (ref 8–23)
CO2: 24 mmol/L (ref 22–32)
CO2: 19 mmol/L — ABNORMAL LOW (ref 22–32)
Calcium: 9.1 mg/dL (ref 8.9–10.3)
Calcium: 8 mg/dL — ABNORMAL LOW (ref 8.9–10.3)
Chloride: 103 mmol/L (ref 98–111)
Chloride: 108 mmol/L (ref 98–111)
Creatinine, Ser: 1.18 mg/dL (ref 0.61–1.24)
Creatinine, Ser: 1.09 mg/dL (ref 0.61–1.24)
GFR, Estimated: >60 mL/min (ref >60)
GFR, Estimated: >60 mL/min (ref >60)
Glucose, Bld: 112 mg/dL — ABNORMAL HIGH (ref 70–99)
Glucose, Bld: 188 mg/dL — ABNORMAL HIGH (ref 70–99)
Potassium: 4.4 mmol/L (ref 3.5–5.1)
Potassium: 4.7 mmol/L (ref 3.5–5.1)
Sodium: 132 mmol/L — ABNORMAL LOW (ref 135–145)
Sodium: 135 mmol/L (ref 135–145)

## 2020-09-24 LAB — BLOOD GAS, ARTERIAL
Acid-base deficit: 6.4 mmol/L — ABNORMAL HIGH (ref 0.0–2.0)
Bicarbonate: 18.4 mmol/L — ABNORMAL LOW (ref 20.0–28.0)
Drawn by: 51905
FIO2: 28
O2 Saturation: 97.8 %
Patient temperature: 36.1
pCO2 arterial: 34 mmHg (ref 32.0–48.0)
pH, Arterial: 7.348 — ABNORMAL LOW (ref 7.350–7.450)
pO2, Arterial: 104 mmHg (ref 83.0–108.0)

## 2020-09-24 LAB — PREPARE RBC (CROSSMATCH)

## 2020-09-24 LAB — APTT: aPTT: 31 seconds (ref 24–36)

## 2020-09-24 LAB — GLUCOSE, CAPILLARY
Glucose-Capillary: 117 mg/dL — ABNORMAL HIGH (ref 70–99)
Glucose-Capillary: 147 mg/dL — ABNORMAL HIGH (ref 70–99)
Glucose-Capillary: 162 mg/dL — ABNORMAL HIGH (ref 70–99)

## 2020-09-24 LAB — PROTIME-INR
INR: 1.1 (ref 0.8–1.2)
INR: 1.3 — ABNORMAL HIGH (ref 0.8–1.2)
Prothrombin Time: 13.4 seconds (ref 11.4–15.2)
Prothrombin Time: 15.5 seconds — ABNORMAL HIGH (ref 11.4–15.2)

## 2020-09-24 LAB — LACTIC ACID, PLASMA
Lactic Acid, Venous: 2.2 mmol/L (ref 0.5–1.9)
Lactic Acid, Venous: 1.1 mmol/L (ref 0.5–1.9)

## 2020-09-24 LAB — MAGNESIUM: Magnesium: 1.6 mg/dL — ABNORMAL LOW (ref 1.7–2.4)

## 2020-09-24 SURGERY — CREATION, BYPASS, ARTERIAL, AORTA TO FEMORAL, BILATERAL, USING GRAFT
Anesthesia: General

## 2020-09-24 MED ORDER — SODIUM CHLORIDE 0.9 % IV SOLN: Freq: Once | INTRAVENOUS | Status: AC

## 2020-09-24 MED ORDER — ACETAMINOPHEN 10 MG/ML IV SOLN: 1000.0000 mg | Freq: Once | INTRAVENOUS | Status: DC | PRN

## 2020-09-24 MED ORDER — METOPROLOL TARTRATE 5 MG/5ML IV SOLN: 2.0000 mg | INTRAVENOUS | Status: DC | PRN

## 2020-09-24 MED ORDER — ROCURONIUM BROMIDE 10 MG/ML (PF) SYRINGE
PREFILLED_SYRINGE | INTRAVENOUS | Status: AC
  Filled 2020-09-24: qty 10

## 2020-09-24 MED ORDER — OXYMETAZOLINE HCL 0.05 % NA SOLN
NASAL | Status: AC
  Filled 2020-09-24: qty 30

## 2020-09-24 MED ORDER — DEXMEDETOMIDINE (PRECEDEX) IN NS 20 MCG/5ML (4 MCG/ML) IV SYRINGE
PREFILLED_SYRINGE | INTRAVENOUS | Status: DC | PRN
  Administered 2020-09-24: 4 ug via INTRAVENOUS
  Administered 2020-09-24: 8 ug via INTRAVENOUS

## 2020-09-24 MED ORDER — ACETAMINOPHEN 325 MG RE SUPP: 325.0000 mg | RECTAL | Status: DC | PRN

## 2020-09-24 MED ORDER — HEPARIN SODIUM (PORCINE) 1000 UNIT/ML IJ SOLN
INTRAMUSCULAR | Status: AC
  Filled 2020-09-24: qty 1

## 2020-09-24 MED ORDER — PHENYLEPHRINE HCL-NACL 10-0.9 MG/250ML-% IV SOLN
INTRAVENOUS | Status: DC | PRN
  Administered 2020-09-24: 25 ug/min via INTRAVENOUS
  Administered 2020-09-24: 55 ug/min via INTRAVENOUS

## 2020-09-24 MED ORDER — SODIUM CHLORIDE 0.9 % IV SOLN: INTRAVENOUS | Status: DC | PRN

## 2020-09-24 MED ORDER — PANTOPRAZOLE SODIUM 40 MG PO TBEC
40.0000 mg | DELAYED_RELEASE_TABLET | Freq: Every day | ORAL | Status: DC
  Administered 2020-09-25 – 2020-09-28 (×4): 40 mg via ORAL
  Filled 2020-09-24 (×4): qty 1

## 2020-09-24 MED ORDER — CEFAZOLIN SODIUM 1 G IJ SOLR
INTRAMUSCULAR | Status: AC
  Filled 2020-09-24: qty 20

## 2020-09-24 MED ORDER — PROPOFOL 10 MG/ML IV BOLUS
INTRAVENOUS | Status: DC | PRN
  Administered 2020-09-24: 50 mg via INTRAVENOUS
  Administered 2020-09-24: 100 mg via INTRAVENOUS

## 2020-09-24 MED ORDER — PROPOFOL 10 MG/ML IV BOLUS
INTRAVENOUS | Status: AC
  Filled 2020-09-24: qty 20

## 2020-09-24 MED ORDER — OXYCODONE HCL 5 MG/5ML PO SOLN
5.0000 mg | Freq: Once | ORAL | Status: DC | PRN
Start: 2020-09-24 — End: 2020-09-24

## 2020-09-24 MED ORDER — ALBUMIN HUMAN 5 % IV SOLN: INTRAVENOUS | Status: DC | PRN

## 2020-09-24 MED ORDER — HEMOSTATIC AGENTS (NO CHARGE) OPTIME
TOPICAL | Status: DC | PRN
  Administered 2020-09-24 (×2): 1 via TOPICAL

## 2020-09-24 MED ORDER — GUAIFENESIN-DM 100-10 MG/5ML PO SYRP
15.0000 mL | ORAL_SOLUTION | ORAL | Status: DC | PRN
Start: 2020-09-24 — End: 2020-09-28

## 2020-09-24 MED ORDER — EPHEDRINE SULFATE 50 MG/ML IJ SOLN
INTRAMUSCULAR | Status: DC | PRN
  Administered 2020-09-24: 10 mg via INTRAVENOUS
  Administered 2020-09-24: 8 mg via INTRAVENOUS

## 2020-09-24 MED ORDER — PHENYLEPHRINE 40 MCG/ML (10ML) SYRINGE FOR IV PUSH (FOR BLOOD PRESSURE SUPPORT)
PREFILLED_SYRINGE | INTRAVENOUS | Status: AC
  Filled 2020-09-24: qty 10

## 2020-09-24 MED ORDER — LACTATED RINGERS IV SOLN: INTRAVENOUS | Status: DC | PRN

## 2020-09-24 MED ORDER — DEXAMETHASONE SODIUM PHOSPHATE 10 MG/ML IJ SOLN
INTRAMUSCULAR | Status: DC | PRN
  Administered 2020-09-24: 5 mg via INTRAVENOUS

## 2020-09-24 MED ORDER — DOCUSATE SODIUM 100 MG PO CAPS
100.0000 mg | ORAL_CAPSULE | Freq: Every day | ORAL | Status: DC
  Administered 2020-09-25 – 2020-09-27 (×3): 100 mg via ORAL
  Filled 2020-09-24 (×4): qty 1

## 2020-09-24 MED ORDER — ALUM & MAG HYDROXIDE-SIMETH 200-200-20 MG/5ML PO SUSP: 15.0000 mL | ORAL | Status: DC | PRN

## 2020-09-24 MED ORDER — CEFAZOLIN SODIUM-DEXTROSE 2-3 GM-%(50ML) IV SOLR
INTRAVENOUS | Status: DC | PRN
  Administered 2020-09-24 (×2): 2 g via INTRAVENOUS

## 2020-09-24 MED ORDER — PROTAMINE SULFATE 10 MG/ML IV SOLN
INTRAVENOUS | Status: DC | PRN
  Administered 2020-09-24: 50 mg via INTRAVENOUS

## 2020-09-24 MED ORDER — SODIUM CHLORIDE 0.9 % IV SOLN: INTRAVENOUS | Status: DC

## 2020-09-24 MED ORDER — OXYMETAZOLINE HCL 0.05 % NA SOLN
NASAL | Status: DC | PRN
  Administered 2020-09-24 (×2): 2 via NASAL

## 2020-09-24 MED ORDER — MIDAZOLAM HCL 2 MG/2ML IJ SOLN
INTRAMUSCULAR | Status: AC
  Filled 2020-09-24: qty 2

## 2020-09-24 MED ORDER — FENTANYL CITRATE (PF) 100 MCG/2ML IJ SOLN
25.0000 ug | INTRAMUSCULAR | Status: DC | PRN
  Administered 2020-09-24: 50 ug via INTRAVENOUS

## 2020-09-24 MED ORDER — SUGAMMADEX SODIUM 200 MG/2ML IV SOLN
INTRAVENOUS | Status: DC | PRN
  Administered 2020-09-24: 200 mg via INTRAVENOUS

## 2020-09-24 MED ORDER — MORPHINE SULFATE (PF) 2 MG/ML IV SOLN
2.0000 mg | INTRAVENOUS | Status: DC | PRN
  Administered 2020-09-24 (×3): 4 mg via INTRAVENOUS
  Administered 2020-09-25 (×2): 2 mg via INTRAVENOUS
  Administered 2020-09-25: 4 mg via INTRAVENOUS
  Administered 2020-09-25: 2 mg via INTRAVENOUS
  Administered 2020-09-25 (×2): 4 mg via INTRAVENOUS
  Administered 2020-09-25: 2 mg via INTRAVENOUS
  Administered 2020-09-25: 4 mg via INTRAVENOUS
  Administered 2020-09-25 – 2020-09-26 (×2): 2 mg via INTRAVENOUS
  Administered 2020-09-26: 4 mg via INTRAVENOUS
  Filled 2020-09-24 (×3): qty 2
  Filled 2020-09-24: qty 1
  Filled 2020-09-24 (×3): qty 2
  Filled 2020-09-24 (×5): qty 1
  Filled 2020-09-24: qty 3
  Filled 2020-09-24: qty 2

## 2020-09-24 MED ORDER — HEPARIN SODIUM (PORCINE) 1000 UNIT/ML IJ SOLN
INTRAMUSCULAR | Status: DC | PRN
  Administered 2020-09-24: 5000 [IU] via INTRAVENOUS
  Administered 2020-09-24: 8500 [IU] via INTRAVENOUS
  Administered 2020-09-24: 3000 [IU] via INTRAVENOUS

## 2020-09-24 MED ORDER — PHENOL 1.4 % MT LIQD: 1.0000 | OROMUCOSAL | Status: DC | PRN

## 2020-09-24 MED ORDER — ACETAMINOPHEN 325 MG PO TABS: 325.0000 mg | ORAL_TABLET | ORAL | Status: DC | PRN

## 2020-09-24 MED ORDER — ONDANSETRON HCL 4 MG/2ML IJ SOLN
INTRAMUSCULAR | Status: DC | PRN
  Administered 2020-09-24: 4 mg via INTRAVENOUS

## 2020-09-24 MED ORDER — DEXAMETHASONE SODIUM PHOSPHATE 10 MG/ML IJ SOLN
INTRAMUSCULAR | Status: AC
  Filled 2020-09-24: qty 1

## 2020-09-24 MED ORDER — CHLORHEXIDINE GLUCONATE CLOTH 2 % EX PADS
6.0000 | MEDICATED_PAD | Freq: Every day | CUTANEOUS | Status: DC
  Administered 2020-09-24 – 2020-09-28 (×4): 6 via TOPICAL

## 2020-09-24 MED ORDER — PHENYLEPHRINE HCL-NACL 10-0.9 MG/250ML-% IV SOLN
INTRAVENOUS | Status: AC
  Filled 2020-09-24: qty 500

## 2020-09-24 MED ORDER — HYDRALAZINE HCL 20 MG/ML IJ SOLN: 5.0000 mg | INTRAMUSCULAR | Status: DC | PRN

## 2020-09-24 MED ORDER — FENTANYL CITRATE (PF) 100 MCG/2ML IJ SOLN
INTRAMUSCULAR | Status: DC | PRN
  Administered 2020-09-24: 50 ug via INTRAVENOUS
  Administered 2020-09-24: 100 ug via INTRAVENOUS
  Administered 2020-09-24 (×2): 50 ug via INTRAVENOUS

## 2020-09-24 MED ORDER — OXYCODONE HCL 5 MG PO TABS
5.0000 mg | ORAL_TABLET | Freq: Once | ORAL | Status: DC | PRN
Start: 2020-09-24 — End: 2020-09-24

## 2020-09-24 MED ORDER — IODIXANOL 320 MG/ML IV SOLN
INTRAVENOUS | Status: DC | PRN
  Administered 2020-09-24: 50 mL via INTRA_ARTERIAL

## 2020-09-24 MED ORDER — PROTAMINE SULFATE 10 MG/ML IV SOLN
INTRAVENOUS | Status: AC
  Filled 2020-09-24: qty 5

## 2020-09-24 MED ORDER — FENTANYL CITRATE (PF) 250 MCG/5ML IJ SOLN
INTRAMUSCULAR | Status: AC
  Filled 2020-09-24: qty 5

## 2020-09-24 MED ORDER — ONDANSETRON HCL 4 MG/2ML IJ SOLN
INTRAMUSCULAR | Status: AC
  Filled 2020-09-24: qty 2

## 2020-09-24 MED ORDER — PROMETHAZINE HCL 25 MG/ML IJ SOLN: 6.2500 mg | INTRAMUSCULAR | Status: DC | PRN

## 2020-09-24 MED ORDER — SODIUM CHLORIDE (PF) 0.9 % IJ SOLN
INTRAMUSCULAR | Status: AC
  Filled 2020-09-24: qty 20

## 2020-09-24 MED ORDER — ROCURONIUM BROMIDE 10 MG/ML (PF) SYRINGE
PREFILLED_SYRINGE | INTRAVENOUS | Status: DC | PRN
  Administered 2020-09-24 (×2): 30 mg via INTRAVENOUS
  Administered 2020-09-24: 60 mg via INTRAVENOUS
  Administered 2020-09-24 (×2): 30 mg via INTRAVENOUS

## 2020-09-24 MED ORDER — POTASSIUM CHLORIDE CRYS ER 20 MEQ PO TBCR: 20.0000 meq | EXTENDED_RELEASE_TABLET | Freq: Every day | ORAL | Status: DC | PRN

## 2020-09-24 MED ORDER — SODIUM CHLORIDE 0.9 % IV SOLN
500.0000 mL | Freq: Once | INTRAVENOUS | Status: AC | PRN
  Administered 2020-09-25: 500 mL via INTRAVENOUS

## 2020-09-24 MED ORDER — LIDOCAINE 2% (20 MG/ML) 5 ML SYRINGE
INTRAMUSCULAR | Status: AC
  Filled 2020-09-24: qty 5

## 2020-09-24 MED ORDER — CEFAZOLIN SODIUM-DEXTROSE 2-4 GM/100ML-% IV SOLN
2.0000 g | Freq: Three times a day (TID) | INTRAVENOUS | Status: AC
  Administered 2020-09-24 – 2020-09-25 (×2): 2 g via INTRAVENOUS
  Filled 2020-09-24 (×2): qty 100

## 2020-09-24 MED ORDER — LABETALOL HCL 5 MG/ML IV SOLN: 10.0000 mg | INTRAVENOUS | Status: DC | PRN

## 2020-09-24 MED ORDER — 0.9 % SODIUM CHLORIDE (POUR BTL) OPTIME
TOPICAL | Status: DC | PRN
  Administered 2020-09-24 (×4): 1000 mL

## 2020-09-24 MED ORDER — DEXMEDETOMIDINE (PRECEDEX) IN NS 20 MCG/5ML (4 MCG/ML) IV SYRINGE
PREFILLED_SYRINGE | INTRAVENOUS | Status: AC
  Filled 2020-09-24: qty 5

## 2020-09-24 MED ORDER — LIDOCAINE 2% (20 MG/ML) 5 ML SYRINGE
INTRAMUSCULAR | Status: DC | PRN
  Administered 2020-09-24: 100 mg via INTRAVENOUS

## 2020-09-24 MED ORDER — MAGNESIUM SULFATE 2 GM/50ML IV SOLN
2.0000 g | Freq: Every day | INTRAVENOUS | Status: AC | PRN
  Administered 2020-09-24: 2 g via INTRAVENOUS
  Filled 2020-09-24: qty 50

## 2020-09-24 MED ORDER — ONDANSETRON HCL 4 MG/2ML IJ SOLN: 4.0000 mg | Freq: Four times a day (QID) | INTRAMUSCULAR | Status: DC | PRN

## 2020-09-24 SURGICAL SUPPLY — 96 items
BANDAGE ESMARK 6X9 LF (GAUZE/BANDAGES/DRESSINGS) IMPLANT
BENZOIN TINCTURE PRP APPL 2/3 (GAUZE/BANDAGES/DRESSINGS) IMPLANT
BNDG ESMARK 6X9 LF (GAUZE/BANDAGES/DRESSINGS)
CANISTER SUCT 3000ML PPV (MISCELLANEOUS) ×4 IMPLANT
CANNULA VESSEL 3MM 2 BLNT TIP (CANNULA) ×8 IMPLANT
CHLORAPREP W/TINT 26 (MISCELLANEOUS) ×16 IMPLANT
CLIP VESOCCLUDE MED 24/CT (CLIP) ×4 IMPLANT
CLIP VESOCCLUDE SM WIDE 24/CT (CLIP) ×4 IMPLANT
COUNTER NEEDLE 20 DBL MAG RED (NEEDLE) ×4 IMPLANT
COVER PROBE W GEL 5X96 (DRAPES) ×4 IMPLANT
COVER WAND RF STERILE (DRAPES) IMPLANT
CUFF TOURN SGL QUICK 24 (TOURNIQUET CUFF)
CUFF TOURN SGL QUICK 34 (TOURNIQUET CUFF)
CUFF TOURN SGL QUICK 42 (TOURNIQUET CUFF) IMPLANT
CUFF TRNQT CYL 24X4X16.5-23 (TOURNIQUET CUFF) IMPLANT
CUFF TRNQT CYL 34X4.125X (TOURNIQUET CUFF) IMPLANT
DERMABOND ADVANCED (GAUZE/BANDAGES/DRESSINGS) ×5
DERMABOND ADVANCED .7 DNX12 (GAUZE/BANDAGES/DRESSINGS) ×15 IMPLANT
DRAIN CHANNEL 15F RND FF W/TCR (WOUND CARE) IMPLANT
DRAIN PENROSE 1/4X12 LTX STRL (WOUND CARE) IMPLANT
DRAPE C-ARM 42X72 X-RAY (DRAPES) ×4 IMPLANT
DRAPE HALF SHEET 40X57 (DRAPES) IMPLANT
DRAPE X-RAY CASS 24X20 (DRAPES) IMPLANT
ELECT BLADE 4.0 EZ CLEAN MEGAD (MISCELLANEOUS) ×4
ELECT BLADE 6.5 EXT (BLADE) ×4 IMPLANT
ELECT REM PT RETURN 9FT ADLT (ELECTROSURGICAL) ×8
ELECTRODE BLDE 4.0 EZ CLN MEGD (MISCELLANEOUS) ×3 IMPLANT
ELECTRODE REM PT RTRN 9FT ADLT (ELECTROSURGICAL) ×6 IMPLANT
EVACUATOR SILICONE 100CC (DRAIN) IMPLANT
FELT TEFLON 1X6 (MISCELLANEOUS) IMPLANT
FILTER SMOKE EVAC ULPA (FILTER) ×4 IMPLANT
GAUZE SPONGE 4X4 12PLY STRL (GAUZE/BANDAGES/DRESSINGS) IMPLANT
GEL ULTRASOUND 20GR AQUASONIC (MISCELLANEOUS) ×4 IMPLANT
GLOVE SRG 8 PF TXTR STRL LF DI (GLOVE) ×6 IMPLANT
GLOVE SS BIOGEL STRL SZ 6.5 (GLOVE) ×18 IMPLANT
GLOVE SUPERSENSE BIOGEL SZ 6.5 (GLOVE) ×6
GLOVE SURG SS PI 8.0 STRL IVOR (GLOVE) ×4 IMPLANT
GLOVE SURG UNDER POLY LF SZ6.5 (GLOVE) ×4 IMPLANT
GLOVE SURG UNDER POLY LF SZ7 (GLOVE) ×28 IMPLANT
GLOVE SURG UNDER POLY LF SZ8 (GLOVE) ×2
GOWN STRL REUS W/ TWL LRG LVL3 (GOWN DISPOSABLE) ×6 IMPLANT
GOWN STRL REUS W/ TWL XL LVL3 (GOWN DISPOSABLE) ×9 IMPLANT
GOWN STRL REUS W/TWL LRG LVL3 (GOWN DISPOSABLE) ×2
GOWN STRL REUS W/TWL XL LVL3 (GOWN DISPOSABLE) ×3
GRAFT HEMASHIELD 18X9MM (Vascular Products) ×4 IMPLANT
INSERT FOGARTY 61MM (MISCELLANEOUS) ×8 IMPLANT
INSERT FOGARTY SM (MISCELLANEOUS) ×16 IMPLANT
KIT BASIN OR (CUSTOM PROCEDURE TRAY) ×4 IMPLANT
KIT MICROPUNCTURE NIT STIFF (SHEATH) ×8 IMPLANT
KIT TURNOVER KIT B (KITS) ×4 IMPLANT
LOOP VESSEL MAXI BLUE (MISCELLANEOUS) ×4 IMPLANT
LOOP VESSEL MINI RED (MISCELLANEOUS) ×12 IMPLANT
MARKER GRAFT CORONARY BYPASS (MISCELLANEOUS) IMPLANT
MARKER PEN SURG W/LABELS BLK (STERILIZATION PRODUCTS) ×4 IMPLANT
NS IRRIG 1000ML POUR BTL (IV SOLUTION) ×12 IMPLANT
PACK AORTA (CUSTOM PROCEDURE TRAY) ×4 IMPLANT
PACK PERIPHERAL VASCULAR (CUSTOM PROCEDURE TRAY) IMPLANT
PAD ARMBOARD 7.5X6 YLW CONV (MISCELLANEOUS) ×8 IMPLANT
PENCIL BUTTON HOLSTER BLD 10FT (ELECTRODE) ×4 IMPLANT
PENCIL SMOKE EVACUATOR (MISCELLANEOUS) ×4 IMPLANT
SET COLLECT BLD 21X3/4 12 (NEEDLE) IMPLANT
STOPCOCK 4 WAY LG BORE MALE ST (IV SETS) ×4 IMPLANT
STRIP CLOSURE SKIN 1/2X4 (GAUZE/BANDAGES/DRESSINGS) ×12 IMPLANT
SUT ETHILON 3 0 PS 1 (SUTURE) IMPLANT
SUT MNCRL AB 4-0 PS2 18 (SUTURE) ×24 IMPLANT
SUT PDS AB 1 TP1 96 (SUTURE) ×8 IMPLANT
SUT PROLENE 3 0 SH1 36 (SUTURE) ×20 IMPLANT
SUT PROLENE 4 0 RB 1 (SUTURE)
SUT PROLENE 4-0 RB1 .5 CRCL 36 (SUTURE) IMPLANT
SUT PROLENE 5 0 C 1 24 (SUTURE) ×20 IMPLANT
SUT PROLENE 5 0 C 1 36 (SUTURE) ×12 IMPLANT
SUT PROLENE 6 0 BV (SUTURE) ×16 IMPLANT
SUT SILK 2 0 (SUTURE) ×2
SUT SILK 2 0 SH (SUTURE) IMPLANT
SUT SILK 2 0 TIES 17X18 (SUTURE)
SUT SILK 2 0SH CR/8 30 (SUTURE) ×4 IMPLANT
SUT SILK 2-0 18XBRD TIE 12 (SUTURE) ×6 IMPLANT
SUT SILK 2-0 18XBRD TIE BLK (SUTURE) IMPLANT
SUT SILK 3 0 (SUTURE) ×3
SUT SILK 3 0 TIES 17X18 (SUTURE)
SUT SILK 3-0 18XBRD TIE 12 (SUTURE) ×9 IMPLANT
SUT SILK 3-0 18XBRD TIE BLK (SUTURE) IMPLANT
SUT SILK 4 0 (SUTURE) ×1
SUT SILK 4-0 18XBRD TIE 12 (SUTURE) ×3 IMPLANT
SUT VIC AB 0 CT1 18XCR BRD 8 (SUTURE) ×6 IMPLANT
SUT VIC AB 0 CT1 8-18 (SUTURE) ×2
SUT VIC AB 2-0 CT1 27 (SUTURE) ×5
SUT VIC AB 2-0 CT1 TAPERPNT 27 (SUTURE) ×15 IMPLANT
SUT VIC AB 3-0 SH 27 (SUTURE) ×7
SUT VIC AB 3-0 SH 27X BRD (SUTURE) ×21 IMPLANT
SYR BULB IRRIG 60ML STRL (SYRINGE) ×4 IMPLANT
TOWEL GREEN STERILE (TOWEL DISPOSABLE) ×4 IMPLANT
TOWEL SURG RFD BLUE STRL DISP (DISPOSABLE) ×4 IMPLANT
TRAY FOLEY MTR SLVR 16FR STAT (SET/KITS/TRAYS/PACK) ×4 IMPLANT
UNDERPAD 30X36 HEAVY ABSORB (UNDERPADS AND DIAPERS) ×4 IMPLANT
WATER STERILE IRR 1000ML POUR (IV SOLUTION) ×4 IMPLANT

## 2020-09-24 NOTE — Progress Notes (Signed)
[facility] [place] PROGRESS NOTE  ASSESSMENT / PLAN: Mark Dickerson is a 75 y.o. 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  For aortobifemoral bypass, left femoral popliteal bypass today   SUBJECTIVE: No complaints. All questions answered.  OBJECTIVE: BP (!) 176/82 (BP Location: Left Arm)   Pulse 68   Temp 97.8 F (36.6 C) (Oral)   Resp 11   Ht 5\' 11"  (1.803 m)   Wt 83.9 kg   SpO2 98%   BMI 25.80 kg/m   Intake/Output Summary (Last 24 hours) at 09/24/2020 1539 Last data filed at 09/24/2020 1450 Gross per 24 hour  Intake 5636.13 ml  Output 3150 ml  Net 2486.13 ml    Constitutional: well appearing. no acute distress. Cardiac: RRR. Pulmonary: unlabored Abdomen: soft Vascular: unchanged appearance of left foot  CBC Latest Ref Rng & Units 09/24/2020 09/20/2020 09/19/2020  WBC 4.0 - 10.5 K/uL 7.8 7.7 -  Hemoglobin 13.0 - 17.0 g/dL 12.9(L) 13.9 15.3  Hematocrit 39.0 - 52.0 % 37.0(L) 39.5 45.0  Platelets 150 - 400 K/uL 221 245 -     CMP Latest Ref Rng & Units 09/24/2020 09/20/2020 09/19/2020  Glucose 70 - 99 mg/dL 11/19/2020) 449(E) 010(O)  BUN 8 - 23 mg/dL 11 22 712(R)  Creatinine 0.61 - 1.24 mg/dL 97(J 8.83) 2.54(D  Sodium 135 - 145 mmol/L 132(L) 131(L) 131(L)  Potassium 3.5 - 5.1 mmol/L 4.4 3.9 4.3  Chloride 98 - 111 mmol/L 103 100 99  CO2 22 - 32 mmol/L 24 21(L) -  Calcium 8.9 - 10.3 mg/dL 9.1 9.2 -  Total Protein 6.5 - 8.1 g/dL - 6.3(L) -  Total Bilirubin 0.3 - 1.2 mg/dL - 0.4 -  Alkaline Phos 38 - 126 U/L - 43 -  AST 15 - 41 U/L - 18 -  ALT 0 - 44 U/L - 17 -    Estimated Creatinine Clearance: 58.5 mL/min (by C-G formula based on SCr of 1.18 mg/dL).  8.26. Rande Brunt, MD [facility] Phone Number: [phone] 09/24/2020 3:39 PM

## 2020-09-24 NOTE — Anesthesia Procedure Notes (Signed)
Arterial Line Insertion Start/End3/14/2022 6:55 AM, 09/24/2020 7:00 AM Performed by: Kaylyn Layer, MD, Demetrio Lapping, CRNA  Patient location: Pre-op. Preanesthetic checklist: patient identified, IV checked, site marked, risks and benefits discussed, surgical consent, monitors and equipment checked, pre-op evaluation, timeout performed and anesthesia consent Lidocaine 1% used for infiltration Left, radial was placed Catheter size: 20 G Hand hygiene performed   Attempts: 1 Procedure performed without using ultrasound guided technique. Following insertion, Biopatch and dressing applied. Post procedure assessment: normal  Patient tolerated the procedure well with no immediate complications. Additional procedure comments: Performed by Dennard Schaumann.

## 2020-09-24 NOTE — Plan of Care (Signed)

## 2020-09-24 NOTE — Brief Op Note (Signed)
09/19/2020 - 09/24/2020  3:40 PM  PATIENT:  Mark Dickerson  75 y.o. 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  PROCEDURE:   1) Aortic endarterectomy 2) Aorto-bi-femoral bypass (16 x 65mm Dacron) 3) Left common femoral extended endarterectomy and profundaplasty 4) Right common femoral extended endarterectomy and profundaplasty 5) Left greater saphenous vein harvest 6) Left common femoral to below knee popliteal artery bypass with non-reversed greater saphenous vein in anatomic tunnel 7) Bilateral lower extremity angiography  CO-SURGEONS:    * Leonie Douglas, MD    * Cephus Shelling, MD  PHYSICIAN ASSISTANT: Wendi Maya, PA-C  ANESTHESIA:   general  EBL:  1300 mL   Urine output:  IV fluids: crystalloid; albumin  BLOOD ADMINISTERED:335 CC CELLSAVER  DRAINS: none   LOCAL MEDICATIONS USED:  NONE  SPECIMEN:  No Specimen  DISPOSITION OF SPECIMEN:  N/A  COUNTS:  Correct x 2  TOURNIQUET:  none  DICTATION: .Dragon Dictation  PLAN OF CARE: ICU  PATIENT DISPOSITION:  ICU - extubated and stable.   Delay start of Pharmacological VTE agent (>24hrs) due to surgical blood loss or risk of bleeding: no

## 2020-09-24 NOTE — Op Note (Addendum)
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  Harvest of left leg great saphenous vein 2.  Aortic endarterectomy 3.  Left common femoral endarterectomy with profundoplasty 4. Aortobifemoral bypass (dictated by Dr. Lenell Antu) 5. Left common femoral to below knee popliteal artery bypass with ipsilateral great saphenous vein (dictated by Dr. Lenell Antu)  Surgeon: Dr. Cephus Shelling, MD  Co-surgeon: Dr. Heath Lark, MD  Assistant: Wendi Maya, PA   MODIFIER: A 22 modifier is appropriate in this case given the necessity for two surgeons, extended time in case planning, multiple operative sites and procedures, all approximately 200% more difficult than typical.   Indication: Patient is a 75 year old male who has had progressive tissue loss in the left lower extremity and was seen last week by Dr. Lenell Antu.  Ultimately he has evidence of a left common femoral occlusion as well as a critical stenosis of the left common iliac artery.  He underwent aortogram to evaluate for distal target with planned aortobifemoral bypass with a left common femoral to below-knee popliteal bypass.  He presents today after risks benefits discussed.  Co-surgeon was needed due to the complexity of the case and also for multiple anastomosis and to expedite the procedure.  Findings: An 18 mm by 9 mm bifurcated Dacron graft was sewn end-to-end to the proximal infrarenal aorta.  This did require endarterectomy of the aorta in order to sew the anastomosis with a felt strip.  Bilateral common femoral endarterectomies were required with profundoplasty and each limb was tunneled to the bilateral common femoral arteries for end-to-side anastomosis.  The common femoral to below-knee popliteal artery  bypass was sewn subfascial subsartorial in the left leg using nonreversed ipsilateral great saphenous vein.  Brisk signals in left foot after bypass.  Arteriogram performed since only profunda runoff on right and no signals in right foot initially in OR.  Anesthesia: General  Details: I was available for the duration of the procedure and ultimately exposed the left common femoral artery as well as assisted with exposure of the infrarenal abdominal aorta.  Please see Dr. Verita Trampe dictation for these portions of the procedure.  I did mark the great saphenous vein in the left leg from the saphenofemoral junction down to the mid calf.  This was of excellent caliber in the proximal to mid thigh but then bifurcated in the distal thigh and we ultimately selected the larger of the two branches.  Three skip incisions were made to circumferentially harvest the left great saphenous vein and all side branches were ligated between 3-0 silk ties and divided and then we were able to mobilize the saphenous vein between the skin tunnels from the saphenofemoral junction to the mid calf.  Ultimately once all the targets were exposed we proceeded by clamping the infrarenal aorta with a harken C-clamp just below the left renal artery and the distal aorta was controlled with a DeBakey aortic clamp.  Once the aorta was transected the distal aortic stump was oversewn with a 3-0 Prolene.  Once we evaluated the proximal infrarenal aorta it became apparent that this was going to require endarterectomy given a very ulcerated segment on the posterior wall that was heavily calcified circumferentially.  Ultimately a Runner, broadcasting/film/video was brought on the field and we developed a plane carefully endarterectomizing the diseased infra-renal aorta in  order to create a sewing plane on the infra-renal aorta.  An 18 mm x 9 mm bifurcated Dacron graft was then brought on the field and I assisted Dr. Lenell Antu sewing the proximal anastomosis with 3-0  Prolene in parachute technique and felt strip.  Each limb was then tunneled to bilateral groins through the previously created tunnels under the ureters using our umbilical tapes.  I proceeded by controlling the left SFA with Vesseloops and the left profunda with baby profunda clamp and the distal external iliac was controlled with a Henley clamp.  Common femoral was then opened with limb blade scalpel extended with Potts scissors.  Left common femoral artery was completely occluded.  I then used the [facility] to create a nice plane in the common femoral was endarterectomized up onto the distal external iliac artery and I carried the endarterectomy distal and the profunda was endarterectomized with eversion technique.  I had excellent backbleeding from the profunda.  I then straightened the left limb of the Dacron graft and this was cut to the appropriate length and beveled.  I controlled this limb proximally in the abdomen with a Fogarty clamp and had excellent inflow.  I then sewed end-to-side anastomosis to the left common femoral artery with 5-0 Prolene parachute technique.  I did de-air everything prior to completion.  We had triphasic flow in the left profunda which was his known runoff with SFA occlusion.  Ultimately while Dr. Lenell Antu was sewing the anastomosis in the right groin I harvested the vein.  I transected the vein at the saphenofemoral junction and over-sewed this with 5-0 Prolene.  I passed this through skin tunnels and it was transected distally and oversewn with 2-0 silk.  I reversed the vein and placed a vessel cannula.  It dilated nicely with heparized saline and I did repair several areas with 6-0 Prolene.  One branch was ligated with 4-0 silk.  We then brought the saphenous vein on the field in nonreversed fashion.  This was spatulated and end-to-side anastomosis was sewn to the left limb of the graft after it was controlled and arteriotomy was made.  Please see Dr. Verita Reigel dictation  for this portion of the case.  All the valves in the vein were carefully lysed after the proximal anastomosis was sewn with [place] valvulotome given size mismatch and we elected to do this in nonreversed fashion.  We then marked the vein for orientation and it was passed through the previously created tunnel to the below-knee popliteal artery.  Please see Dr. Butch Penny dictation for remainder of case.  Complication: None  Condition: Stable, ICU overnight  Cephus Shelling, MD [facility] [place] Office: [phone]   Cephus Shelling

## 2020-09-24 NOTE — Anesthesia Procedure Notes (Signed)
Procedure Name: Intubation Date/Time: 09/24/2020 7:42 AM Performed by: Demetrio Lapping, CRNA Pre-anesthesia Checklist: Patient identified, Emergency Drugs available, Suction available and Patient being monitored Patient Re-evaluated:Patient Re-evaluated prior to induction Oxygen Delivery Method: Circle system utilized Preoxygenation: Pre-oxygenation with 100% oxygen Induction Type: IV induction Ventilation: Mask ventilation without difficulty Laryngoscope Size: Miller and 1 Grade View: Grade I Tube type: Oral Tube size: 7.5 mm Number of attempts: 1 Airway Equipment and Method: Stylet and Oral airway Placement Confirmation: ETT inserted through vocal cords under direct vision,  positive ETCO2 and breath sounds checked- equal and bilateral Tube secured with: Tape Dental Injury: Teeth and Oropharynx as per pre-operative assessment  Comments: Performed by Dennard Schaumann

## 2020-09-24 NOTE — Anesthesia Postprocedure Evaluation (Signed)
Anesthesia Post Note  Patient: Mark Dickerson  Procedure(s) Performed: AORTA BIFEMORAL BYPASS GRAFT (N/A ) LEFT FEMORAL ARTERY TO BELOW KNEE POPLITEAL ARTERY BY PASS GRAFT USING NON REVERSED SAPPENOUS VEIN. (Left ) INTRA OPERATIVE ARTERIOGRAM RIGHT AND LEFT LEG (Bilateral ) ENDARTERECTOMY BILATERAL COMMON FEMORAL ARTERIES (Bilateral ) AORTIC ENDARTERECETOMY (N/A ) VEIN HARVEST LEFT GREATER SAPPHENOUS (Left ) BILATERAL PROFUNDOPLASTY, COMMON FEMORALS (Bilateral )     Patient location during evaluation: PACU Anesthesia Type: General Level of consciousness: awake and alert and oriented Pain management: pain level controlled Vital Signs Assessment: post-procedure vital signs reviewed and stable Respiratory status: spontaneous breathing, nonlabored ventilation and respiratory function stable Cardiovascular status: blood pressure returned to baseline Postop Assessment: no apparent nausea or vomiting Anesthetic complications: no   No complications documented.  Last Vitals:  Vitals:   09/24/20 1700 09/24/20 1800  BP: 104/70 119/70  Pulse: 75 80  Resp: (!) 21 13  Temp: 36.6 C   SpO2: 100% 100%    Last Pain:  Vitals:   09/24/20 1630  TempSrc:   PainSc: 10-Worst pain ever                 Kaylyn Layer

## 2020-09-24 NOTE — Op Note (Signed)
DATE OF SERVICE: 09/24/2020  PATIENT:  Mark Dickerson  75 y.o. 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 Chestine Sporelark) 2) Aorto-bi-femoral bypass (16 x 8mm Dacron) 3) Left common femoral extended endarterectomy and profundaplasty (dictated by Dr. Chestine Sporelark) 4) Right common femoral extended endarterectomy and profundaplasty 5) Left greater saphenous vein harvest (dictated by Dr. Chestine Sporelark) 6) Left common femoral to below knee popliteal artery bypass with non-reversed greater saphenous vein in anatomic tunnel 7) Bilateral lower extremity angiography  COSURGEONS:     * Leonie DouglasHawken, Nolberto N, MD - Primary    * Cephus Shellinglark, Christopher J, MD - Assisting  ASSISTANT: Wendi MayaSandra Setzer, PA-C  An assistant was required to facilitate exposure and expedite the case.  MODIFIER: A 22 modifier is appropriate in this case given the necessity for two surgeons, extended time in case planning, multiple operative sites and procedures, all approximately 200% more difficult than typical.   ANESTHESIA:   general  EBL: 1300mL  URINE OUTPUT: 750mL  INTRAVENOUS FLUIDS: 3600mL crystalloid, 1000mL albumin  BLOOD ADMINISTERED:335 CC CELLSAVER  DRAINS: none   LOCAL MEDICATIONS USED:  NONE  SPECIMEN:  none  COUNTS: confirmed correct.  TOURNIQUET:  None  PATIENT DISPOSITION:  ICU - extubated and stable.   Delay start of Pharmacological VTE agent (>24hrs) due to surgical blood loss or risk of bleeding: no  INDICATION FOR PROCEDURE: Mark Dickerson is a 75 y.o. male with atherosclerosis of native arteries of bilateral lower extremities causing left forefoot and heel gangrene. After careful discussion of risks, benefits, and alternatives the patient was offered aortobifemoral bypass, bilateral common femoral endarterectomy, left common femoral to below-knee  popliteal artery bypass. The patient understood and wished to proceed.  OPERATIVE FINDINGS:  Heavily calcified aorta required endarterectomy for proximal aortobifemoral anastomosis. Extensive disease in bilateral femoral bifurcations required extended endarterectomy and profundoplasty.  No Doppler flow was noted in the right tibial vessels at end of case.  Bilateral lower extremity angiography was performed.  This confirmed no technical problems and either of the femoral anastomoses or the left below-knee popliteal anastomosis.  Angiographic flow was confirmed in the right peroneal artery.  DESCRIPTION OF PROCEDURE: After identification of the patient in the pre-operative holding area, the patient was transferred to the operating room. The patient was positioned supine on the operating room table. Anesthesia was induced. The abdomen, groins, bilateral lower extremities was prepped and draped in standard fashion. A surgical pause was performed confirming correct patient, procedure, and operative location.  Oblique incisions were made over the bilateral femoral bifurcations.  Dr. Chestine Sporelark will dictate the left femoral endarterectomy.  On the right I carried my incision down to the subcutaneous tissue until the femoral sheath was encountered.  The femoral sheath was incised sharply.  The femoral arteries were exposed and encircled with Silastic Vesseloops.   A generous midline laparotomy was made from xiphoid to pubis.  Incision was carried down through subcutaneous tissue until the anterior rectus fascia was encountered.  This was divided in the midline throughout the lengthy incision.  Peritoneum was sharply incised and entered atraumatically.  Moistened operating room towels were used to protect the abdominal wall.  A Balfour clamp retracted the abdominal wall laterally.  The transverse colon was eviscerated cranially.  The small bowel was eviscerated to the right.  A heavily calcified abdominal aorta was  palpated in the retroperitoneal  space.  Using Bovie electrocautery I incised the retroperitoneal space onto the aorta.  The aorta was then exposed on the anterior surface from the aortic bifurcation to the takeoff of the renal arteries.  The aorta was then circumferentially exposed immediately distal to the takeoff of the renal arteries.  A blunt right angle clamp was passed into the aorta and umbilical tape delivered around the aorta.  A Harkenson clamp was slotted to fit into the infrarenal aorta.  A DeBakey aortic clamp was slotted to the more distal infrarenal aorta for distal control.  Using blunt dissection, tunnels were created connecting the femoral exposures to the aortic bifurcation using digital dissection in standard technique.  Ring forceps were delivered through the tunnels and umbilical tape was delivered through the tunnels to hold her space bilaterally.  Left greater saphenous vein was harvested using skip incisions.  Dr. Chestine Spore will dictate this separately.  The left below-knee artery was exposed with standard technique using a medial calf incision.  Incision was carried down through subcutaneous tissue until the fascia of the posterior calf was encountered.  This was incised sharply.  The gastrocnemius was swept posteriorly.  The popliteal neurovascular bundle was identified and skeletonized.  The popliteal artery was encircled proximally and distally.  Using a sheath tunneling device I created a anatomic tunnel connecting the below-knee popliteal artery exposure on the left to the left common femoral artery exposure.  The patient was systemically heparinized.  Activated clotting time measurements were used throughout the case to confirm adequate anticoagulation.  The infrarenal aorta was crossclamped proximally and distally.  The aorta was transected.  An aortic endarterectomy was required to create a safe anastomosis proximally.  This will be dictated separately by Dr. Chestine Spore  The  distal stump of infrarenal aorta was oversewn using 2 layers of 3-0 Prolene suture.  The proximal anastomosis was fashioned between the infrarenal abdominal aorta and a 16 x 8 mm bifurcated Dacron vascular graft in continuous running suture of 3-0 Prolene, buttressed by a felt strip.  The anastomosis was completed and clamps were released.  2 areas of bleeding were noted on the posterior surface of the infrarenal aorta.  These were repaired with interrupted pledgeted 3-0 Prolene suture.  Stasis was achieved.  A clamp was applied to the distal graft.  Clamp was released on the distal infrarenal aorta.  This was also found to be hemostatic.  We delivered the limbs of the bifurcated graft through the previously made tunnels using the umbilical tape.  The bilateral common femoral and profunda femoris arteries were endarterectomized.  Dr. Chestine Spore will dictate the left femoral endarterectomy and profundoplasty separately.  On the right, I clamped the external iliac artery, profunda femoris artery, and superficial femoral artery.  A longitudinal arteriotomy was made with 11 blade and lengthened with Potts scissors.  Extensive plaque extending onto the origin of the profunda femoris artery was noted.  This was endarterectomized using standard technique.  A Freer dissector was used to remove all plaque and loose debris in the femoral artery.  Extensive attention was required at the origin of the profunda femoris artery as this was his only runoff to his leg.  I did have to extend the endarterectomy onto the origin of the profunda femoris artery and perform eversion endarterectomy here.  The distal limbs of the Dacron graft were then sewn his patches to the endarterectomize femoral arteries.  This was done using continuous running suture of 5-0 Prolene.  Immediately prior to completion the  anastomoses were flushed and de-aired.  Clamps were released and hemostasis ensured in both anastomoses.  Greater saphenous vein was  passed off the table and prepared on the back table by Dr. Chestine Spore.  He will dictate this separately.  Once prepared the greater saphenous vein was brought onto the table.  The left limb of the aortobifemoral bypass was used as a inflow anastomosis.  The femoral vessels and the limb were clamped.  An anterior graftotomy was made with 11 blade and extended with Potts scissors.  The greater saphenous vein was anastomosed to the graftotomy in a nonreversed configuration using continuous running suture of 6-0 Prolene. Clamps were released on the aortobifemoral bypass and femoral vessels.  Hemostasis was achieved at the proximal anastomosis.  Using a [employer] I performed valve lysis of the greater saphenous vein.  After lysis, pulsatile flow was noted through the end of the graft.  The graft was clamped distally.  An ink mark was used on the anterior surface of the greater saphenous vein conduit.  The vein was delivered through the previously placed sheath tunneling device taking great care to avoid twisting or kinking the graft.  The graft was then clamped immediately distal to the anastomosis.  The proximal and distal Silastic Vesseloops on the below-knee popliteal artery were secured to occlude the artery.  An anterior lateral arteriotomy was made using 11 blade and extended with Potts scissors.  The distal end of the greater saphenous vein was spatulated to allow end-to-side anastomosis to the popliteal arteriotomy.  This was made with a continuous running suture of 6-0 Prolene.  Immediately prior to completion the graft was flushed and de-aired.  The anastomosis was completed.  Clamps were released on the artery.    Palpable pulse was noted in the distal graft.  Nearly triphasic flow was noted in the popliteal artery distal to the graft.  A biphasic anterior tibial artery was heard which nearly obliterated with occlusion of the bypass.  Unfortunately no Doppler flow was detected in the right tibial  vessels.  I elected to bring a C arm into the room and perform on table angiography.  Using micropuncture technique the hoods of the femoral anastomosis of the aortobifemoral bypass were both accessed using micropuncture technique.  A preplaced U stitch of 5-0 Prolene was used to create hemostasis after removing the micro sheath.  The micro sheath were then used to perform on table angiography.  On the right the aortobifemoral anastomosis appeared technically good, there was no flow-limiting lesion into the profunda femoris artery.  Angiographic flow was noted in the peroneal and anterior tibial artery on the right.  On the left no technical defect was noted in the left aortobifemoral anastomosis or the proximal femoral-popliteal anastomosis.  Another angiogram was taken on the left at the distal anastomosis.  This revealed no technical defect.  Satisfied, we ended the case here.  The wounds were all copiously irrigated.  The retroperitoneum was closed using continuous running suture of 3-0 Vicryl.  The abdominal wall was closed using interrupted figure-of-eight 0 Vicryl sutures in a continuous running suture of 0 PDS.  The skin was closed using 3-0 Vicryl and 4-0 Monocryl.  The bilateral groin and left leg incisions were closed in layers using 2-0 Vicryl, 3-0 Vicryl, 4-0 Monocryl.  Upon completion of the case instrument and sharps counts were confirmed correct. The patient was transferred to the PACU in good condition. I was present for all portions of the procedure.  Rande Brunt.  Lenell Antu, MD [facility] Phone Number: [phone] 09/24/2020 3:46 PM

## 2020-09-24 NOTE — Transfer of Care (Signed)
Immediate Anesthesia Transfer of Care Note  Patient: Mark Dickerson  Procedure(s) Performed: AORTA BIFEMORAL BYPASS GRAFT (N/A ) LEFT FEMORAL ARTERY TO BELOW KNEE POPLITEAL ARTERY BY PASS GRAFT USING NON REVERSED SAPPENOUS VEIN. (Left ) INTRA OPERATIVE ARTERIOGRAM RIGHT AND LEFT LEG (Bilateral ) ENDARTERECTOMY BILATERAL COMMON FEMORAL ARTERIES (Bilateral ) AORTIC ENDARTERECETOMY (N/A ) VEIN HARVEST LEFT GREATER SAPPHENOUS (Left ) BILATERAL PROFUNDOPLASTY, COMMON FEMORALS (Bilateral )  Patient Location: PACU  Anesthesia Type:General  Level of Consciousness: awake, alert  and patient cooperative  Airway & Oxygen Therapy: Patient Spontanous Breathing and Patient connected to face mask  Post-op Assessment: Report given to RN, Post -op Vital signs reviewed and stable and Patient moving all extremities X 4  Post vital signs: Reviewed and stable  Last Vitals:  Vitals Value Taken Time  BP 112/59 09/24/20 1601  Temp    Pulse 74 09/24/20 1609  Resp 16 09/24/20 1609  SpO2 100 % 09/24/20 1609  Vitals shown include unvalidated device data.  Last Pain:  Vitals:   09/24/20 0432  TempSrc: Oral  PainSc: 3       Patients Stated Pain Goal: 5 (09/19/20 1209)  Complications: No complications documented.

## 2020-09-24 NOTE — Op Note (Deleted)
[facility] [place] PROGRESS NOTE  ASSESSMENT / PLAN: Mark Dickerson is a 75 y.o. 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  For aortobifemoral bypass, left femoral popliteal bypass today   SUBJECTIVE: No complaints. All questions answered.  OBJECTIVE: BP (!) 176/82 (BP Location: Left Arm)   Pulse 68   Temp 97.8 F (36.6 C) (Oral)   Resp 11   Ht 5\' 11"  (1.803 m)   Wt 83.9 kg   SpO2 98%   BMI 25.80 kg/m   Intake/Output Summary (Last 24 hours) at 09/24/2020 0719 Last data filed at 09/24/2020 09/26/2020 Gross per 24 hour  Intake 761.13 ml  Output 1550 ml  Net -788.87 ml    Constitutional: well appearing. no acute distress. Cardiac: RRR. Pulmonary: unlabored Abdomen: soft Vascular: unchanged appearance of left foot  CBC Latest Ref Rng & Units 09/24/2020 09/20/2020 09/19/2020  WBC 4.0 - 10.5 K/uL 7.8 7.7 -  Hemoglobin 13.0 - 17.0 g/dL 12.9(L) 13.9 15.3  Hematocrit 39.0 - 52.0 % 37.0(L) 39.5 45.0  Platelets 150 - 400 K/uL 221 245 -     CMP Latest Ref Rng & Units 09/24/2020 09/20/2020 09/19/2020  Glucose 70 - 99 mg/dL 11/19/2020) 242(P) 536(R)  BUN 8 - 23 mg/dL 11 22 443(X)  Creatinine 0.61 - 1.24 mg/dL 54(M 0.86) 7.61(P  Sodium 135 - 145 mmol/L 132(L) 131(L) 131(L)  Potassium 3.5 - 5.1 mmol/L 4.4 3.9 4.3  Chloride 98 - 111 mmol/L 103 100 99  CO2 22 - 32 mmol/L 24 21(L) -  Calcium 8.9 - 10.3 mg/dL 9.1 9.2 -  Total Protein 6.5 - 8.1 g/dL - 6.3(L) -  Total Bilirubin 0.3 - 1.2 mg/dL - 0.4 -  Alkaline Phos 38 - 126 U/L - 43 -  AST 15 - 41 U/L - 18 -  ALT 0 - 44 U/L - 17 -    Estimated Creatinine Clearance: 58.5 mL/min (by C-G formula based on SCr of 1.18 mg/dL).  5.09. Rande Brunt, MD [facility] Phone Number: [phone] 09/24/2020 7:19 AM

## 2020-09-25 ENCOUNTER — Inpatient Hospital Stay (HOSPITAL_COMMUNITY): Payer: Medicare HMO

## 2020-09-25 ENCOUNTER — Encounter (HOSPITAL_COMMUNITY): Payer: Self-pay | Admitting: Vascular Surgery

## 2020-09-25 LAB — COMPREHENSIVE METABOLIC PANEL
ALT: 13 U/L (ref 0–44)
AST: 19 U/L (ref 15–41)
Albumin: 2.8 g/dL — ABNORMAL LOW (ref 3.5–5.0)
Alkaline Phosphatase: 29 U/L — ABNORMAL LOW (ref 38–126)
Anion gap: 8 (ref 5–15)
BUN: 11 mg/dL (ref 8–23)
CO2: 19 mmol/L — ABNORMAL LOW (ref 22–32)
Calcium: 7.9 mg/dL — ABNORMAL LOW (ref 8.9–10.3)
Chloride: 109 mmol/L (ref 98–111)
Creatinine, Ser: 1.07 mg/dL (ref 0.61–1.24)
GFR, Estimated: >60 mL/min (ref >60)
Glucose, Bld: 147 mg/dL — ABNORMAL HIGH (ref 70–99)
Potassium: 4.2 mmol/L (ref 3.5–5.1)
Sodium: 136 mmol/L (ref 135–145)
Total Bilirubin: 0.7 mg/dL (ref 0.3–1.2)
Total Protein: 4.8 g/dL — ABNORMAL LOW (ref 6.5–8.1)

## 2020-09-25 LAB — CBC
HCT: 26.8 % — ABNORMAL LOW (ref 39.0–52.0)
Hemoglobin: 9 g/dL — ABNORMAL LOW (ref 13.0–17.0)
MCH: 33.3 pg (ref 26.0–34.0)
MCHC: 33.6 g/dL (ref 30.0–36.0)
MCV: 99.3 fL (ref 80.0–100.0)
Platelets: 163 10*3/uL (ref 150–400)
RBC: 2.7 MIL/uL — ABNORMAL LOW (ref 4.22–5.81)
RDW: 13.7 % (ref 11.5–15.5)
WBC: 7.6 10*3/uL (ref 4.0–10.5)
nRBC: 0 % (ref 0.0–0.2)

## 2020-09-25 LAB — GLUCOSE, CAPILLARY
Glucose-Capillary: 102 mg/dL — ABNORMAL HIGH (ref 70–99)
Glucose-Capillary: 129 mg/dL — ABNORMAL HIGH (ref 70–99)
Glucose-Capillary: 133 mg/dL — ABNORMAL HIGH (ref 70–99)
Glucose-Capillary: 139 mg/dL — ABNORMAL HIGH (ref 70–99)
Glucose-Capillary: 146 mg/dL — ABNORMAL HIGH (ref 70–99)
Glucose-Capillary: 150 mg/dL — ABNORMAL HIGH (ref 70–99)

## 2020-09-25 LAB — HEMOGLOBIN A1C
Hgb A1c MFr Bld: 7 % — ABNORMAL HIGH (ref 4.8–5.6)
Mean Plasma Glucose: 154.2 mg/dL

## 2020-09-25 LAB — MAGNESIUM: Magnesium: 2 mg/dL (ref 1.7–2.4)

## 2020-09-25 LAB — AMYLASE: Amylase: 20 U/L — ABNORMAL LOW (ref 28–100)

## 2020-09-25 MED ORDER — INSULIN ASPART 100 UNIT/ML ~~LOC~~ SOLN
0.0000 [IU] | Freq: Three times a day (TID) | SUBCUTANEOUS | Status: DC
  Administered 2020-09-25 – 2020-09-28 (×6): 2 [IU] via SUBCUTANEOUS

## 2020-09-25 MED ORDER — LACTATED RINGERS IV SOLN: INTRAVENOUS | Status: DC

## 2020-09-25 MED ORDER — LACTATED RINGERS IV BOLUS
1000.0000 mL | Freq: Once | INTRAVENOUS | Status: AC
  Administered 2020-09-25: 1000 mL via INTRAVENOUS

## 2020-09-25 NOTE — Evaluation (Signed)
Physical Therapy Evaluation Patient Details Name: Mark Dickerson MRN: [id number] DOB: Dec 19, 1945 Today's Date: 09/25/2020   History of Present Illness  75 yo presented to vascular office 3/8 due to left foot gangrene. Pt admitted 3/9 for aortogram with extensive vascular sx 3/14 including aortobifem bPG, left fem pop BPG, endarterectomy of aorta and bil common femoral arteries. PMhx: DM, HTN, HLD   Clinical Impression  Pt pleasant and willing to get OOB this session but limited by pain and dizziness. Pt lives alone but plans to live with daughter at D/C initially. Pt with decreased strength, mobility, gait and function who will benefit from acute therapy to maximize mobility, safety and independence prior to D/c. Encouraged continued mobility to chair throughout the day with nursing and HEP.   BP via art line 129/57 (76) supine Standing with gait 82/37 In chair 145/49 (67) HR 108 SpO2 100% RA     Follow Up Recommendations Home health PT    Equipment Recommendations  Rolling walker with 5" wheels;3in1 (PT)    Recommendations for Other Services OT consult     Precautions / Restrictions Precautions Precautions: Fall Precaution Comments: watch BP (dizzy on eval), abdominal incision Restrictions Weight Bearing Restrictions: No      Mobility  Bed Mobility Overal bed mobility: Needs Assistance Bed Mobility: Rolling;Sidelying to Sit Rolling: Min assist Sidelying to sit: Min assist       General bed mobility comments: Pt up in recliner upon my arrival    Transfers Overall transfer level: Needs assistance   Transfers: Sit to/from Stand Sit to Stand: Min assist         General transfer comment: Deferred due to pt c/o of dizziness/wooziness when his legs were put down from reclined position and he had also just gotten IV pain meds as well, BP was soft with legs down 96/59 and up 107/63.  Ambulation/Gait Ambulation/Gait assistance: Min guard Gait Distance (Feet): 5  Feet Assistive device: Rolling walker (2 wheeled) Gait Pattern/deviations: Step-through pattern;Decreased stride length   Gait velocity interpretation: <1.8 ft/sec, indicate of risk for recurrent falls General Gait Details: cues for posture and proximity to RW. Pt initiated gait but after 5' reported dizziness with pt backing to chair with drop in BP and seated  Stairs            Wheelchair Mobility    Modified Rankin (Stroke Patients Only)       Balance Overall balance assessment: Needs assistance   Sitting balance-Leahy Scale: Good     Standing balance support: Bilateral upper extremity supported Standing balance-Leahy Scale: Poor Standing balance comment: bil UE support for standing due to pain                             Pertinent Vitals/Pain Pain Assessment: 0-10 Pain Score: 8  Pain Location: abdominal incision Pain Descriptors / Indicators: Aching;Sore Pain Intervention(s): Limited activity within patient's tolerance;Monitored during session;Repositioned;Premedicated before session    Home Living Family/patient expects to be discharged to:: Private residence Living Arrangements: Alone   Type of Home: House Home Access: Stairs to enter Entrance Stairs-Rails: Left Entrance Stairs-Number of Steps: 2 Home Layout: One level Home Equipment: Grab bars - tub/shower Additional Comments: Going to go stay at his dtrs house    Prior Function Level of Independence: Independent               Hand Dominance   Dominant Hand: Right    Extremity/Trunk Assessment  Upper Extremity Assessment Upper Extremity Assessment: Overall WFL for tasks assessed    Lower Extremity Assessment Lower Extremity Assessment: Overall WFL for tasks assessed    Cervical / Trunk Assessment Cervical / Trunk Assessment: Other exceptions Cervical / Trunk Exceptions: guarding at abdomen post op  Communication   Communication: No difficulties  Cognition  Arousal/Alertness: Awake/alert Behavior During Therapy: WFL for tasks assessed/performed Overall Cognitive Status: Within Functional Limits for tasks assessed                                 General Comments: He did repeat every question I asked him before he answered it. He also go IV pain meds right before I started working with him      General Comments      Exercises General Exercises - Lower Extremity Long Arc Quad: AROM;Both;Seated;10 reps Hip Flexion/Marching: AROM;Both;10 reps;Seated   Assessment/Plan    PT Assessment Patient needs continued PT services  PT Problem List Decreased strength;Decreased mobility;Decreased range of motion;Decreased activity tolerance;Decreased balance;Decreased knowledge of use of DME;Pain;Cardiopulmonary status limiting activity       PT Treatment Interventions Gait training;Functional mobility training;Therapeutic activities;Patient/family education;DME instruction;Therapeutic exercise    PT Goals (Current goals can be found in the Care Plan section)  Acute Rehab PT Goals Patient Stated Goal: return home PT Goal Formulation: With patient/family Time For Goal Achievement: 10/09/20 Potential to Achieve Goals: Good    Frequency Min 3X/week   Barriers to discharge        Co-evaluation               AM-PAC PT "6 Clicks" Mobility  Outcome Measure Help needed turning from your back to your side while in a flat bed without using bedrails?: A Little Help needed moving from lying on your back to sitting on the side of a flat bed without using bedrails?: A Little Help needed moving to and from a bed to a chair (including a wheelchair)?: A Little Help needed standing up from a chair using your arms (e.g., wheelchair or bedside chair)?: A Little Help needed to walk in hospital room?: A Little Help needed climbing 3-5 steps with a railing? : A Lot 6 Click Score: 17    End of Session   Activity Tolerance: Patient tolerated  treatment well Patient left: in chair;with family/visitor present;with nursing/sitter in room;with call bell/phone within reach Nurse Communication: Mobility status PT Visit Diagnosis: Other abnormalities of gait and mobility (R26.89);Difficulty in walking, not elsewhere classified (R26.2);Muscle weakness (generalized) (M62.81)    Time: 0370-4888 PT Time Calculation (min) (ACUTE ONLY): 24 min   Charges:   PT Evaluation $PT Eval Moderate Complexity: 1 Mod PT Treatments $Therapeutic Activity: 8-22 mins        Jerzy Crotteau P, PT Acute Rehabilitation Services Pager: [phone] Office: [phone]   Brianna Bennett B Corrinna Karapetyan 09/25/2020, 10:26 AM

## 2020-09-25 NOTE — Progress Notes (Addendum)
[facility] [place]  Subjective  - Alert and oriented.  Pain issues and dry mouth.   Objective 106/64 76 97.8 F (36.6 C) (Oral) 12 99%  Intake/Output Summary (Last 24 hours) at 09/25/2020 0714 Last data filed at 09/25/2020 0600 Gross per 24 hour  Intake 7569.81 ml  Output 2575 ml  Net 4994.81 ml    Abdomin soft, minimal BS, no flatus Doppler signal Left DP/PT, right PT Abdominal incision healing well, B groin soft Lungs non labored breathing  Assessment/Planning: POD # Aortobifemoral bypass and left fem below knee popliteal artery bypass with non-reversed greater saphenous vein in anatomic tunnel  He does have minimal BS I will allow small amounts of ice chips. Urine op >1200, Cr 1.09 HGB 9.0 surgical blood loss anemia stable Hyperglycemia placed order for sliding scale Pain control and mobility goals of care   Mosetta Pigeon 09/25/2020 7:14 AM --  Laboratory Lab Results: Recent Labs    09/24/20 1622 09/25/20 0427  WBC 10.8* 7.6  HGB 10.4* 9.0*  HCT 30.4* 26.8*  PLT 184 163   BMET Recent Labs    09/24/20 1622 09/25/20 0427  NA 135 136  K 4.7 4.2  CL 108 109  CO2 19* 19*  GLUCOSE 188* 147*  BUN 11 11  CREATININE 1.09 1.07  CALCIUM 8.0* 7.9*    COAG Lab Results  Component Value Date   INR 1.3 (H) 09/24/2020   INR 1.1 09/24/2020   INR 1.0 08/31/2020   No results found for: PTT  VASCULAR STAFF ADDENDUM: I have independently interviewed and examined the patient. I agree with the above.  Looks good POD#1 s/p ABF and LFPBG with non-reversed ipsilateral GSV PRN pain control PT / OT / OOB / Ambulate TID OK for sips / chips Keep foley one more day SSI. Keep BP 120-180. OK for transfer to 4E.  Rande Brunt. Lenell Antu, MD [facility] Phone Number: [phone] 09/25/2020 12:37 PM

## 2020-09-25 NOTE — Plan of Care (Signed)
  Problem: Clinical Measurements: Goal: Diagnostic test results will improve Outcome: Progressing Goal: Cardiovascular complication will be avoided Outcome: Progressing   Problem: Activity: Goal: Risk for activity intolerance will decrease Outcome: Progressing   Problem: Coping: Goal: Level of anxiety will decrease Outcome: Progressing   Problem: Pain Managment: Goal: General experience of comfort will improve Outcome: Progressing   Problem: Safety: Goal: Ability to remain free from injury will improve Outcome: Progressing   Problem: Skin Integrity: Goal: Risk for impaired skin integrity will decrease Outcome: Progressing

## 2020-09-25 NOTE — Evaluation (Signed)
Occupational Therapy Evaluation Patient Details Name: Mark Dickerson MRN: [id number] DOB: [birth year] Today's Date: 09/25/2020    History of Present Illness 75 yo presented to vascular office 3/8 due to left foot gangrene. Pt admitted 3/9 for aortogram with extensive vascular sx 3/14 including aortobifem bPG, left fem pop BPG, endarterectomy of aorta and bil common femoral arteries. PMhx: DM, HTN, HLD.   Clinical Impression   This 75 yo male admitted and underwent above presents to acute OT with PLOF of being totally independent and living by himself. He currently is limited by pain, dizziness/wooziness, decreased endurance for activity, and decreased mobility/flexibility for basic ADLs. He plans on going to his dtrs house first. He will continue to benefit from acute OT with follow up HHOT.    Follow Up Recommendations  Home health OT;Supervision/Assistance - 24 hour    Equipment Recommendations  3 in 1 bedside commode       Precautions / Restrictions Precautions Precautions: Fall Precaution Comments: watch BP (dizzy on eval), abdominal incision Restrictions Weight Bearing Restrictions: No      Mobility Bed Mobility   General bed mobility comments: Pt up in recliner upon my arrival    Transfers       General transfer comment: Deferred due to pt c/o of dizziness/wooziness when his legs were put down from reclined position and he had also just gotten IV pain meds as well, BP was soft with legs down 96/59 and up 107/63.        ADL either performed or assessed with clinical judgement   ADL Overall ADL's : Needs assistance/impaired Eating/Feeding: Independent;Sitting   Grooming: Set up;Sitting   Upper Body Bathing: Set up;Sitting   Lower Body Bathing: Maximal assistance;Sitting/lateral leans   Upper Body Dressing : Moderate assistance;Sitting   Lower Body Dressing: Total assistance;Sitting/lateral leans                       Vision Patient Visual Report: No  change from baseline              Pertinent Vitals/Pain Pain Assessment: 0-10 Pain Score: 8  Pain Location: abdominal incision Pain Descriptors / Indicators: Aching;Sore Pain Intervention(s): Limited activity within patient's tolerance;Monitored during session;Repositioned;Premedicated before session     Hand Dominance Right   Extremity/Trunk Assessment Upper Extremity Assessment Upper Extremity Assessment: Overall WFL for tasks assessed     Communication Communication Communication: No difficulties   Cognition Arousal/Alertness: Awake/alert Behavior During Therapy: WFL for tasks assessed/performed Overall Cognitive Status: Within Functional Limits for tasks assessed                                 General Comments: He did repeat every question I asked him before he answered it. He also go IV pain meds right before I started working with him              Home Living Family/patient expects to be discharged to:: Private residence Living Arrangements: Alone   Type of Home: House Home Access: Stairs to enter Secretary/administrator of Steps: 2 Entrance Stairs-Rails: Left Home Layout: One level     Bathroom Shower/Tub: Walk-in Pensions consultant: Standard     Home Equipment: Grab bars - tub/shower   Additional Comments: Going to go stay at his dtrs house      Prior Functioning/Environment Level of Independence: Independent  OT Problem List: Decreased range of motion;Impaired balance (sitting and/or standing);Pain;Decreased knowledge of use of DME or AE      OT Treatment/Interventions: Self-care/ADL training;DME and/or AE instruction;Patient/family education;Balance training    OT Goals(Current goals can be found in the care plan section) Acute Rehab OT Goals Patient Stated Goal: return home OT Goal Formulation: With patient Time For Goal Achievement: 10/09/20 Potential to Achieve Goals: Good  OT Frequency:  Min 2X/week              AM-PAC OT "6 Clicks" Daily Activity     Outcome Measure Help from another person eating meals?: None Help from another person taking care of personal grooming?: A Little Help from another person toileting, which includes using toliet, bedpan, or urinal?: A Lot Help from another person bathing (including washing, rinsing, drying)?: A Lot Help from another person to put on and taking off regular upper body clothing?: A Lot Help from another person to put on and taking off regular lower body clothing?: Total 6 Click Score: 14   End of Session Nurse Communication:  (pt with wozziness in chair)  Activity Tolerance:  (limited by dizziness/wooziness just sitting in recliner) Patient left: in chair;with call bell/phone within reach;with family/visitor present  OT Visit Diagnosis: Unsteadiness on feet (R26.81);Other abnormalities of gait and mobility (R26.89);Muscle weakness (generalized) (M62.81);Pain Pain - part of body:  (adominal incision)                Time: 9604-5409 OT Time Calculation (min): 17 min Charges:  OT General Charges $OT Visit: 1 Visit OT Evaluation $OT Eval Moderate Complexity: 1 Mod Ignacia Palma, OTR/L Acute [employer] Pager [phone] Office [phone]     Evette Georges 09/25/2020, 10:28 AM

## 2020-09-26 ENCOUNTER — Inpatient Hospital Stay: Payer: Self-pay

## 2020-09-26 DIAGNOSIS — E1169 Type 2 diabetes mellitus with other specified complication: Secondary | ICD-10-CM

## 2020-09-26 DIAGNOSIS — I7409 Other arterial embolism and thrombosis of abdominal aorta: Secondary | ICD-10-CM

## 2020-09-26 DIAGNOSIS — I214 Non-ST elevation (NSTEMI) myocardial infarction: Secondary | ICD-10-CM

## 2020-09-26 DIAGNOSIS — D649 Anemia, unspecified: Secondary | ICD-10-CM

## 2020-09-26 LAB — CBC
HCT: 24.7 % — ABNORMAL LOW (ref 39.0–52.0)
Hemoglobin: 8.4 g/dL — ABNORMAL LOW (ref 13.0–17.0)
MCH: 34 pg (ref 26.0–34.0)
MCHC: 34 g/dL (ref 30.0–36.0)
MCV: 100 fL (ref 80.0–100.0)
Platelets: 169 10*3/uL (ref 150–400)
RBC: 2.47 MIL/uL — ABNORMAL LOW (ref 4.22–5.81)
RDW: 14 % (ref 11.5–15.5)
WBC: 9.2 10*3/uL (ref 4.0–10.5)
nRBC: 0 % (ref 0.0–0.2)

## 2020-09-26 LAB — GLUCOSE, CAPILLARY
Glucose-Capillary: 142 mg/dL — ABNORMAL HIGH (ref 70–99)
Glucose-Capillary: 127 mg/dL — ABNORMAL HIGH (ref 70–99)
Glucose-Capillary: 120 mg/dL — ABNORMAL HIGH (ref 70–99)
Glucose-Capillary: 113 mg/dL — ABNORMAL HIGH (ref 70–99)

## 2020-09-26 LAB — BASIC METABOLIC PANEL
Anion gap: 8 (ref 5–15)
BUN: 15 mg/dL (ref 8–23)
CO2: 19 mmol/L — ABNORMAL LOW (ref 22–32)
Calcium: 8.1 mg/dL — ABNORMAL LOW (ref 8.9–10.3)
Chloride: 111 mmol/L (ref 98–111)
Creatinine, Ser: 1.24 mg/dL (ref 0.61–1.24)
GFR, Estimated: >60 mL/min (ref >60)
Glucose, Bld: 125 mg/dL — ABNORMAL HIGH (ref 70–99)
Potassium: 4.2 mmol/L (ref 3.5–5.1)
Sodium: 138 mmol/L (ref 135–145)

## 2020-09-26 LAB — TROPONIN I (HIGH SENSITIVITY)
Troponin I (High Sensitivity): 2050 ng/L (ref <18)
Troponin I (High Sensitivity): 2219 ng/L (ref <18)
Troponin I (High Sensitivity): 2056 ng/L (ref <18)
Troponin I (High Sensitivity): 1860 ng/L (ref <18)
Troponin I (High Sensitivity): 1913 ng/L (ref <18)

## 2020-09-26 LAB — HEPARIN LEVEL (UNFRACTIONATED): Heparin Unfractionated: <0.1 IU/mL — ABNORMAL LOW (ref 0.30–0.70)

## 2020-09-26 MED ORDER — SODIUM CHLORIDE 0.9% FLUSH
10.0000 mL | INTRAVENOUS | Status: DC | PRN
Start: 2020-09-26 — End: 2020-09-28

## 2020-09-26 MED ORDER — OXYCODONE HCL 5 MG PO TABS
5.0000 mg | ORAL_TABLET | ORAL | Status: DC | PRN
  Administered 2020-09-26: 5 mg via ORAL
  Administered 2020-09-26 – 2020-09-27 (×3): 10 mg via ORAL
  Filled 2020-09-26 (×2): qty 2
  Filled 2020-09-26: qty 1
  Filled 2020-09-26: qty 2

## 2020-09-26 MED ORDER — ACETAMINOPHEN 325 MG PO TABS
650.0000 mg | ORAL_TABLET | Freq: Four times a day (QID) | ORAL | Status: DC
  Administered 2020-09-26 – 2020-09-28 (×9): 650 mg via ORAL
  Filled 2020-09-26 (×10): qty 2

## 2020-09-26 MED ORDER — HEPARIN (PORCINE) 25000 UT/250ML-% IV SOLN
1250.0000 [IU]/h | INTRAVENOUS | Status: AC
  Administered 2020-09-26: 1000 [IU]/h via INTRAVENOUS
  Administered 2020-09-27: 1250 [IU]/h via INTRAVENOUS
  Filled 2020-09-26 (×2): qty 250

## 2020-09-26 MED ORDER — METOPROLOL TARTRATE 12.5 MG HALF TABLET
12.5000 mg | ORAL_TABLET | Freq: Two times a day (BID) | ORAL | Status: DC
  Administered 2020-09-26: 12.5 mg via ORAL
  Filled 2020-09-26: qty 1

## 2020-09-26 MED ORDER — SODIUM CHLORIDE 0.9% FLUSH
10.0000 mL | Freq: Two times a day (BID) | INTRAVENOUS | Status: DC
  Administered 2020-09-26 – 2020-09-28 (×4): 10 mL

## 2020-09-26 MED ORDER — FUROSEMIDE 10 MG/ML IJ SOLN
60.0000 mg | Freq: Once | INTRAMUSCULAR | Status: AC
  Administered 2020-09-26: 60 mg via INTRAVENOUS
  Filled 2020-09-26: qty 6

## 2020-09-26 MED ORDER — OXYCODONE-ACETAMINOPHEN 5-325 MG PO TABS
1.0000 | ORAL_TABLET | ORAL | Status: DC | PRN
Start: 2020-09-26 — End: 2020-09-28
  Administered 2020-09-26: 2 via ORAL
  Filled 2020-09-26: qty 2

## 2020-09-26 MED ORDER — ALBUMIN HUMAN 25 % IV SOLN
50.0000 g | Freq: Once | INTRAVENOUS | Status: AC
  Administered 2020-09-26: 50 g via INTRAVENOUS
  Filled 2020-09-26: qty 200

## 2020-09-26 MED FILL — Heparin Sodium (Porcine) Inj 1000 Unit/ML: INTRAMUSCULAR | Qty: 30 | Status: AC

## 2020-09-26 MED FILL — Sodium Chloride Irrigation Soln 0.9%: Qty: 3000 | Status: AC

## 2020-09-26 MED FILL — Sodium Chloride IV Soln 0.9%: INTRAVENOUS | Qty: 1000 | Status: AC

## 2020-09-26 NOTE — Progress Notes (Signed)
Occupational Therapy Treatment Patient Details Name: Mark Dickerson MRN: [id number] DOB: 05-Mar-1946 Today's Date: 09/26/2020    History of present illness 75 yo presented to vascular office 3/8 due to left foot gangrene. Pt admitted 3/9 for aortogram with extensive vascular sx 3/14 including aortobifem bPG, left fem pop BPG, endarterectomy of aorta and bil common femoral arteries. PMhx: DM, HTN, HLD   OT comments  Patient with good progress toward all stated OT goals this date.  Patient continues to rate his pain 6/10, but is experiencing less dizziness today.  As a result he is moving a little better, needing up to Min A for sit to stand.  He was able to complete upper body HEP seated with demo and supervision.  He completed 3 breaths with the inspirometer.  OT will continue in the acute setting to maximize his functional status for an eventual return to his daughter's home with [facility] services as needed.    Follow Up Recommendations  Home health OT;Supervision/Assistance - 24 hour    Equipment Recommendations  3 in 1 bedside commode    Recommendations for Other Services      Precautions / Restrictions Precautions Precautions: Fall Restrictions Weight Bearing Restrictions: No       Mobility Bed Mobility               General bed mobility comments: Pt up in recliner upon my arrival Patient Response: Cooperative  Transfers Overall transfer level: Needs assistance   Transfers: Sit to/from Stand Sit to Stand: Min assist         General transfer comment: assist with scooting hips forward    Balance Overall balance assessment: Needs assistance Sitting-balance support: Feet supported Sitting balance-Leahy Scale: Good     Standing balance support: Bilateral upper extremity supported   Standing balance comment: bil UE support for standing due to pain                             Cognition Arousal/Alertness: Awake/alert Behavior During Therapy: Anxious Overall  Cognitive Status: Within Functional Limits for tasks assessed                                          Exercises General Exercises - Upper Extremity Shoulder Flexion: AROM;Both;10 reps Shoulder Extension: AROM;Both;10 reps Elbow Flexion: AROM;Both;10 reps Elbow Extension: AROM;Both;10 reps Wrist Flexion: AROM;15 reps;Both Wrist Extension: AROM;15 reps;Both Digit Composite Flexion: AROM;Both;15 reps Composite Extension: AROM;Both;15 reps Chair Push Up: 5 reps;Seated   Shoulder Instructions       General Comments      Pertinent Vitals/ Pain       Pain Assessment: Faces Faces Pain Scale: Hurts even more Pain Location: abdominal incision Pain Descriptors / Indicators: Tender Pain Intervention(s): Monitored during session                                                          Frequency  Min 2X/week        Progress Toward Goals  OT Goals(current goals can now be found in the care plan section)  Progress towards OT goals: Progressing toward goals  Acute Rehab OT Goals Patient Stated Goal: I 'd  like to move better, but all these cords. OT Goal Formulation: With patient Time For Goal Achievement: 10/09/20 Potential to Achieve Goals: Good  Plan Discharge plan remains appropriate    Co-evaluation                 AM-PAC OT "6 Clicks" Daily Activity     Outcome Measure   Help from another person eating meals?: None Help from another person taking care of personal grooming?: None Help from another person toileting, which includes using toliet, bedpan, or urinal?: A Lot Help from another person bathing (including washing, rinsing, drying)?: A Lot Help from another person to put on and taking off regular upper body clothing?: A Lot Help from another person to put on and taking off regular lower body clothing?: A Lot 6 Click Score: 16    End of Session Equipment Utilized During Treatment: Rolling walker  OT Visit  Diagnosis: Unsteadiness on feet (R26.81);Other abnormalities of gait and mobility (R26.89);Muscle weakness (generalized) (M62.81);Pain   Activity Tolerance Patient tolerated treatment well   Patient Left in chair;with call bell/phone within reach   Nurse Communication Mobility status        Time: 5170-0174 OT Time Calculation (min): 23 min  Charges: OT General Charges $OT Visit: 1 Visit OT Treatments $Therapeutic Activity: 8-22 mins $Therapeutic Exercise: 8-22 mins  09/26/2020  Rich, OTR/L  Acute Rehabilitation Services  Office:  [phone]    Suzanna Obey 09/26/2020, 9:44 AM

## 2020-09-26 NOTE — Progress Notes (Addendum)
[facility]  Subjective  - Feeling a little better today over all.  Left foot pain is much better.   Objective 126/63 95 98.4 F (36.9 C) (Oral) 16 95%  Intake/Output Summary (Last 24 hours) at 09/26/2020 0816 Last data filed at 09/26/2020 0615 Gross per 24 hour  Intake 3857.82 ml  Output 600 ml  Net 3257.82 ml    Doppler right PT, left DP/PT/peroneal signals Abdomin with + BS and passing flatus, incision healing well B groins soft without hematoma Lungs non labored breathing Heart RRR, BP soft 100/67  Assessment/Planning: POD # 2 Aortobifemoral bypass and left fem below knee popliteal artery bypass with non-reversed greater saphenous vein in anatomic tunnel  Established inflow with distal doppler signals intact.    Cr 1.24, urine Op 650 last 24, received bolus of 100, now vol + 8000 since admission.  HGB drop 8.4 surgical blood loss anemia.   Dr, Lenell Antu will review fluid shifts and make recommendations.  Will start clear liquid diet he has passed flatus and has good BS to auscultation.  Mark Dickerson 09/26/2020 8:16 AM --  Laboratory Lab Results: Recent Labs    09/25/20 0427 09/26/20 0057  WBC 7.6 9.2  HGB 9.0* 8.4*  HCT 26.8* 24.7*  PLT 163 169   BMET Recent Labs    09/25/20 0427 09/26/20 0057  NA 136 138  K 4.2 4.2  CL 109 111  CO2 19* 19*  GLUCOSE 147* 125*  BUN 11 15  CREATININE 1.07 1.24  CALCIUM 7.9* 8.1*    COAG Lab Results  Component Value Date   INR 1.3 (H) 09/24/2020   INR 1.1 09/24/2020   INR 1.0 08/31/2020   No results found for: PTT   VASCULAR STAFF ADDENDUM: I have independently interviewed and examined the patient. I agree with the above.  Overall looks good. Scr bump to 1.2. Suspect he is intravascularly dry. Good response to volume challenge last night. Increase rate of IVF today. Give a bottle of 25% albumin. Check afternoon BMP. If no response to volume will start Lasix. Difficult  PIV access. Will ask PICC team to evaluate for PICC or midline. OK for transfer to 4E  Rande Brunt. Lenell Antu, MD [facility] Phone Number: [phone] 09/26/2020 8:39 AM

## 2020-09-26 NOTE — Progress Notes (Signed)
Progress Note  Patient Name: Mark Dickerson Date of Encounter: 09/26/2020  [facility] Cardiologist: No primary care provider on file. Dr. Tresa Endo  We have been reconsulted postoperatively by Dr.Hawken for new onset chest pain with elevated troponin, and volume overload.  Subjective   Currently feels okay, just anxious.  Was having chest tightness focal on the left side earlier today.  Inpatient Medications    Scheduled Meds: . acetaminophen  650 mg Oral Q6H  . Chlorhexidine Gluconate Cloth  6 each Topical Daily  . docusate sodium  100 mg Oral Daily  . insulin aspart  0-15 Units Subcutaneous TID WC  . pantoprazole  40 mg Oral Daily  . sodium chloride flush  10-40 mL Intracatheter Q12H   Continuous Infusions: . heparin 1,000 Units/hr (09/26/20 1800)   PRN Meds: alum & mag hydroxide-simeth, guaiFENesin-dextromethorphan, hydrALAZINE, labetalol, metoprolol tartrate, morphine injection, ondansetron, oxyCODONE, oxyCODONE-acetaminophen, phenol, potassium chloride, sodium chloride flush   Vital Signs    Vitals:   09/26/20 1500 09/26/20 1600 09/26/20 1700 09/26/20 1800  BP: (!) 125/101 113/60  116/62  Pulse: 90 87 (!) 104 86  Resp: 11 12 18 15   Temp: 99 F (37.2 C)     TempSrc: Oral     SpO2: 98% 99% 96% 92%  Weight:      Height:        Intake/Output Summary (Last 24 hours) at 09/26/2020 1915 Last data filed at 09/26/2020 1858 Gross per 24 hour  Intake 2930.51 ml  Output 1800 ml  Net 1130.51 ml   Last 3 Weights 09/26/2020 09/25/2020 09/19/2020  Weight (lbs) 201 lb 4.5 oz 184 lb 15.5 oz 185 lb  Weight (kg) 91.3 kg 83.9 kg 83.915 kg      Telemetry    NSR 93 LBBB- Personally Reviewed  ECG    NSR rate 98, LBBB- Personally Reviewed  Physical Exam   GEN: Somewhat anxious appearing.  Sitting up in chair.  No obvious distress. Neck: No JVD, soft bruit Cardiac: RRR, no murmurs, rubs, or gallops.  Respiratory: Clear to auscultation bilaterally.  Nonlabored, GI: Soft,  nontender, non-distended  MS:  Mild puffy swelling on left hand.  Palpable pulses bilaterally.  Diminished pulses pedal but stable Neuro:  Nonfocal  Psych: Normal affect; anxious  Labs    High Sensitivity Troponin:   Recent Labs  Lab 09/26/20 1251 09/26/20 1507 09/26/20 1634  TROPONINIHS 2,050* 2,219* 2,056*      Chemistry Recent Labs  Lab 09/20/20 0124 09/24/20 0158 09/24/20 1622 09/25/20 0427 09/26/20 0057  NA 131*   < > 135 136 138  K 3.9   < > 4.7 4.2 4.2  CL 100   < > 108 109 111  CO2 21*   < > 19* 19* 19*  GLUCOSE 102*   < > 188* 147* 125*  BUN 22   < > 11 11 15   CREATININE 1.30*   < > 1.09 1.07 1.24  CALCIUM 9.2   < > 8.0* 7.9* 8.1*  PROT 6.3*  --   --  4.8*  --   ALBUMIN 3.2*  --   --  2.8*  --   AST 18  --   --  19  --   ALT 17  --   --  13  --   ALKPHOS 43  --   --  29*  --   BILITOT 0.4  --   --  0.7  --   GFRNONAA 58*   < > >60 >  60 >60  ANIONGAP 10   < > 8 8 8    < > = values in this interval not displayed.     Hematology Recent Labs  Lab 09/24/20 1622 09/25/20 0427 09/26/20 0057  WBC 10.8* 7.6 9.2  RBC 3.09* 2.70* 2.47*  HGB 10.4* 9.0* 8.4*  HCT 30.4* 26.8* 24.7*  MCV 98.4 99.3 100.0  MCH 33.7 33.3 34.0  MCHC 34.2 33.6 34.0  RDW 13.5 13.7 14.0  PLT 184 163 169    BNPNo results for input(s): BNP, PROBNP in the last 168 hours.   DDimer No results for input(s): DDIMER in the last 168 hours.   Radiology    DG Chest Port 1 View  Result Date: 09/25/2020 CLINICAL DATA:  Shortness of breath EXAM: PORTABLE CHEST 1 VIEW COMPARISON:  September 24, 2020 FINDINGS: There is mild left base atelectasis. The lungs elsewhere are clear. The heart size and pulmonary vascularity are normal. No adenopathy. No bone lesions. IMPRESSION: Left base atelectasis.  Lungs elsewhere clear.  Heart size normal. Electronically Signed   By: September 26, 2020 III M.D.   On: 09/25/2020 07:55   09/27/2020 EKG SITE RITE  Result Date: 09/26/2020 If Site Rite image not attached,  placement could not be confirmed due to current cardiac rhythm.   Cardiac Studies   Echocardiogram 09/20/2020 LVEF 55 to 60%, RV size and function normal, normal RVSP, no valvular heart disease.  Patient Profile     75 y.o. male with a history of DM-2, HTN and HLD as well as longstanding tobacco abuse now with significant PAD status post aortobifem bypass with left femoral below-knee pop bypass who now has had an episode of prolonged chest pain and elevated troponin concerning for non-STEMI  Assessment & Plan     Non-STEMI (troponin up from 2050 up to 2219 now back down to 2050) flat trend, but symptoms sound concerning for ACS, cannot exclude -> volume related demand ischemia from diastolic dysfunction.  Recommend IV heparin, cycle troponins  As long as blood pressure stable is no longer having chest pain, can hold off on IV nitro.  Also need to consider the fact that he is now somewhat anemic.  Monitor closely while he is on heparin.  Could be demand ischemia from anemia.  Plan cardiac catheterization tomorrow for definitive evaluation.  Shared Decision Making/Informed Consent The risks [stroke (1 in 1000), death (1 in 1000), kidney failure [usually temporary] (1 in 500), bleeding (1 in 200), allergic reaction [possibly serious] (1 in 200)], benefits (diagnostic support and management of coronary artery disease) and alternatives of a cardiac catheterization with possible percutaneous coronary intervention were discussed in detail with Mark Dickerson and he is willing to proceed.  PAD with gangrene-per vascular surgery.  Volume overload: Postop he is 8 L positive  -> we will continue gentle diuresis.  We can check LVEDP in the Cath Lab to determine filling pressures.  Randa Lynn pressure seems somewhat labile, but pretty well controlled. ->  For now hold off on nitro drip, but if pain recurs, would start. -We will start low-dose beta-blocker.>  HLD: On statin, LDL was most recently  76.  DM-2: On sliding scale insulin  We will follow along develop for assistance.  I have scheduled him for catheterization tomorrow.     For questions or updates, please contact [facility] Please consult [email or web address] for contact info under        Signed, JGG:[email or web address], MD  09/26/2020, 7:15 PM

## 2020-09-26 NOTE — Progress Notes (Signed)
Physical Therapy Treatment Patient Details Name: Mark Dickerson MRN: [id number] DOB: [birth year] Today's Date: 09/26/2020    History of Present Illness Pt is 75 yo presented to vascular office 3/8 due to left foot gangrene. Pt admitted 3/9 for aortogram with extensive vascular sx 3/14 including aortobifem bPG, left fem pop BPG, endarterectomy of aorta and bil common femoral arteries. PMhx: DM, HTN, HLD    PT Comments    Pt was able to increase gait distance today without dizziness and VSS.  He did fatigue easily and did not want to attempt second bout of ambulation.  Required cues for safety and use of RW.  Continue to progress as able.     Follow Up Recommendations  Home health PT;Supervision for mobility/OOB     Equipment Recommendations  Rolling walker with 5" wheels;3in1 (PT)    Recommendations for Other Services       Precautions / Restrictions Precautions Precautions: Fall Precaution Comments: watch BP (dizzy on eval), abdominal incision Restrictions Weight Bearing Restrictions: No    Mobility  Bed Mobility Overal bed mobility: Needs Assistance             General bed mobility comments: Pt up in recliner upon my arrival    Transfers Overall transfer level: Needs assistance   Transfers: Sit to/from Stand Sit to Stand: Min assist         General transfer comment: Performed x 2 from chair and x 1 from bed, min A to rise, cues to use hands to control descent for pain control and safety  Ambulation/Gait Ambulation/Gait assistance: Min guard Gait Distance (Feet): 40 Feet Assistive device: Rolling walker (2 wheeled) Gait Pattern/deviations: Step-through pattern;Decreased stride length Gait velocity: decreased   General Gait Details: mild unsteadiness, and cues for posture and RW proximity, no dizziness and VSS.  Ambulated to door and IV pole too tall to go out - returned to chair to fix and then try walking again but pt reports fatigued and did not want to walk in  hall.  Did ambulate to bed and back to chair Audiological scientist    Modified Rankin (Stroke Patients Only)       Balance Overall balance assessment: Needs assistance Sitting-balance support: Feet supported Sitting balance-Leahy Scale: Good     Standing balance support: Bilateral upper extremity supported Standing balance-Leahy Scale: Fair Standing balance comment: RW for ambulation, static stand with close supervision without AD                            Cognition Arousal/Alertness: Awake/alert Behavior During Therapy: WFL for tasks assessed/performed Overall Cognitive Status: Within Functional Limits for tasks assessed                                        Exercises General Exercises - Upper Extremity Shoulder Flexion: AROM;Both;10 reps Shoulder Extension: AROM;Both;10 reps Elbow Flexion: AROM;Both;10 reps Elbow Extension: AROM;Both;10 reps Wrist Flexion: AROM;15 reps;Both Wrist Extension: AROM;15 reps;Both Digit Composite Flexion: AROM;Both;15 reps Composite Extension: AROM;Both;15 reps Chair Push Up: 5 reps;Seated    General Comments General comments (skin integrity, edema, etc.): VSS on RA      Pertinent Vitals/Pain Pain Assessment: Faces Faces Pain Scale: Hurts even more Pain Location: abdominal incision - with sit to stand Pain Descriptors /  Indicators: Tender;Grimacing Pain Intervention(s): Limited activity within patient's tolerance;Monitored during session;Repositioned    Home Living                      Prior Function            PT Goals (current goals can now be found in the care plan section) Acute Rehab PT Goals Patient Stated Goal: return home at d/c PT Goal Formulation: With patient Time For Goal Achievement: 10/09/20 Potential to Achieve Goals: Good Progress towards PT goals: Progressing toward goals    Frequency    Min 3X/week      PT Plan Current plan  remains appropriate    Co-evaluation              AM-PAC PT "6 Clicks" Mobility   Outcome Measure  Help needed turning from your back to your side while in a flat bed without using bedrails?: A Little Help needed moving from lying on your back to sitting on the side of a flat bed without using bedrails?: A Little Help needed moving to and from a bed to a chair (including a wheelchair)?: A Little Help needed standing up from a chair using your arms (e.g., wheelchair or bedside chair)?: A Little Help needed to walk in hospital room?: A Little Help needed climbing 3-5 steps with a railing? : A Lot 6 Click Score: 17    End of Session Equipment Utilized During Treatment: Gait belt Activity Tolerance: Patient tolerated treatment well Patient left: in chair;with nursing/sitter in room;with call bell/phone within reach Nurse Communication: Mobility status PT Visit Diagnosis: Other abnormalities of gait and mobility (R26.89);Difficulty in walking, not elsewhere classified (R26.2);Muscle weakness (generalized) (M62.81)     Time: 6568-1275 PT Time Calculation (min) (ACUTE ONLY): 21 min  Charges:  $Gait Training: 8-22 mins                     Anise Salvo, PT Acute Rehab Services Pager [phone] [facility] [phone]     Rayetta Humphrey 09/26/2020, 11:52 AM

## 2020-09-26 NOTE — Progress Notes (Signed)
ANTICOAGULATION CONSULT NOTE  Pharmacy Consult for heparin Indication: chest pain/ACS  Heparin Dosing Weight: 83.9 kg  Labs: Recent Labs    09/24/20 0158 09/24/20 0755 09/24/20 1622 09/25/20 0427 09/26/20 0057  HGB 12.9*   < > 10.4* 9.0* 8.4*  HCT 37.0*   < > 30.4* 26.8* 24.7*  PLT 221  --  184 163 169  APTT  --   --  31  --   --   LABPROT 13.4  --  15.5*  --   --   INR 1.1  --  1.3*  --   --   CREATININE 1.18   < > 1.09 1.07 1.24   < > = values in this interval not displayed.    Assessment: 78 yom presenting with atherosclerosis of native LLE with gangrene, now s/p aortobifemoral bypass and L fem below knee popliteal artery bypass on 3/14. Patient now with elevated troponin. Pharmacy consulted to dose heparin with no bolus requested per MD. Patient is not on anticoagulation PTA. Hg trending down to 8.4, plt wnl. No active bleed issues reported.  Goal of Therapy:  Heparin level 0.3-0.7 units/ml Monitor platelets by anticoagulation protocol: Yes   Plan:  No bolus. Start heparin at 1000 units/hr Check 8hr heparin level Monitor daily CBC, s/sx bleeding   Leia Alf, PharmD, BCPS Clinical Pharmacist 09/26/2020 3:58 PM

## 2020-09-27 ENCOUNTER — Encounter (HOSPITAL_COMMUNITY)
Admission: RE | Disposition: A | Discharge status: Home / Self Care | Payer: Self-pay | Source: Home / Self Care | Attending: Vascular Surgery

## 2020-09-27 LAB — TYPE AND SCREEN
ABO/RH(D): O POS
Antibody Screen: NEGATIVE
Unit division: 0
Unit division: 0

## 2020-09-27 LAB — BASIC METABOLIC PANEL
Anion gap: 9 (ref 5–15)
BUN: 13 mg/dL (ref 8–23)
CO2: 22 mmol/L (ref 22–32)
Calcium: 8.6 mg/dL — ABNORMAL LOW (ref 8.9–10.3)
Chloride: 106 mmol/L (ref 98–111)
Creatinine, Ser: 1.17 mg/dL (ref 0.61–1.24)
GFR, Estimated: >60 mL/min (ref >60)
Glucose, Bld: 116 mg/dL — ABNORMAL HIGH (ref 70–99)
Potassium: 3.6 mmol/L (ref 3.5–5.1)
Sodium: 137 mmol/L (ref 135–145)

## 2020-09-27 LAB — CBC
HCT: 20.9 % — ABNORMAL LOW (ref 39.0–52.0)
HCT: 27.9 % — ABNORMAL LOW (ref 39.0–52.0)
Hemoglobin: 7.1 g/dL — ABNORMAL LOW (ref 13.0–17.0)
Hemoglobin: 9.4 g/dL — ABNORMAL LOW (ref 13.0–17.0)
MCH: 34.5 pg — ABNORMAL HIGH (ref 26.0–34.0)
MCH: 30.8 pg (ref 26.0–34.0)
MCHC: 34 g/dL (ref 30.0–36.0)
MCHC: 33.7 g/dL (ref 30.0–36.0)
MCV: 101.5 fL — ABNORMAL HIGH (ref 80.0–100.0)
MCV: 91.5 fL (ref 80.0–100.0)
Platelets: 161 10*3/uL (ref 150–400)
Platelets: 158 10*3/uL (ref 150–400)
RBC: 2.06 MIL/uL — ABNORMAL LOW (ref 4.22–5.81)
RBC: 3.05 MIL/uL — ABNORMAL LOW (ref 4.22–5.81)
RDW: 14.1 % (ref 11.5–15.5)
RDW: 19.2 % — ABNORMAL HIGH (ref 11.5–15.5)
WBC: 8.7 10*3/uL (ref 4.0–10.5)
WBC: 8.8 10*3/uL (ref 4.0–10.5)
nRBC: 0 % (ref 0.0–0.2)
nRBC: 0.2 % (ref 0.0–0.2)

## 2020-09-27 LAB — HEPARIN LEVEL (UNFRACTIONATED)
Heparin Unfractionated: 0.29 IU/mL — ABNORMAL LOW (ref 0.30–0.70)
Heparin Unfractionated: <0.1 IU/mL — ABNORMAL LOW (ref 0.30–0.70)

## 2020-09-27 LAB — PREPARE RBC (CROSSMATCH)

## 2020-09-27 LAB — TROPONIN I (HIGH SENSITIVITY): Troponin I (High Sensitivity): 1992 ng/L (ref <18)

## 2020-09-27 LAB — GLUCOSE, CAPILLARY
Glucose-Capillary: 115 mg/dL — ABNORMAL HIGH (ref 70–99)
Glucose-Capillary: 120 mg/dL — ABNORMAL HIGH (ref 70–99)
Glucose-Capillary: 144 mg/dL — ABNORMAL HIGH (ref 70–99)
Glucose-Capillary: 129 mg/dL — ABNORMAL HIGH (ref 70–99)
Glucose-Capillary: 128 mg/dL — ABNORMAL HIGH (ref 70–99)

## 2020-09-27 LAB — BPAM RBC
Blood Product Expiration Date: 202204142359
Blood Product Expiration Date: 202204142359
Unit Type and Rh: 5100
Unit Type and Rh: 5100

## 2020-09-27 SURGERY — LEFT HEART CATH AND CORONARY ANGIOGRAPHY
Anesthesia: LOCAL

## 2020-09-27 MED ORDER — OMEGA-3-ACID ETHYL ESTERS 1 G PO CAPS
2.0000 g | ORAL_CAPSULE | Freq: Two times a day (BID) | ORAL | Status: DC
  Administered 2020-09-27 – 2020-09-28 (×3): 2 g via ORAL
  Filled 2020-09-27 (×3): qty 2

## 2020-09-27 MED ORDER — FUROSEMIDE 10 MG/ML IJ SOLN
60.0000 mg | Freq: Once | INTRAMUSCULAR | Status: AC
  Administered 2020-09-27: 60 mg via INTRAVENOUS
  Filled 2020-09-27: qty 6

## 2020-09-27 MED ORDER — ASPIRIN 81 MG PO CHEW: 81.0000 mg | CHEWABLE_TABLET | ORAL | Status: AC

## 2020-09-27 MED ORDER — ATORVASTATIN CALCIUM 40 MG PO TABS
40.0000 mg | ORAL_TABLET | Freq: Every day | ORAL | Status: DC
  Administered 2020-09-27 – 2020-09-28 (×2): 40 mg via ORAL
  Filled 2020-09-27 (×2): qty 1

## 2020-09-27 MED ORDER — SODIUM CHLORIDE 0.9 % IV SOLN: INTRAVENOUS | Status: DC

## 2020-09-27 MED ORDER — POTASSIUM CHLORIDE 10 MEQ/100ML IV SOLN
10.0000 meq | INTRAVENOUS | Status: DC
  Administered 2020-09-27: 10 meq via INTRAVENOUS
  Filled 2020-09-27: qty 100

## 2020-09-27 MED ORDER — METOPROLOL TARTRATE 25 MG PO TABS
25.0000 mg | ORAL_TABLET | Freq: Two times a day (BID) | ORAL | Status: DC
  Administered 2020-09-27 – 2020-09-28 (×3): 25 mg via ORAL
  Filled 2020-09-27 (×3): qty 1

## 2020-09-27 MED ORDER — SODIUM CHLORIDE 0.9% FLUSH
3.0000 mL | Freq: Two times a day (BID) | INTRAVENOUS | Status: DC
  Administered 2020-09-27 (×2): 3 mL via INTRAVENOUS

## 2020-09-27 MED ORDER — PNEUMOCOCCAL VAC POLYVALENT 25 MCG/0.5ML IJ INJ: 0.5000 mL | INJECTION | INTRAMUSCULAR | Status: DC

## 2020-09-27 MED ORDER — BISACODYL 10 MG RE SUPP: 10.0000 mg | Freq: Once | RECTAL | Status: DC

## 2020-09-27 MED ORDER — POTASSIUM CHLORIDE CRYS ER 20 MEQ PO TBCR
40.0000 meq | EXTENDED_RELEASE_TABLET | Freq: Three times a day (TID) | ORAL | Status: AC
  Administered 2020-09-27 (×3): 40 meq via ORAL
  Filled 2020-09-27 (×3): qty 2

## 2020-09-27 MED ORDER — FUROSEMIDE 10 MG/ML IJ SOLN
40.0000 mg | Freq: Once | INTRAMUSCULAR | Status: AC
  Administered 2020-09-27: 40 mg via INTRAVENOUS
  Filled 2020-09-27: qty 4

## 2020-09-27 MED ORDER — SODIUM CHLORIDE 0.9% FLUSH: 3.0000 mL | INTRAVENOUS | Status: DC | PRN

## 2020-09-27 MED ORDER — BISACODYL 5 MG PO TBEC
5.0000 mg | DELAYED_RELEASE_TABLET | Freq: Every day | ORAL | Status: DC | PRN
  Administered 2020-09-28: 5 mg via ORAL
  Filled 2020-09-27: qty 1

## 2020-09-27 MED ORDER — ASPIRIN 81 MG PO CHEW: 81.0000 mg | CHEWABLE_TABLET | ORAL | Status: DC

## 2020-09-27 MED ORDER — SODIUM CHLORIDE 0.9 % IV SOLN: 250.0000 mL | INTRAVENOUS | Status: DC | PRN

## 2020-09-27 NOTE — Progress Notes (Signed)
Progress Note  Patient Name: Mark Dickerson Date of Encounter: 09/27/2020  [facility] Cardiologist: No primary care provider on file. Dr. Tresa Endo  We have been reconsulted postoperatively by Dr.Hawken for new onset chest pain with elevated troponin, and volume overload.  Subjective   Very anxious today.  No more chest pain.  Seems very overwhelmed about the big surgery he just had and is not interested in more procedures.  Has now decided he does not want cath. Says his breathing is fine although he looks uncomfortable.  Inpatient Medications    Scheduled Meds: . acetaminophen  650 mg Oral Q6H  . aspirin  81 mg Oral Pre-Cath  . Chlorhexidine Gluconate Cloth  6 each Topical Daily  . docusate sodium  100 mg Oral Daily  . furosemide  40 mg Intravenous Once  . insulin aspart  0-15 Units Subcutaneous TID WC  . metoprolol tartrate  25 mg Oral BID  . pantoprazole  40 mg Oral Daily  . potassium chloride  40 mEq Oral TID  . sodium chloride flush  10-40 mL Intracatheter Q12H  . sodium chloride flush  3 mL Intravenous Q12H   Continuous Infusions: . sodium chloride    . sodium chloride Stopped (09/27/20 0814)  . heparin 1,100 Units/hr (09/27/20 0800)   PRN Meds: sodium chloride, alum & mag hydroxide-simeth, bisacodyl, guaiFENesin-dextromethorphan, hydrALAZINE, labetalol, metoprolol tartrate, morphine injection, ondansetron, oxyCODONE, oxyCODONE-acetaminophen, phenol, potassium chloride, sodium chloride flush, sodium chloride flush   Vital Signs    Vitals:   09/27/20 0502 09/27/20 0600 09/27/20 0800 09/27/20 0808  BP: (!) 144/72 (!) 141/70 (!) 153/66   Pulse: 85 83 92   Resp: 14 14 18    Temp:    97.8 F (36.6 C)  TempSrc:    Oral  SpO2: 96% 98% 96%   Weight:      Height:        Intake/Output Summary (Last 24 hours) at 09/27/2020 0847 Last data filed at 09/27/2020 0800 Gross per 24 hour  Intake 471.61 ml  Output 2400 ml  Net -1928.39 ml   Last 3 Weights 09/27/2020 09/26/2020  09/25/2020  Weight (lbs) 197 lb 12 oz 201 lb 4.5 oz 184 lb 15.5 oz  Weight (kg) 89.7 kg 91.3 kg 83.9 kg      Telemetry    NSR 93 LBBB- Personally Reviewed  ECG    NSR rate 98, LBBB- Personally Reviewed  Physical Exam   GEN: Anxious, but stable.  S sitting up in bed, no obvious distress. Neck:  JVP roughly 8 cm water.  Soft bruit. Cardiac:  RRR, normal S1-S2.  No abscess R/G. Respiratory:  Mildly decreased breath sounds at the bases otherwise CTA B.  Nonlabored. GI: Soft/NT/ND says any BS.  No HSM. MS:  Puffy mild left hand swelling, but no real edema.  Diminished pedal pulses, feet are warm.   Neuro:   Nonfocal, but agitated.  Awake and oriented x3 Psych: Somewhat stressed affect; very anxious  Labs    High Sensitivity Troponin:   Recent Labs  Lab 09/26/20 1507 09/26/20 1634 09/26/20 1949 09/26/20 2204 09/27/20 0246  TROPONINIHS 2,219* 2,056* 1,860* 1,913* 1,992*      Chemistry Recent Labs  Lab 09/25/20 0427 09/26/20 0057 09/27/20 0246  NA 136 138 137  K 4.2 4.2 3.6  CL 109 111 106  CO2 19* 19* 22  GLUCOSE 147* 125* 116*  BUN 11 15 13   CREATININE 1.07 1.24 1.17  CALCIUM 7.9* 8.1* 8.6*  PROT 4.8*  --   --  ALBUMIN 2.8*  --   --   AST 19  --   --   ALT 13  --   --   ALKPHOS 29*  --   --   BILITOT 0.7  --   --   GFRNONAA >60 >60 >60  ANIONGAP 8 8 9      Hematology Recent Labs  Lab 09/25/20 0427 09/26/20 0057 09/27/20 0246  WBC 7.6 9.2 8.7  RBC 2.70* 2.47* 2.06*  HGB 9.0* 8.4* 7.1*  HCT 26.8* 24.7* 20.9*  MCV 99.3 100.0 101.5*  MCH 33.3 34.0 34.5*  MCHC 33.6 34.0 34.0  RDW 13.7 14.0 14.1  PLT 163 169 161    BNPNo results for input(s): BNP, PROBNP in the last 168 hours.   DDimer No results for input(s): DDIMER in the last 168 hours.   Radiology    09/29/20 EKG SITE RITE  Result Date: 09/26/2020 If Site Rite image not attached, placement could not be confirmed due to current cardiac rhythm.   Cardiac Studies   Echocardiogram 09/20/2020 LVEF  55 to 60%, RV size and function normal, normal RVSP, no valvular heart disease.  Patient Profile     75 y.o. male with a history of DM-2, HTN and HLD as well as longstanding tobacco abuse now with significant PAD status post aortobifem bypass with left femoral below-knee pop bypass who now has had an episode of prolonged chest pain and elevated troponin concerning for non-STEMI  Assessment & Plan     Non-STEMI (troponin up from 2050 up to 2219 now back down to 2050) flat trend, but symptoms sound concerning for ACS, cannot exclude -> volume related demand ischemia from diastolic dysfunction.  Troponin levels actually were flat.  This would not easily be consistent with ACS, however the symptoms are more consistent with ACS. => Would like to try to do 48 hours of IV heparin, but with drop in hemoglobin will defer to vascular surgery.  Provided he has no further chest pain, will hold off on IV nitroglycerin, but will increase Toprol to 25 mg twice daily.  Restart statin  Transfuse at least 2 units with diuretic before and after.  Initial plan for cardiac catheterization will be postponed.  He currently is now indicating that he will does not want to have heart catheterization procedure done.  I think he is just overwhelmed by the nature of his surgery and how much it takes to overcome.  He feels like he does not want more procedures at this time.  With that in mind, we will continue medical management which would include completing the course of heparin, restarting statin and titrating up beta-blocker.  Potential Imdur on discharge.  PAD with gangrene-per vascular surgery.  Volume overload: Postop he is 8 L positive  -> we will continue gentle diuresis.  We can check LVEDP in the Cath Lab to determine filling pressures.  HTN: BP is optimal today, but apparently has some drops.  Will titrate up beta-blocker.  HLD: On statin at home, LDL relatively controlled was 76.  We will restart  statin.,  DM-2: On SSI   For now plan will be medical management Of his ACS.  We can consider outpatient ischemic evaluation once he is recovered from his surgery.  However, if he has more recurrent chest discomfort or dyspnea on exertion we would have more impetus to reconsider the possibility of cath.     For questions or updates, please contact [facility] Please consult [email or web address] for contact info under  Signed, Bryan Lemma, MD  09/27/2020, 8:47 AM

## 2020-09-27 NOTE — Progress Notes (Signed)
ANTICOAGULATION CONSULT NOTE  Pharmacy Consult for heparin Indication: chest pain/ACS  Heparin Dosing Weight: 83.9 kg  Labs: Recent Labs    09/24/20 1622 09/25/20 0427 09/26/20 0057 09/26/20 1830 09/27/20 0246  HGB 10.4* 9.0* 8.4*  --  7.1*  HCT 30.4* 26.8* 24.7*  --  20.9*  PLT 184 163 169  --  161  APTT 31  --   --   --   --   LABPROT 15.5*  --   --   --   --   INR 1.3*  --   --   --   --   HEPARINUNFRC  --   --   --  <0.10* 0.29*  CREATININE 1.09 1.07 1.24  --   --     Assessment: 82 yom presenting with atherosclerosis of native LLE with gangrene, now s/p aortobifemoral bypass and L fem below knee popliteal artery bypass on 3/14. Patient now with elevated troponin. Pharmacy consulted to dose heparin with no bolus requested per MD. Patient is not on anticoagulation PTA. Hg trending down to 8.4, plt wnl. No active bleed issues reported.  Heparin level slightly subtherapeutic (0.29) on gtt at 1000 units/hr. No issues with line or bleeding reported per RN. Plan for cath today.  Goal of Therapy:  Heparin level 0.3-0.7 units/ml Monitor platelets by anticoagulation protocol: Yes   Plan:  Increase heparin to 1100 units/hr F/u post cath  Christoper Fabian, PharmD, BCPS Please see amion for complete clinical pharmacist phone list 09/27/2020 4:14 AM

## 2020-09-27 NOTE — Progress Notes (Signed)
ANTICOAGULATION CONSULT NOTE  Pharmacy Consult for heparin Indication: chest pain/ACS  Heparin Dosing Weight: 83.9 kg  Labs: Recent Labs    09/25/20 0427 09/26/20 0057 09/26/20 1830 09/27/20 0246 09/27/20 1651  HGB 9.0* 8.4*  --  7.1* 9.4*  HCT 26.8* 24.7*  --  20.9* 27.9*  PLT 163 169  --  161 158  HEPARINUNFRC  --   --  <0.10* 0.29* <0.10*  CREATININE 1.07 1.24  --  1.17  --     Assessment: 75 yom presenting with atherosclerosis of native LLE with gangrene, now s/p aortobifemoral bypass and L fem below knee popliteal artery bypass on 3/14. Patient now with elevated troponin. Pharmacy consulted to dose heparin with no bolus requested per MD. Patient is not on anticoagulation PTA.  Heparin level resulted undetectable this afternoon after previously just under therapeutic level at 0.29 this AM. No issues with line or bleeding reported per RN on 4E and previous floor 2H and drip was not off at any point today. Patient did receive 2u PRBC earlier today - Hg improved from 7.1 to 9.4. Plt WNL. Patient now refusing cath. Planning 48hrs of heparin per Cards.  Goal of Therapy:  Heparin level 0.3-0.7 units/ml Monitor platelets by anticoagulation protocol: Yes   Plan:  Increase heparin to 1250 units/hr - conservative adjustment with recent PRBC transfusion Check 8hr heparin level Monitor daily CBC, s/sx bleeding Stop date entered for 48hrs of treatment per Dr. Elissa Hefty note (end time 3/18 at 1700)   Leia Alf, PharmD, BCPS Please check AMION for all [facility] contact numbers Clinical Pharmacist 09/27/2020 6:06 PM

## 2020-09-27 NOTE — Progress Notes (Addendum)
Aortic Surgery Progress Note    09/27/2020 6:54 AM 3 Days Post-Op  Subjective:  Says his chest discomfort only lasted a few seconds and is better.  Does not want heart cath today.  Says he has passed a lot of gas but no BM.  Walked in the halls yesterday.    Tm 99 now afebrile HR 70's-110's NSR 110's-160's systolic 96% RA  Gtts:  none  Vitals:   09/27/20 0502 09/27/20 0600  BP: (!) 144/72 (!) 141/70  Pulse: 85 83  Resp: 14 14  Temp:    SpO2: 96% 98%    Physical Exam: Cardiac:  regular Lungs:  Non labored on RA Abdomen:  Soft, NT/ND; +flatus Incisions:   Laparotomy incision is clean and healing; left groin and left below knee incisions are clean and dry and healing.  Left thigh incision looks good but there is moderate serous drainage.   Extremities:  Brisk right peroneal doppler signal and monophasic right PT doppler signal.  Left brisk DP/PT doppler signals.   General:  Awake in bed-no distress  CBC    Component Value Date/Time   WBC 8.7 09/27/2020 0246   RBC 2.06 (L) 09/27/2020 0246   HGB 7.1 (L) 09/27/2020 0246   HCT 20.9 (L) 09/27/2020 0246   PLT 161 09/27/2020 0246   MCV 101.5 (H) 09/27/2020 0246   MCH 34.5 (H) 09/27/2020 0246   MCHC 34.0 09/27/2020 0246   RDW 14.1 09/27/2020 0246    BMET    Component Value Date/Time   NA 137 09/27/2020 0246   K 3.6 09/27/2020 0246   CL 106 09/27/2020 0246   CO2 22 09/27/2020 0246   GLUCOSE 116 (H) 09/27/2020 0246   BUN 13 09/27/2020 0246   CREATININE 1.17 09/27/2020 0246   CALCIUM 8.6 (L) 09/27/2020 0246   GFRNONAA >60 09/27/2020 0246    INR    Component Value Date/Time   INR 1.3 (H) 09/24/2020 1622     Intake/Output Summary (Last 24 hours) at 09/27/2020 0654 Last data filed at 09/27/2020 0600 Gross per 24 hour  Intake 586.36 ml  Output 2400 ml  Net -1813.64 ml     Assessment/Plan:  75 y.o. male is s/p  Aortobifemoral bypass and left fembelow knee popliteal artery bypass with non-reversed greater  saphenous vein in anatomic tunnel 3 Days Post-Op  -Vascular:   Brisk right peroneal doppler signal and monophasic right PT doppler signal.  Left brisk DP/PT doppler signals.   Needs to keep left heel floated due to heel ulcer -Cardiac:  Elevated troponin levels-they had trended downward but started trending back upward last evening and pt started on heparin gtt yesterday.  Pt for cardiac cath today but does not want to undergo this as he felt this was gas.  Explained that his blood work shows otherwise.  Will defer to cardiology.  -Pulmonary:  Non labored on RA; encouraged pt to continue using IS every hour.   -Neuro:  In tact -Renal:  Renal function improved from yesterday with good UOP.   -GI:  Abdomen is soft and mild tenderness RLQ.  Pt is passing flatus but no BM.  Will give supp today. -Incisions:  Laparotomy incision is clean and healing; left groin and left below knee incisions are clean and dry and healing.  Left thigh incision looks good but there is moderate serous drainage.   Keep left thigh incision bandaged as needed for drainage.  No purulence and no evidence of infection.   -Heme/ID:  Acute blood  loss anemia with hgb 7.1 down from 8.4-will transfuse two units today and diurese.   -General:  No distress.  Pt walked yesterday-continue to mobilize.     Doreatha Massed, PA-C [facility] [phone] 09/27/2020 6:54 AM  VASCULAR STAFF ADDENDUM: I have independently interviewed and examined the patient. I agree with the above.  Agree with above. NSTEMI yesterday. He prefers to wait for cardiac catheterization. Will treat him medically for now. Continue heparin for ACS. Continue ASA/Statin. Anemia - likely operative losses + dilution from IVF. Transfuse 2u PRBC. Volume overload. Continue diuresis with Lasix. OK for regular food. Transfer to 4E.  Rande Brunt. Lenell Antu, MD [facility] Phone Number: [phone] 09/27/2020  10:13 AM

## 2020-09-28 ENCOUNTER — Ambulatory Visit: Payer: Medicare HMO | Admitting: Sports Medicine

## 2020-09-28 ENCOUNTER — Telehealth: Payer: Self-pay

## 2020-09-28 DIAGNOSIS — I70262 Atherosclerosis of native arteries of extremities with gangrene, left leg: Secondary | ICD-10-CM | POA: Diagnosis not present

## 2020-09-28 LAB — BPAM RBC
Blood Product Expiration Date: 202204142359
Blood Product Expiration Date: 202204142359
ISSUE DATE / TIME: 202203170923
ISSUE DATE / TIME: 202203171200
Unit Type and Rh: 5100
Unit Type and Rh: 5100

## 2020-09-28 LAB — BASIC METABOLIC PANEL
Anion gap: 10 (ref 5–15)
BUN: 16 mg/dL (ref 8–23)
CO2: 23 mmol/L (ref 22–32)
Calcium: 8.6 mg/dL — ABNORMAL LOW (ref 8.9–10.3)
Chloride: 102 mmol/L (ref 98–111)
Creatinine, Ser: 1.13 mg/dL (ref 0.61–1.24)
GFR, Estimated: >60 mL/min (ref >60)
Glucose, Bld: 123 mg/dL — ABNORMAL HIGH (ref 70–99)
Potassium: 3.9 mmol/L (ref 3.5–5.1)
Sodium: 135 mmol/L (ref 135–145)

## 2020-09-28 LAB — TYPE AND SCREEN
ABO/RH(D): O POS
Antibody Screen: NEGATIVE
Unit division: 0
Unit division: 0

## 2020-09-28 LAB — GLUCOSE, CAPILLARY
Glucose-Capillary: 133 mg/dL — ABNORMAL HIGH (ref 70–99)
Glucose-Capillary: 142 mg/dL — ABNORMAL HIGH (ref 70–99)
Glucose-Capillary: 146 mg/dL — ABNORMAL HIGH (ref 70–99)

## 2020-09-28 LAB — CBC
HCT: 28.4 % — ABNORMAL LOW (ref 39.0–52.0)
Hemoglobin: 9.4 g/dL — ABNORMAL LOW (ref 13.0–17.0)
MCH: 30.2 pg (ref 26.0–34.0)
MCHC: 33.1 g/dL (ref 30.0–36.0)
MCV: 91.3 fL (ref 80.0–100.0)
Platelets: 176 10*3/uL (ref 150–400)
RBC: 3.11 MIL/uL — ABNORMAL LOW (ref 4.22–5.81)
RDW: 19.7 % — ABNORMAL HIGH (ref 11.5–15.5)
WBC: 8.7 10*3/uL (ref 4.0–10.5)
nRBC: 0 % (ref 0.0–0.2)

## 2020-09-28 LAB — HEPARIN LEVEL (UNFRACTIONATED): Heparin Unfractionated: 0.67 IU/mL (ref 0.30–0.70)

## 2020-09-28 MED ORDER — NITROGLYCERIN 0.3 MG SL SUBL: 0.3000 mg | SUBLINGUAL_TABLET | SUBLINGUAL | 12 refills | Status: DC | PRN

## 2020-09-28 MED ORDER — METOPROLOL TARTRATE 50 MG PO TABS: 50.0000 mg | ORAL_TABLET | Freq: Two times a day (BID) | ORAL | 2 refills | Status: DC

## 2020-09-28 MED ORDER — ISOSORBIDE MONONITRATE ER 30 MG PO TB24: 30.0000 mg | ORAL_TABLET | Freq: Every day | ORAL | Status: DC

## 2020-09-28 MED ORDER — NITROGLYCERIN 0.3 MG SL SUBL
0.3000 mg | SUBLINGUAL_TABLET | SUBLINGUAL | Status: DC | PRN
  Filled 2020-09-28: qty 100

## 2020-09-28 MED ORDER — METOPROLOL TARTRATE 50 MG PO TABS: 50.0000 mg | ORAL_TABLET | Freq: Two times a day (BID) | ORAL | Status: DC

## 2020-09-28 MED ORDER — NITROGLYCERIN 0.4 MG SL SUBL: 0.4000 mg | SUBLINGUAL_TABLET | SUBLINGUAL | Status: DC | PRN

## 2020-09-28 MED ORDER — ISOSORBIDE MONONITRATE ER 30 MG PO TB24: 30.0000 mg | ORAL_TABLET | Freq: Every day | ORAL | 1 refills | Status: DC

## 2020-09-28 MED ORDER — CLOPIDOGREL BISULFATE 75 MG PO TABS: 75.0000 mg | ORAL_TABLET | Freq: Every day | ORAL | 11 refills | Status: DC

## 2020-09-28 MED ORDER — BENAZEPRIL HCL 20 MG PO TABS: 20.0000 mg | ORAL_TABLET | Freq: Every day | ORAL | 1 refills | Status: DC

## 2020-09-28 MED ORDER — CLOPIDOGREL BISULFATE 75 MG PO TABS
300.0000 mg | ORAL_TABLET | Freq: Every day | ORAL | Status: DC
  Administered 2020-09-28: 300 mg via ORAL
  Filled 2020-09-28: qty 4

## 2020-09-28 MED ORDER — BENAZEPRIL HCL 5 MG PO TABS
20.0000 mg | ORAL_TABLET | Freq: Every day | ORAL | Status: DC
  Administered 2020-09-28: 20 mg via ORAL
  Filled 2020-09-28: qty 4

## 2020-09-28 MED ORDER — OXYCODONE-ACETAMINOPHEN 5-325 MG PO TABS: 1.0000 | ORAL_TABLET | ORAL | 0 refills | Status: DC | PRN

## 2020-09-28 MED ORDER — ASPIRIN EC 81 MG PO TBEC: 81.0000 mg | DELAYED_RELEASE_TABLET | Freq: Every day | ORAL | 2 refills | Status: DC

## 2020-09-28 MED ORDER — NITROGLYCERIN 0.4 MG SL SUBL: 0.4000 mg | SUBLINGUAL_TABLET | SUBLINGUAL | 12 refills | Status: DC | PRN

## 2020-09-28 NOTE — Progress Notes (Addendum)
   VASCULAR SURGERY ASSESSMENT & PLAN:   4 Days Post-Op 75 y.o. male is s/p  Aortobifemoral bypass and left fembelow knee popliteal artery bypass with non-reversed greater saphenous vein in anatomic tunnel. LLE well perfused. Vss. Afebrile. No BM yet.   Non-STEMI: cardiology following. Cardiac cath postponed for now; medical management continues  ABL anemia: status post 2 units PCs yesterday with good response  Dispo: HHPT SUBJECTIVE:   Denies CP, SOB, nausea. Tolerating diet. OOB to chair  PHYSICAL EXAM:   Vitals:   09/27/20 1800 09/27/20 1946 09/27/20 2248 09/28/20 0257  BP:  (!) 151/72 138/72 (!) 149/76  Pulse:  85 85 78  Resp: 17 16 17 15   Temp:  98.7 F (37.1 C) 98.3 F (36.8 C) 97.8 F (36.6 C)  TempSrc:  Oral Oral Oral  SpO2: 92% 93% 94% 94%  Weight:      Height:       Cardiac:  regular Lungs:  Non labored on RA Abdomen:  Soft, NT/ND; +flatus Incisions:   Laparotomy incision is clean and healing; left groin and left below knee incisions are clean and dry and healing.  Moderate left lower leg edema. Left toe ulcers are dry. Left thigh incision with minimal serous drainage.   Extremities:  .  Left brisk DP/PT/peroneal Doppler signals.   General:  Awake; in no distress  LABS:   Lab Results  Component Value Date   WBC 8.7 09/28/2020   HGB 9.4 (L) 09/28/2020   HCT 28.4 (L) 09/28/2020   MCV 91.3 09/28/2020   PLT 176 09/28/2020   Lab Results  Component Value Date   CREATININE 1.13 09/28/2020   Lab Results  Component Value Date   INR 1.3 (H) 09/24/2020   CBG (last 3)  Recent Labs    09/27/20 1658 09/27/20 2129 09/28/20 0613  GLUCAP 129* 128* 133*    PROBLEM LIST:    Active Problems:   Atherosclerosis of native artery of left lower extremity with gangrene (HCC)   CURRENT MEDS:   . acetaminophen  650 mg Oral Q6H  . atorvastatin  40 mg Oral Daily  . Chlorhexidine Gluconate Cloth  6 each Topical Daily  . docusate sodium  100 mg Oral Daily  .  insulin aspart  0-15 Units Subcutaneous TID WC  . metoprolol tartrate  25 mg Oral BID  . omega-3 acid ethyl esters  2 g Oral BID  . pantoprazole  40 mg Oral Daily  . pneumococcal 23 valent vaccine  0.5 mL Intramuscular Tomorrow-1000  . sodium chloride flush  10-40 mL Intracatheter Q12H  . sodium chloride flush  3 mL Intravenous Q12H    09/30/20, Milinda Antis Office: [phone] 09/28/2020   VASCULAR STAFF ADDENDUM: I have independently interviewed and examined the patient. I agree with the above.  Mobilize as able.  Continue medical management of NSTEMI. Wants to go home. I think he is ready. Hopefully can transition to DOAC. Will check with cardiology. Home if all agree this is safe.  09/30/2020. Rande Brunt, MD [facility] Phone Number: [phone] 09/28/2020 11:51 AM

## 2020-09-28 NOTE — Progress Notes (Signed)
Physical Therapy Treatment Patient Details Name: Mark Dickerson MRN: [id number] DOB: 1946-05-22 Today's Date: 09/28/2020    History of Present Illness Pt is 75 yo presented to vascular office 3/8 due to left foot gangrene. Pt admitted 3/9 for aortogram with extensive vascular sx 3/14 including aortobifem bPG, left fem pop BPG, endarterectomy of aorta and bil common femoral arteries. PMhx: DM, HTN, HLD    PT Comments    Progressing well toward goals, pt able to mobilize without assist.  He prefers use of the RW at this point, but able to ambulate without AD.  Emphasis on transfer safety and progressing gait stability.    Follow Up Recommendations  Home health PT;Supervision for mobility/OOB     Equipment Recommendations  Rolling walker with 5" wheels;3in1 (PT)    Recommendations for Other Services       Precautions / Restrictions Precautions Precautions: Fall    Mobility  Bed Mobility               General bed mobility comments: Pt up in recliner upon my arrival    Transfers Overall transfer level: Needs assistance   Transfers: Sit to/from Stand Sit to Stand: Supervision            Ambulation/Gait Ambulation/Gait assistance: Supervision Gait Distance (Feet): 375 Feet Assistive device: Rolling walker (2 wheeled);None Gait Pattern/deviations: Step-through pattern;Decreased stride length Gait velocity: decreased Gait velocity interpretation: <1.8 ft/sec, indicate of risk for recurrent falls General Gait Details: With RW, pt was generally steady with submaximal upright stance, able to scan without any overt deviation.  With AD, pt has a less steady, loping type gait, but no LOB.   Stairs             Wheelchair Mobility    Modified Rankin (Stroke Patients Only)       Balance Overall balance assessment: Needs assistance Sitting-balance support: Feet supported Sitting balance-Leahy Scale: Good     Standing balance support: No upper extremity  supported;Bilateral upper extremity supported;During functional activity Standing balance-Leahy Scale: Fair                              Cognition Arousal/Alertness: Awake/alert Behavior During Therapy: WFL for tasks assessed/performed Overall Cognitive Status: Within Functional Limits for tasks assessed                                        Exercises      General Comments General comments (skin integrity, edema, etc.): vss on RA with SpO2 in upper 90's, HR in the 90's      Pertinent Vitals/Pain Pain Assessment: Faces Faces Pain Scale: Hurts a little bit Pain Location: abdominal incision Pain Descriptors / Indicators: Sore Pain Intervention(s): Monitored during session    Home Living                      Prior Function            PT Goals (current goals can now be found in the care plan section) Acute Rehab PT Goals Patient Stated Goal: return home at d/c PT Goal Formulation: With patient Time For Goal Achievement: 10/09/20 Potential to Achieve Goals: Good Progress towards PT goals: Progressing toward goals    Frequency    Min 3X/week      PT Plan Current plan remains appropriate  Co-evaluation              AM-PAC PT "6 Clicks" Mobility   Outcome Measure  Help needed turning from your back to your side while in a flat bed without using bedrails?: A Little Help needed moving from lying on your back to sitting on the side of a flat bed without using bedrails?: A Little Help needed moving to and from a bed to a chair (including a wheelchair)?: A Little Help needed standing up from a chair using your arms (e.g., wheelchair or bedside chair)?: A Little Help needed to walk in hospital room?: A Little Help needed climbing 3-5 steps with a railing? : A Lot 6 Click Score: 17    End of Session   Activity Tolerance: Patient tolerated treatment well Patient left: in chair;with family/visitor present;with call  bell/phone within reach Nurse Communication: Mobility status PT Visit Diagnosis: Other abnormalities of gait and mobility (R26.89);Difficulty in walking, not elsewhere classified (R26.2)     Time: 0093-8182 PT Time Calculation (min) (ACUTE ONLY): 15 min  Charges:  $Gait Training: 8-22 mins                     09/28/2020  Jacinto Halim., PT Acute Rehabilitation Services [phone]  (pager) [phone]  (office)   Eliseo Gum Faduma Cho 09/28/2020, 1:34 PM

## 2020-09-28 NOTE — Progress Notes (Signed)
   09/28/20 1130  Clinical Encounter Type  Visited With Patient and family together  Visit Type Initial  Referral From Nurse  Consult/Referral To Chaplain   Chaplain responded to call from charge nurse regarding HCPOA. Pt's daughter was at bedside. Chaplain provided AD education and answered all of her questions. Pt appeared disengaged through most of the visit, however, Pt did state he wants his daughter to be HCPOA. Pt's daughter stated Pt is likely to be discharged before being able to get it notarized in the hospital, so we talked about how to complete AD after discharge, if necessary. Chaplain remains available.   This note was prepared by Chaplain Resident, Tacy Learn, MDiv. Chaplain remains available as needed through the on-call pager: [phone].

## 2020-09-28 NOTE — Progress Notes (Signed)
Progress Note  Patient Name: Mark Dickerson Date of Encounter: 09/28/2020  [facility] Cardiologist: No primary care provider on file. Dr. Tresa Endo  We have been reconsulted postoperatively by Dr.Hawken for new onset chest pain with elevated troponin, and volume overload.  Subjective   Remains anxious, and appears frustrated.  Wants to go home.  No further chest pain.  Ambulated in the hallway without difficulty.  Inpatient Medications    Scheduled Meds: . acetaminophen  650 mg Oral Q6H  . atorvastatin  40 mg Oral Daily  . benazepril  20 mg Oral Daily  . Chlorhexidine Gluconate Cloth  6 each Topical Daily  . docusate sodium  100 mg Oral Daily  . insulin aspart  0-15 Units Subcutaneous TID WC  . metoprolol tartrate  50 mg Oral BID  . omega-3 acid ethyl esters  2 g Oral BID  . pantoprazole  40 mg Oral Daily  . pneumococcal 23 valent vaccine  0.5 mL Intramuscular Tomorrow-1000  . sodium chloride flush  10-40 mL Intracatheter Q12H  . sodium chloride flush  3 mL Intravenous Q12H   Continuous Infusions: . sodium chloride    . sodium chloride Stopped (09/27/20 0814)  . heparin 1,250 Units/hr (09/27/20 2100)   PRN Meds: sodium chloride, alum & mag hydroxide-simeth, bisacodyl, guaiFENesin-dextromethorphan, hydrALAZINE, labetalol, metoprolol tartrate, morphine injection, ondansetron, oxyCODONE, oxyCODONE-acetaminophen, phenol, potassium chloride, sodium chloride flush, sodium chloride flush   Vital Signs    Vitals:   09/27/20 2248 09/28/20 0257 09/28/20 0928 09/28/20 1116  BP: 138/72 (!) 149/76 (!) 142/82 (!) 165/80  Pulse: 85 78 87 88  Resp: 17 15 16 20   Temp: 98.3 F (36.8 C) 97.8 F (36.6 C) 98.1 F (36.7 C) 98.1 F (36.7 C)  TempSrc: Oral Oral Oral Oral  SpO2: 94% 94% 97% 100%  Weight:      Height:        Intake/Output Summary (Last 24 hours) at 09/28/2020 1338 Last data filed at 09/28/2020 1221 Gross per 24 hour  Intake 892.62 ml  Output 5100 ml  Net -4207.38 ml    Last 3 Weights 09/27/2020 09/26/2020 09/25/2020  Weight (lbs) 197 lb 12 oz 201 lb 4.5 oz 184 lb 15.5 oz  Weight (kg) 89.7 kg 91.3 kg 83.9 kg      Telemetry    NSR 93 LBBB- Personally Reviewed  ECG    NSR rate 98, LBBB- Personally Reviewed  Physical Exam   GEN: wake and alert.  Sitting in chair.  No acute distress.  Neck:  JVP roughly 8 cm water.  Soft bruit. Cardiac:  RRR, normal S1-S2, no M/R/G.   Respiratory:  Mostly CTA B,, diminished breath sounds at bases but otherwise nonlabored. GI: Mild abdominal tenderness from surgery, but otherwise soft/NT/ND for NABS.   MS:  Puffy left hand swelling but no real edema.  Diminished pedal pulse with warm.  Neuro:   A&O x3, moves all extremities.  Nonfocal. Psych: Stressed out/anxious  Labs    High Sensitivity Troponin:   Recent Labs  Lab 09/26/20 1507 09/26/20 1634 09/26/20 1949 09/26/20 2204 09/27/20 0246  TROPONINIHS 2,219* 2,056* 1,860* 1,913* 1,992*      Chemistry Recent Labs  Lab 09/25/20 0427 09/26/20 0057 09/27/20 0246 09/28/20 0339  NA 136 138 137 135  K 4.2 4.2 3.6 3.9  CL 109 111 106 102  CO2 19* 19* 22 23  GLUCOSE 147* 125* 116* 123*  BUN 11 15 13 16   CREATININE 1.07 1.24 1.17 1.13  CALCIUM 7.9*  8.1* 8.6* 8.6*  PROT 4.8*  --   --   --   ALBUMIN 2.8*  --   --   --   AST 19  --   --   --   ALT 13  --   --   --   ALKPHOS 29*  --   --   --   BILITOT 0.7  --   --   --   GFRNONAA >60 >60 >60 >60  ANIONGAP 8 8 9 10      Hematology Recent Labs  Lab 09/27/20 0246 09/27/20 1651 09/28/20 0339  WBC 8.7 8.8 8.7  RBC 2.06* 3.05* 3.11*  HGB 7.1* 9.4* 9.4*  HCT 20.9* 27.9* 28.4*  MCV 101.5* 91.5 91.3  MCH 34.5* 30.8 30.2  MCHC 34.0 33.7 33.1  RDW 14.1 19.2* 19.7*  PLT 161 158 176    BNPNo results for input(s): BNP, PROBNP in the last 168 hours.   DDimer No results for input(s): DDIMER in the last 168 hours.   Radiology    No results found.  Cardiac Studies   Echocardiogram 09/20/2020 LVEF 55  to 60%, RV size and function normal, normal RVSP, no valvular heart disease.  Patient Profile     75 y.o. male with a history of DM-2, HTN and HLD as well as longstanding tobacco abuse now with significant PAD status post aortobifem bypass with left femoral below-knee pop bypass who now has had an episode of prolonged chest pain and elevated troponin concerning for non-STEMI  Assessment & Plan     Non-STEMI (troponin up from 2050 up to 2219 now back down to 2050) flat trend, but symptoms sound concerning for ACS, cannot exclude -> volume related demand ischemia from diastolic dysfunction. Interesting troponin trend-not usually consistent with ACS pattern, however he did have chest pain prior to the troponins being checked.  Also noted the same time was that he was anemic and requiring blood transfusions.  No further chest pain after treatments transfusion.  He is due to complete complete IV heparin after 48 hours which would be 5 cm.  I want to shorten that briefly in order to allow him to ambulate while off of heparin.  If he does fine with no active anginal symptoms, would probably be safe to discharge later on today.  Troponin levels actually were flat.  This would not easily be consistent with ACS, however the symptoms are more consistent with ACS. => Would like to try to do 48 hours of IV heparin, but with drop in hemoglobin will defer to vascular surgery.  Increase metoprolol to 50 mg twice daily.  Restart his ACE inhibitor, but will initially begin at one half dose.  This to reassess in outpatient and increase back to full dose if necessary.  He started on statin.  Will add Imdur.  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  Breathing team okay, and would potentially give 1 more dose of Lasix.  HTN: BP definitely back elevated.  Titrating up beta-blocker to 50 mg twice daily and restart ACE inhibitor at one half dose.  HLD: On statin at home,  LDL relatively controlled was 76.  We will restart statin.,  DM-2: On SSI  Per patient request treating medically for his elevated troponin/non-STEMI. => In order to x-ray potential discharge tonight,.  Stop heparin at 230.  Would like to see how he does ambulating in the hallway being off of anticoagulation.   I have added  back his ACE inhibitor and increase the beta-blocker.  We will also put him on nitrate plus PRN nitroglycerin. If possible, would start aspirin 81 mg/Plavix 75 mg-> on discharge.  Plan will be for outpatient ischemic evaluation with coronary CTA.  We can arrange for him to be seen in close follow-up to discuss scheduling CTA and further evaluation.     For questions or updates, please contact [facility] Please consult [email or web address] for contact info under        Signed, Bryan Lemma, MD  09/28/2020, 1:38 PM

## 2020-09-28 NOTE — Progress Notes (Signed)
ANTICOAGULATION CONSULT NOTE  Pharmacy Consult for heparin Indication: chest pain/ACS  Heparin Dosing Weight: 83.9 kg  Labs: Recent Labs    09/26/20 0057 09/26/20 1830 09/27/20 0246 09/27/20 1651 09/28/20 0339  HGB 8.4*  --  7.1* 9.4* 9.4*  HCT 24.7*  --  20.9* 27.9* 28.4*  PLT 169  --  161 158 176  HEPARINUNFRC  --    < > 0.29* <0.10* 0.67  CREATININE 1.24  --  1.17  --  1.13   < > = values in this interval not displayed.    Assessment: 62 yom presenting with atherosclerosis of native LLE with gangrene, now s/p aortobifemoral bypass and L fem below knee popliteal artery bypass on 3/14. Patient now with elevated troponin. Pharmacy consulted to dose heparin with no bolus requested per MD. Patient is not on anticoagulation PTA.  Patient now refusing cath. Planning 48hrs of heparin per Cards.  Heparin level therapeutic (0.67) on gtt at 1250 units/hr.  Goal of Therapy:  Heparin level 0.3-0.7 units/ml Monitor platelets by anticoagulation protocol: Yes   Plan:  Continue heparin at 1250 units/hr (end time 3/18 at 1700) No further levels needed  Christoper Fabian, PharmD, BCPS Please see amion for complete clinical pharmacist phone list 09/28/2020 5:46 AM

## 2020-09-28 NOTE — Care Management (Signed)
09-28-20 1729 Case Manager received a call from the Physical Therapist regarding 3n1. Order was written for rolling walker and that was delivered via [employer]. Case Manager called [facility] and a 3n1 is available- staff to pick up and deliver to the patient prior to transition home. Patient will need an order for the 3n1, so the patient can be charged for the one delivered. Message has been sent to the provider and the RN. No further needs from Case Manager at this time. Gala Lewandowsky, RN,BSN Case Manager

## 2020-09-28 NOTE — TOC Initial Note (Signed)
Transition of Care [facility]) - Initial/Assessment Note    Patient Details  Name: Mark Dickerson MRN: [id number] Date of Birth: [birth year]  Transition [facility] Care [facility]) CM/SW Contact:    Gala Lewandowsky, RN Phone Number: 09/28/2020, 4:25 PM  Clinical Narrative:  Plan for patient to transition home. Case Manager spoke with the patient and he asked me to speak with his daughter. Patient will transition home with daughter in [place]. Daughter is agreeable to home health physical therapy. Case Manager was able to call [facility] and they are able to service the patient on Monday. [facility] is aware to call the daughter with start of care times. Durable medical equipment (DME) rolling walker ordered via [employer] and will be delivered to the room prior to transition home.               Expected Discharge Plan: Home w Home Health Services Barriers to Discharge: No Barriers Identified   Patient Goals and CMS Choice Patient states their goals for this hospitalization and ongoing recovery are:: to return home   Choice offered to / list presented to : NA  Expected Discharge Plan and Services Expected Discharge Plan: Home w Home Health Services In-house Referral: NA Discharge Planning Services: CM Consult Post Acute Care Choice: Home Health,Durable Medical Equipment Living arrangements for the past 2 months: Single Family Home Expected Discharge Date: 09/28/20               DME Arranged: Dan Humphreys rolling DME Agency: [employer] Date DME Agency Contacted: 09/28/20 Time DME Agency Contacted: [phone] Representative spoke with at DME Agency: zach HH Arranged: PT HH Agency: [facility] Date [facility] Agency Contacted: 09/28/20 Time HH Agency Contacted: [phone] Representative spoke with at [facility] Agency: Kandee Keen  Prior Living Arrangements/Services Living arrangements for the past 2 months: Single Family Home Lives with:: Self (going to daughters address [address] [place] [address]) Patient language and need for  interpreter reviewed:: Yes        Need for Family Participation in Patient Care: Yes (Comment) Care giver support system in place?: Yes (comment)   Criminal Activity/Legal Involvement Pertinent to Current Situation/Hospitalization: No - Comment as needed  Activities of Daily Living Home Assistive Devices/Equipment: Cane (specify quad or straight) ADL Screening (condition at time of admission) Patient's cognitive ability adequate to safely complete daily activities?: Yes Is the patient deaf or have difficulty hearing?: No Does the patient have difficulty seeing, even when wearing glasses/contacts?: No Does the patient have difficulty concentrating, remembering, or making decisions?: No Patient able to express need for assistance with ADLs?: Yes Does the patient have difficulty dressing or bathing?: No Independently performs ADLs?: Yes (appropriate for developmental age) Does the patient have difficulty walking or climbing stairs?: Yes Weakness of Legs: Left Weakness of Arms/Hands: None  Permission Sought/Granted Permission sought to share information with : Facility Scientist, research (physical sciences) granted to share information with : Yes, Verbal Permission Granted     Permission granted to share info w AGENCY: [employer]        Emotional Assessment Appearance:: Appears stated age Attitude/Demeanor/Rapport: Engaged Affect (typically observed): Appropriate Orientation: : Oriented to Situation,Oriented to  Time,Oriented to Place,Oriented to Self Alcohol / Substance Use: Not Applicable Psych Involvement: No (comment)  Admission diagnosis:  Atherosclerosis of native artery of left lower extremity with gangrene [facility]) [I70.262] Patient Active Problem List   Diagnosis Date Noted  . 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/19/2020  PCP:  Marlyn Corporal, PA Pharmacy:   [facility] [address] [name] - [address] [facility] [name] [address] Phone: [phone] Fax: [phone]  Readmission Risk Interventions No flowsheet data found.

## 2020-09-28 NOTE — Discharge Instructions (Signed)
 [facility]  Discharge Instructions   Open Aortic Surgery  Please refer to the following instructions for your post-procedure care. Your surgeon or Physician Assistant will discuss any changes with you.  Activity  Avoid lifting more than eight pounds (a gallon of milk) until after your first post-operative visit. You are encouraged to walk as much as you can. You can slowly return to normal activities but must avoid strenuous activity and heavy lifting until your doctor tells you it's okay. Heavy lifting can hurt the incision and cause a hernia. Avoid activities such as vacuuming or swinging a golf club. It is normal to feel tired for several weeks after your surgery. Do not drive until your doctor gives the okay and you are no longer taking prescription pain medications. It is also normal to have difficulty with sleep habits, eating and bowl movements after surgery. These will go away with time.  Bathing/Showering  Shower daily after you go home. Do not soak in a bathtub, hot tub, or swim until the incision heals.  Incision Care  Shower every day. Clean your incision with mild soap and water. Pat the area dry with a clean towel. You do not need a bandage unless otherwise instructed. Do not apply any ointments or creams to your incision. You may have skin glue on your incision. Do not peel it off. It will come off on its own in about one week. If you have staples or sutures along your incision, they will be removed at your post op appointment.  If you have groin incisions, wash the groin wounds with soap and water daily and pat dry. (No tub bath-only shower)  Then put a dry gauze or washcloth in the groin to keep this area dry to help prevent wound infection.  Do this daily and as needed.  Do not use Vaseline or neosporin on your incisions.  Only use soap and water on your incisions and then protect and keep dry.  Diet  Resume your normal diet. There are no  special food restriction following this procedure. A low fat/low cholesterol diet is recommended for all patients with vascular disease. After your aortic surgery, it's normal to feel full faster than usual and to not feel as hungry as you normally would. You will probably lose weight initially following your surgery. It's best to eat small, frequent meals over the course of the day. Call the office if you find that you are unable to eat even small meals.   In order to heal from your surgery, it is CRITICAL to get adequate nutrition. Your body requires vitamins, minerals, and protein. Vegetables are the best source of vitamins and minerals. If you have pain, you may take over-the-counter pain reliever such as acetaminophen (Tylenol). If you were prescribed a stronger pain medication, please be aware these medication can cause nausea and constipation. Prevent nausea by taking the medication with a snack or meal. Avoid constipation by drinking plenty of fluids and eating foods with a high amount of fiber, such as fruits, vegetables and grains. Take 100mg of the over-the-counter stool softener Colace twice a day as needed to help with constipation. A laxative, such as Milk of Magnesia, may be recommended for you at this time. Do not take a laxative unless your surgeon or P.A. tells you it's OK.  Do not take Tylenol if you are taking stronger pain medications (such as Percocet).  Follow Up  Our office will schedule a follow up   appointment 2-3 weeks after discharge.  Please call us immediately for any of the following conditions    .     Severe or worsening pain in your legs or feet or in your abdomen back or chest. Increased pain, redness drainage (pus) from your incision site. Increased abdominal pain, bloating, nausea, vomiting, or persistent diarrhea. Fever of 101 degrees or higher. Swelling in your leg (s).  Reduce your risk of vascular disease  Stop smoking. If you would like help, call  [facility] at 1-800-QUIT-NOW (1-800-784-8669) or [place] at 336-586-4000. Manage your cholesterol Maintain a desired weight Control your diabetes Keep your blood pressure down  If you have any questions please call the office at [phone].   

## 2020-09-28 NOTE — Care Management Important Message (Signed)
Important Message  Patient Details  Name: Mark Dickerson MRN: [id number] Date of Birth: September 11, 1945   Medicare Important Message Given:  Yes     [name] Stefan Church 09/28/2020, 2:48 PM

## 2020-09-28 NOTE — Progress Notes (Signed)
Pt discharged to home with family.  Pt taken off telemetry and CCMD notified.  Pt's IV and midline removed. Pt left with all of their personal belongings.  Pt left with walker and 3-in-1.  AVS documentation reviewed and sent home with Pt and daughter and all questions answered.

## 2020-09-28 NOTE — Progress Notes (Signed)
Pt ambulated in hallway 60 minutes after discontinuing heparin.  Pt had no angina, SOB, or dizziness during ambulation.

## 2020-09-28 NOTE — Telephone Encounter (Signed)
Per Edd Fabian, FNP-C: pt was not told at discharge to stop pravastatin 20mg  and start atorvastatin 40mg . please call pt to discuss/start medication.  Called Daughter and tried to give her message above. She states that pt is not discharged yet, she will ask "the nurse" at discharge

## 2020-09-28 NOTE — Discharge Summary (Signed)
Aorta Discharge Summary    Mark Dickerson 1945-10-03 75 y.o. male  [id number]  Admission Date: 09/19/2020  Discharge Date: 09/28/2020  Physician: No att. providers found  Admission Diagnosis: Atherosclerosis of native artery of left lower extremity with gangrene [facility]) [I70.262]  Discharge Day services:   Physical therapy  Hospital Course:  The patient was admitted to the hospital and taken to the operating room on 09/24/2020 and underwent: 1) Aortic endarterectomy (dictated by Dr. Chestine Spore) 2) Aorto-bi-femoral bypass (16 x 8mm Dacron) 3) Left common femoral extended endarterectomy and profundaplasty (dictated by Dr. Chestine Spore) 4) Right common femoral extended endarterectomy and profundaplasty 5) Left greater saphenous vein harvest (dictated by Dr. Chestine Spore) 6) Left common femoral to below knee popliteal artery bypass with non-reversed greater saphenous vein in anatomic tunnel 7) Bilateral lower extremity angiography The pt tolerated the procedure well and was transported to the PACU in good condition.   POD#1 patient did well overnight. Some issues with pain control. Incisions remained clean, dry and intact. Adequate perfusion of bilateral lower extremities. Minimal bowel sounds. Good urine output. Foley continued. Stable Scr. Hemodynamically stable otherwise. Sliding scale insulin orders placed. Encourage mobilization. PT/ OT evaluated with recommendation for home health PT/ OT  POD#2 some improvement of overall pain. Continued adequate perfusion of bilateral lower extremities. Incisions remained intact and dry. Bowel sounds present and passing flatus. Vitals remained stable. Small increase in Scr. Good UOP. Some post operative blood loss anemia with Hgb 8.4 but stable without symptoms. Given IV bolus of fluid and IVF increased. Albumin given. Clear liquid diet started.Stable for transfer out of ICU. Patient later developed chest pain. Troponin ordered which results were elevated at >2000.  Cardiology was consulted. Normal sinus rhythm on telemetry with Left BBB. Diagnosed with Non-Stemi. IV heparin ordered. Troponin's cycled. Low dose beta blocker stated. Gentle diureses initiated. Recommended catheterization  POD#3 chest pain resolved. Not wanting catheterization after just having had major surgery. Tolerating mobilization. No bowel movement yet. BiIlateral lower extremities remained well perfused and warm with doppler signals. Good Urine output. Tolerating liquid diet.  Hemoglobin down to 7.1. 2 units of Packed red blood cells ordered. Continued diuresis. Encouraged increased mobilization. Cardiology okay with medical management for now. Troponin's flat.  Heparin IV and Aspirin and statin. Toprol 25 mg BID increased.  The remainder of the hospital course consisted of increasing mobilization and increasing intake of solids without difficulty.  POD#4 Improved pain control. Patient wanting to go home. Hemodynamically stable with improvement of Hemoglobin post transfusion. Improved renal function back to baseline with fluid hydration. Bilateral lower extremities well perfused. Bilateral groin and laparotomy Incisions clean, dry and intact. Vitals stable. Afebrile. No bowel movement but passing flatus and tolerated diet. Cardiac cath postponed and medical management continued. Home health PT arranged. All home DME needs arranged. Patient transitioned back to [facility] for discharge. Cardiology recommended stopping IV heparin at 2:30 pm and then ambulating after 30 minutes. If no angina felt he was okay for discharge home. Patient tolerated ambulation without any symptoms. Patient to follow up with cardiology as outpatient for further workup. Follow up arranged with Dr. Lenell Antu on 10/30/20 with non invasive studies  CBC    Component Value Date/Time   WBC 8.7 09/28/2020 0339   RBC 3.11 (L) 09/28/2020 0339   HGB 9.4 (L) 09/28/2020 0339   HCT 28.4 (L) 09/28/2020 0339   PLT 176 09/28/2020 0339    MCV 91.3 09/28/2020 0339   MCH 30.2 09/28/2020 0339   MCHC 33.1 09/28/2020 0339  RDW 19.7 (H) 09/28/2020 0339    BMET    Component Value Date/Time   NA 135 09/28/2020 0339   K 3.9 09/28/2020 0339   CL 102 09/28/2020 0339   CO2 23 09/28/2020 0339   GLUCOSE 123 (H) 09/28/2020 0339   BUN 16 09/28/2020 0339   CREATININE 1.13 09/28/2020 0339   CALCIUM 8.6 (L) 09/28/2020 0339   GFRNONAA >60 09/28/2020 7035     Discharge Instructions    Discharge patient   Complete by: As directed    If he ambulates later today and tolerates it well with no anginal symptoms   Discharge disposition: 01-Home or Self Care   Discharge patient date: 09/28/2020      Discharge Diagnosis:  Atherosclerosis of native artery of left lower extremity with gangrene (HCC) [I70.262]  Secondary Diagnosis: Patient Active Problem List   Diagnosis Date Noted  . 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/19/2020   Past Medical History:  Diagnosis Date  . Diabetes mellitus without complication (HCC)   . Hyperlipidemia   . Hypertension   . Tobacco abuse      Allergies as of 09/28/2020   No Known Allergies     Medication List    STOP taking these medications   amoxicillin-clavulanate 875-125 MG tablet Commonly known as: Augmentin   neomycin-polymyxin-hydrocortisone OTIC solution Commonly known as: CORTISPORIN     TAKE these medications   acetaminophen 500 MG tablet Commonly known as: TYLENOL Take 500 mg by mouth every 6 (six) hours as needed for moderate pain.   amLODipine 10 MG tablet Commonly known as: NORVASC Take 10 mg by mouth daily.   aspirin EC 81 MG tablet Take 1 tablet (81 mg total) by mouth daily. Swallow whole.   benazepril 20 MG tablet Commonly known as: LOTENSIN Take 1 tablet (20 mg total) by mouth daily. What changed:   medication strength  how much to take   clopidogrel 75 MG tablet Commonly known as: Plavix Take 1 tablet (75 mg  total) by mouth daily.   Coenzyme Q10 200 MG capsule Take 200 mg by mouth daily.   fenofibrate 145 MG tablet Commonly known as: TRICOR Take 145 mg by mouth daily.   hydrochlorothiazide 25 MG tablet Commonly known as: HYDRODIURIL Take 25 mg by mouth daily.   isosorbide mononitrate 30 MG 24 hr tablet Commonly known as: IMDUR Take 1 tablet (30 mg total) by mouth daily.   metFORMIN 500 MG 24 hr tablet Commonly known as: GLUCOPHAGE-XR Take 500 mg by mouth 2 (two) times daily.   metoprolol tartrate 50 MG tablet Commonly known as: LOPRESSOR Take 1 tablet (50 mg total) by mouth 2 (two) times daily.   nitroGLYCERIN 0.4 MG SL tablet Commonly known as: NITROSTAT Place 1 tablet (0.4 mg total) under the tongue every 5 (five) minutes as needed for chest pain.   oxyCODONE-acetaminophen 5-325 MG tablet Commonly known as: PERCOCET/ROXICET Take 1-2 tablets by mouth every 4 (four) hours as needed for moderate pain.   Symbicort 160-4.5 MCG/ACT inhaler Generic drug: budesonide-formoterol Inhale 2 puffs into the lungs 2 (two) times daily as needed (shortness of breath).   Vitamin D (Ergocalciferol) 1.25 MG (50000 UNIT) Caps capsule Commonly known as: DRISDOL Take 50,000 Units by mouth once a week.       Instructions:  [facility] Discharge Instructions Open Aortic Surgery  Please refer to the following instructions for your post-procedure care. Your surgeon or Physician Assistant will  discuss any changes with you.  Activity  Avoid lifting more than eight pounds (a gallon of milk) until after your first post-operative visit. You are encouraged to walk as much as you can. You can slowly return to normal activities but must avoid strenuous activity and heavy lifting until your doctor tells you it's OK. Heavy lifting can hurt the incision and cause a hernia. Avoid activities such as vacuuming or swinging a golf club. It is normal to feel tired for several  weeks after your surgery. Do not drive until your doctor gives the OK and you are no longer taking prescription pain medications. It is also normal to have difficulty with sleep habits, eating and bowl movements after surgery. These will go away with time.  Bathing/Showering  You may shower after you go home. Do not soak in a bathtub, hot tub, or swim until the incision heals.  Incision Care  Shower every day. Clean your incision with mild soap and water. Pat the area dry with a clean towel. You do not need a bandage unless otherwise instructed. Do not apply any ointments or creams to your incision. You may have skin glue on your incision. Do not peel it off. It will come off on its own in about one week. If you have staples or sutures along your incision, they will be removed at your post op appointment.  If you have groin incisions, wash the groin wounds with soap and water daily and pat dry. (No tub bath-only shower)  Then put a dry gauze or washcloth in the groin to keep this area dry to help prevent wound infection.  Do this daily and as needed.  Do not use Vaseline or neosporin on your incisions.  Only use soap and water on your incisions and then protect and keep dry.  Diet  Resume your normal diet. There are no special food restriction following this procedure. A low fat/low cholesterol diet is recommended for all patients with vascular disease. After your aortic surgery, it's normal to feel full faster than usual and to not feel as hungry as you normally would. You will probably lose weight initially following your surgery. It's best to eat small, frequent meals over the course of the day. Call the office if you find that you are unable to eat even small meals. In order to heal from your surgery, it is CRITICAL to get adequate nutrition. Your body requires vitamins, minerals, and protein. Vegetables are the best source of vitamins and minerals. causing pain, you may take over-the-counter pain  reliever such as acetaminophen (Tylenol). If you were prescribed a stronger pain medication, please be aware these medication can cause nausea and constipation. Prevent nausea by taking the medication with a snack or meal. Avoid constipation by drinking plenty of fluids and eating foods with a high amount of fiber, such as fruits, vegetables and grains. Take 100mg  of the over-the-counter stool softener Colace twice a day as needed to help with constipation. A laxative, such as Milk of Magnesia, may be recommended for you at this time. Do not take a laxative unless your surgeon or Physician Assistant. tells you it's OK. Do not take Tylenol if you are taking stronger pain medications (such as Percocet).  Follow Up  Our office will schedule a follow up appointment 2-3 weeks after discharge.  Please call immediately for any of the following conditions    .     Severe or worsening pain in your legs or feet or  in your abdomen back or chest. . Increased pain, redness drainage (pus) from your incision site. . Increased abdominal pain, bloating, nausea, vomiting, or persistent diarrhea. . Fever of 101 degrees or higher. . Swelling in your leg (s). .  Reduce your risk of vascular disease  . Stop smoking. If you would like help, call [facility] at 1-800-QUIT-NOW (320-771-18501-808 607 9960) or [place] at (785)550-7729248-751-8808. . Manage your cholesterol . Maintain a desired weight . Control your diabetes . Keep your blood pressure down .  If you have any questions please call the office at [phone].   Prescriptions given: 1.  Hydrocodone-Acetaminophen 5-325 mg #30 No Refill  Disposition: Home  Patient's condition: is Good  Follow up: 1. Dr.Haweken  in 4 weeks   Emilie RutterMatthew Eveland, PA-C [facility] [phone] 10/01/2020  8:15 AM   - For VQI Registry use -  Post-op:  Time to Extubation: [X]  In OR, [ ]  < 12 hrs, [ ]  12-24 hrs, [ ]  >=24 hrs Vasopressors Req. Post-op: Yes ICU  Stay: 2 days Transfusion: Yes   If yes, 2 units given MI: Yes, Arly.Keller[X ] Troponin only, Arly.Keller[X ] EKG or Clinical New Arrhythmia: Yes  Complications: CHF: No Resp failure: No, [ ]  Pneumonia, [ ]  Ventilator Chg in renal function: No, [ ]  Inc. Cr > 0.5, [ ]  Temp. Dialysis, [ ]  Permanent dialysis Leg ischemia: No, no Surgery needed, [ ]  Yes, Surgery needed, [ ]  Amputation Bowel ischemia: No, [ ]  Medical Rx, [ ]  Surgical Rx Wound complication: No, [ ]  Superficial separation/infection, [ ]  Return to OR Return to OR: No  Return to OR for bleeding: No Stroke: No, [ ]  Minor, [ ]  Major  Discharge medications: Statin use:  Yes If No:   ASA use:  Yes  If No:   Plavix use:  Yes  Beta blocker use:  Yes  ACEI use:  Yes ARB use:  No CCB use:  Yes Coumadin use:  No

## 2020-10-01 DIAGNOSIS — Z48812 Encounter for surgical aftercare following surgery on the circulatory system: Secondary | ICD-10-CM | POA: Diagnosis not present

## 2020-10-01 DIAGNOSIS — Z7984 Long term (current) use of oral hypoglycemic drugs: Secondary | ICD-10-CM | POA: Diagnosis not present

## 2020-10-01 DIAGNOSIS — L97429 Non-pressure chronic ulcer of left heel and midfoot with unspecified severity: Secondary | ICD-10-CM | POA: Diagnosis not present

## 2020-10-01 DIAGNOSIS — L97528 Non-pressure chronic ulcer of other part of left foot with other specified severity: Secondary | ICD-10-CM | POA: Diagnosis not present

## 2020-10-01 DIAGNOSIS — I10 Essential (primary) hypertension: Secondary | ICD-10-CM | POA: Diagnosis not present

## 2020-10-01 DIAGNOSIS — E1151 Type 2 diabetes mellitus with diabetic peripheral angiopathy without gangrene: Secondary | ICD-10-CM | POA: Diagnosis not present

## 2020-10-01 DIAGNOSIS — I70245 Atherosclerosis of native arteries of left leg with ulceration of other part of foot: Secondary | ICD-10-CM | POA: Diagnosis not present

## 2020-10-01 DIAGNOSIS — E785 Hyperlipidemia, unspecified: Secondary | ICD-10-CM | POA: Diagnosis not present

## 2020-10-01 DIAGNOSIS — R69 Illness, unspecified: Secondary | ICD-10-CM | POA: Diagnosis not present

## 2020-10-01 DIAGNOSIS — I70244 Atherosclerosis of native arteries of left leg with ulceration of heel and midfoot: Secondary | ICD-10-CM | POA: Diagnosis not present

## 2020-10-02 ENCOUNTER — Telehealth: Payer: Self-pay | Admitting: General Practice

## 2020-10-02 NOTE — Telephone Encounter (Signed)
New Message:  .   Pt have been having loose stools and urgency to go when this happen. Daughter is concerned about whether this could be coming from some of the medicine. She says he is on a lot of medicine. I asked her if it was a particular medicine, she said she could not tell.

## 2020-10-02 NOTE — Telephone Encounter (Signed)
Asked Raquel Pharm D review new cardiac medications and was suggested patient take Metoprolol with food and try probiotics  Asked daughter how many stools daily and any foul odor. Around 2 stools, no odor.  Recommendations reviewed with daughter Patient also had issue of sudden urgency with urine as well  Advised to call PCP to discuss, verbalized understanding

## 2020-10-03 ENCOUNTER — Telehealth: Payer: Self-pay

## 2020-10-03 MED ORDER — ATORVASTATIN CALCIUM 40 MG PO TABS: 40.0000 mg | ORAL_TABLET | Freq: Every day | ORAL | 3 refills | Status: DC

## 2020-10-03 NOTE — Telephone Encounter (Signed)
Patient's daughter called to request [facility] - MD approved - will have Chanda set up today - patient had 2 episodes of diarrhea 2 days ago, but has since been having solid bowel movements.  Prior to aortabifem on 3/14 - he had some wounds on his feet - he has developed some blistering. Offered a sooner appt, she is going to have HH take a look when they come out and call back if needed.

## 2020-10-03 NOTE — Addendum Note (Signed)
Addended by: Alyson Ingles on: 10/03/2020 08:23 AM   Modules accepted: Orders

## 2020-10-03 NOTE — Telephone Encounter (Signed)
Per Edd Fabian, FNP-C: pt was not told at discharge to stop pravastatin 20mg  and start atorvastatin 40mg . please call pt to discuss/start medication.  Called Daughter and tried to give her message above. Sent rx to requested [facility]. She will CB with any concerns

## 2020-10-04 DIAGNOSIS — E1151 Type 2 diabetes mellitus with diabetic peripheral angiopathy without gangrene: Secondary | ICD-10-CM | POA: Diagnosis not present

## 2020-10-04 DIAGNOSIS — I70245 Atherosclerosis of native arteries of left leg with ulceration of other part of foot: Secondary | ICD-10-CM | POA: Diagnosis not present

## 2020-10-04 DIAGNOSIS — Z7984 Long term (current) use of oral hypoglycemic drugs: Secondary | ICD-10-CM | POA: Diagnosis not present

## 2020-10-04 DIAGNOSIS — L97429 Non-pressure chronic ulcer of left heel and midfoot with unspecified severity: Secondary | ICD-10-CM | POA: Diagnosis not present

## 2020-10-04 DIAGNOSIS — R69 Illness, unspecified: Secondary | ICD-10-CM | POA: Diagnosis not present

## 2020-10-04 DIAGNOSIS — I70244 Atherosclerosis of native arteries of left leg with ulceration of heel and midfoot: Secondary | ICD-10-CM | POA: Diagnosis not present

## 2020-10-04 DIAGNOSIS — Z48812 Encounter for surgical aftercare following surgery on the circulatory system: Secondary | ICD-10-CM | POA: Diagnosis not present

## 2020-10-04 DIAGNOSIS — I10 Essential (primary) hypertension: Secondary | ICD-10-CM | POA: Diagnosis not present

## 2020-10-04 DIAGNOSIS — E785 Hyperlipidemia, unspecified: Secondary | ICD-10-CM | POA: Diagnosis not present

## 2020-10-04 DIAGNOSIS — L97528 Non-pressure chronic ulcer of other part of left foot with other specified severity: Secondary | ICD-10-CM | POA: Diagnosis not present

## 2020-10-05 ENCOUNTER — Telehealth: Payer: Self-pay

## 2020-10-05 ENCOUNTER — Other Ambulatory Visit: Payer: Self-pay

## 2020-10-05 ENCOUNTER — Ambulatory Visit (INDEPENDENT_AMBULATORY_CARE_PROVIDER_SITE_OTHER): Payer: Medicare HMO | Admitting: Physician Assistant

## 2020-10-05 VITALS — BP 113/62 | HR 70 | Temp 98.6°F | Resp 20 | Ht 71.0 in | Wt 194.0 lb

## 2020-10-05 DIAGNOSIS — I70245 Atherosclerosis of native arteries of left leg with ulceration of other part of foot: Secondary | ICD-10-CM | POA: Diagnosis not present

## 2020-10-05 DIAGNOSIS — Z48812 Encounter for surgical aftercare following surgery on the circulatory system: Secondary | ICD-10-CM | POA: Diagnosis not present

## 2020-10-05 DIAGNOSIS — R69 Illness, unspecified: Secondary | ICD-10-CM | POA: Diagnosis not present

## 2020-10-05 DIAGNOSIS — I10 Essential (primary) hypertension: Secondary | ICD-10-CM | POA: Diagnosis not present

## 2020-10-05 DIAGNOSIS — Z7984 Long term (current) use of oral hypoglycemic drugs: Secondary | ICD-10-CM | POA: Diagnosis not present

## 2020-10-05 DIAGNOSIS — Z95828 Presence of other vascular implants and grafts: Secondary | ICD-10-CM

## 2020-10-05 DIAGNOSIS — L97528 Non-pressure chronic ulcer of other part of left foot with other specified severity: Secondary | ICD-10-CM | POA: Diagnosis not present

## 2020-10-05 DIAGNOSIS — E1151 Type 2 diabetes mellitus with diabetic peripheral angiopathy without gangrene: Secondary | ICD-10-CM | POA: Diagnosis not present

## 2020-10-05 DIAGNOSIS — L97429 Non-pressure chronic ulcer of left heel and midfoot with unspecified severity: Secondary | ICD-10-CM | POA: Diagnosis not present

## 2020-10-05 DIAGNOSIS — I70244 Atherosclerosis of native arteries of left leg with ulceration of heel and midfoot: Secondary | ICD-10-CM | POA: Diagnosis not present

## 2020-10-05 DIAGNOSIS — E785 Hyperlipidemia, unspecified: Secondary | ICD-10-CM | POA: Diagnosis not present

## 2020-10-05 MED ORDER — CEPHALEXIN 500 MG PO CAPS: 500.0000 mg | ORAL_CAPSULE | Freq: Two times a day (BID) | ORAL | 0 refills | Status: DC

## 2020-10-05 NOTE — Progress Notes (Signed)
POST OPERATIVE OFFICE NOTE    CC:  F/u for surgery  HPI:  This is a 75 y.o. male who is s/p ABF bypass and left CFA to BK popliteal bypass with non reversed GSV on 09/24/2020 by Dr. Lenell Antu. His post op course was complicated by elevated troponins with chest pain.  He was started on heparin.  By POD 2, his CP had resolved.  He was anemic and received transfusion. Cardiac cath was put on hold.   Pt returns today with c/o increased swelling in both legs and developed blistering around the medial left ankle and a small blister on the dorsum of the right foot.  His daughter states that the 3rd left toe was kind of moist a few days ago but dry today and has had an odor.  Also some redness around the mid thigh and below knee incisions.  The thigh incision did have some clear orange drainage a couple of days ago when it opened up.  He has not had any fever or chills.   He is tolerating food and having BM and passing flatus.  He is using his pain medication sparingly.  He continues to take his asa/statin.  He states he is not smoking.   He did have some shortness of breath last night and EMS was called.  It had resolved by the time EMS arrived.   No Known Allergies  Current Outpatient Medications  Medication Sig Dispense Refill  . acetaminophen (TYLENOL) 500 MG tablet Take 500 mg by mouth every 6 (six) hours as needed for moderate pain.    Marland Kitchen amLODipine (NORVASC) 10 MG tablet Take 10 mg by mouth daily.    Marland Kitchen aspirin EC 81 MG tablet Take 1 tablet (81 mg total) by mouth daily. Swallow whole. 150 tablet 2  . atorvastatin (LIPITOR) 40 MG tablet Take 1 tablet (40 mg total) by mouth daily. 90 tablet 3  . benazepril (LOTENSIN) 20 MG tablet Take 1 tablet (20 mg total) by mouth daily. 30 tablet 1  . clopidogrel (PLAVIX) 75 MG tablet Take 1 tablet (75 mg total) by mouth daily. 30 tablet 11  . Coenzyme Q10 200 MG capsule Take 200 mg by mouth daily.    . fenofibrate (TRICOR) 145 MG tablet Take 145 mg by mouth  daily.    . hydrochlorothiazide (HYDRODIURIL) 25 MG tablet Take 25 mg by mouth daily.    . isosorbide mononitrate (IMDUR) 30 MG 24 hr tablet Take 1 tablet (30 mg total) by mouth daily. 30 tablet 1  . metFORMIN (GLUCOPHAGE-XR) 500 MG 24 hr tablet Take 500 mg by mouth 2 (two) times daily.    . metoprolol tartrate (LOPRESSOR) 50 MG tablet Take 1 tablet (50 mg total) by mouth 2 (two) times daily. 60 tablet 2  . nitroGLYCERIN (NITROSTAT) 0.4 MG SL tablet Place 1 tablet (0.4 mg total) under the tongue every 5 (five) minutes as needed for chest pain. 20 tablet 12  . oxyCODONE-acetaminophen (PERCOCET/ROXICET) 5-325 MG tablet Take 1-2 tablets by mouth every 4 (four) hours as needed for moderate pain. 30 tablet 0  . SYMBICORT 160-4.5 MCG/ACT inhaler Inhale 2 puffs into the lungs 2 (two) times daily as needed (shortness of breath).    . Vitamin D, Ergocalciferol, (DRISDOL) 1.25 MG (50000 UNIT) CAPS capsule Take 50,000 Units by mouth once a week.     No current facility-administered medications for this visit.     ROS:  See HPI  Physical Exam:  Today's Vitals   10/05/20  1519  BP: 113/62  Pulse: 70  Resp: 20  Temp: 98.6 F (37 C)  TempSrc: Temporal  SpO2: 97%  Weight: 194 lb (88 kg)  Height: 5\' 11"  (1.803 m)  PainSc: 10-Worst pain ever  PainLoc: Leg   Body mass index is 27.06 kg/m.   Incision:  Midline and groin incisions have healed nicely as well as proximal vein harvest incision.  Mid thigh and below knee incisions have erythema.  There is a small area of dehiscence around the mid thigh incision.  No drainage present today.     Extremities:  BLE with left>right; brisk doppler signals bilateral DP/PT/peroneal with left > right        Abdomen:  Soft, NT/ND   Assessment/Plan:  This is a 75 y.o. male who is s/p: ABF bypass and left CFA to BK popliteal bypass with non reversed GSV on 09/24/2020 by Dr. 09/26/2020  -pt with erythema to incisions mid thigh and below knee with small area  of dehiscence mid thigh incision.  Keflex 500mg  bid for 2 weeks sent electronically.   Dry dressing to mid thigh -pt with brisk doppler signals bilateral DP/PT/peroneal with left>right -he has had some swelling in the BLE, which is not expected after revascularization and vein harvest.  Discussed leg elevation and proper way to elevate legs and given diagram.  Also advised to continue walking regimen and elevate when he is not walking.  -dry gangrene of toes-discussed that time will determine if he will need amputation after demarcation.  -dry dressing to mid thigh incision.   -advised to keep log of BP in morning and evening and take to cardiology appt and they can adjust meds accordingly.   -he has stopped smoking.  Praised him for this and discussed importance of why he should not start back.   -pt has a good amount of pain medication left.  Will not sent prescription at this time, but if he needs a refill in the future, we can send to pharmacy at that time. -pt had f/u with Dr. Lenell Antu on 4/19.  Per Dr. Lenell Antu, will move this appointment up to 4/5 to see how he is doing.  Discussed if he has any issues before then, to contact 5/19.  They expressed understanding.   Randie Heinz, [facility] [phone]   Clinic MD:  Pt seen and examined with [facility]. [facility]

## 2020-10-05 NOTE — Telephone Encounter (Signed)
HH nurse called - she is very concerned about patient wound on L upper thigh - he is s/p aortobifem on 3/14. Wound is red, hot, open, painful and draining. Denies patient is running a fever. Added on to PA schedule today for wound check.

## 2020-10-07 ENCOUNTER — Telehealth: Payer: Self-pay | Admitting: Internal Medicine

## 2020-10-07 NOTE — Telephone Encounter (Signed)
I do not have any appointment openings that I know of until the virtual visit on the eighth (I think I may have 1 spot on the sixth as an in person visit).  I am in the hospital next week, and pretty much have a full schedule on the fourth as a DOD day.  We can try to see if we can get him into see one of her APP's next week, if he needs to be seen sooner than later.  I could then keep the appointment on April 8 as a backup.  Bryan Lemma, MD

## 2020-10-08 ENCOUNTER — Telehealth: Payer: Self-pay | Admitting: General Practice

## 2020-10-08 ENCOUNTER — Other Ambulatory Visit: Payer: Self-pay

## 2020-10-08 DIAGNOSIS — E1151 Type 2 diabetes mellitus with diabetic peripheral angiopathy without gangrene: Secondary | ICD-10-CM | POA: Diagnosis not present

## 2020-10-08 DIAGNOSIS — I70262 Atherosclerosis of native arteries of extremities with gangrene, left leg: Secondary | ICD-10-CM

## 2020-10-08 DIAGNOSIS — L97429 Non-pressure chronic ulcer of left heel and midfoot with unspecified severity: Secondary | ICD-10-CM | POA: Diagnosis not present

## 2020-10-08 DIAGNOSIS — Z48812 Encounter for surgical aftercare following surgery on the circulatory system: Secondary | ICD-10-CM | POA: Diagnosis not present

## 2020-10-08 DIAGNOSIS — Z7984 Long term (current) use of oral hypoglycemic drugs: Secondary | ICD-10-CM | POA: Diagnosis not present

## 2020-10-08 DIAGNOSIS — I10 Essential (primary) hypertension: Secondary | ICD-10-CM | POA: Diagnosis not present

## 2020-10-08 DIAGNOSIS — R69 Illness, unspecified: Secondary | ICD-10-CM | POA: Diagnosis not present

## 2020-10-08 DIAGNOSIS — I70245 Atherosclerosis of native arteries of left leg with ulceration of other part of foot: Secondary | ICD-10-CM | POA: Diagnosis not present

## 2020-10-08 DIAGNOSIS — L97528 Non-pressure chronic ulcer of other part of left foot with other specified severity: Secondary | ICD-10-CM | POA: Diagnosis not present

## 2020-10-08 DIAGNOSIS — I70244 Atherosclerosis of native arteries of left leg with ulceration of heel and midfoot: Secondary | ICD-10-CM | POA: Diagnosis not present

## 2020-10-08 DIAGNOSIS — E785 Hyperlipidemia, unspecified: Secondary | ICD-10-CM | POA: Diagnosis not present

## 2020-10-08 NOTE — Telephone Encounter (Signed)
Pt c/o Shortness Of Breath: STAT if SOB developed within the last 24 hours or pt is noticeably SOB on the phone  1. Are you currently SOB (can you hear that pt is SOB on the phone)? Patient's daughter states the patient is not currently SOB  2. How long have you been experiencing SOB?  Patient's daughter states the patient has had several episodes of SOB since the night of 09/14/20  3. Are you SOB when sitting or when up moving around? Both   4. Are you currently experiencing any other symptoms?  Chest tightness

## 2020-10-08 NOTE — Telephone Encounter (Signed)
Pts daughter calling to follow up from her previous My Chat message re: the pt.. he is still having increasing SOB with exertion and at rest and it comes and goes but enough that he is uncomfortable... I will forward back to Dr. Herbie Baltimore for review and recommendations with seeing the pt since the office does not appear to have any sooner appt available to see the pt prior to his already scheduled appt with Edd Fabian NP 10/19/20 at either location.

## 2020-10-08 NOTE — Telephone Encounter (Signed)
Unfortunately I do not have any availability to see him this week because I am in the hospital.  Perhaps we can get him into see an APP this week.  It is very hard to make these, just recalls simply via telephone call.  Even if it is a virtual call with an APP this week that would be better than nothing.  Bryan Lemma, MD

## 2020-10-09 ENCOUNTER — Telehealth: Payer: Self-pay | Admitting: General Practice

## 2020-10-09 DIAGNOSIS — I70262 Atherosclerosis of native arteries of extremities with gangrene, left leg: Secondary | ICD-10-CM | POA: Diagnosis not present

## 2020-10-09 DIAGNOSIS — J449 Chronic obstructive pulmonary disease, unspecified: Secondary | ICD-10-CM | POA: Diagnosis not present

## 2020-10-09 DIAGNOSIS — T8189XA Other complications of procedures, not elsewhere classified, initial encounter: Secondary | ICD-10-CM | POA: Diagnosis not present

## 2020-10-09 DIAGNOSIS — E7800 Pure hypercholesterolemia, unspecified: Secondary | ICD-10-CM | POA: Diagnosis not present

## 2020-10-09 DIAGNOSIS — E1169 Type 2 diabetes mellitus with other specified complication: Secondary | ICD-10-CM | POA: Diagnosis not present

## 2020-10-09 DIAGNOSIS — I214 Non-ST elevation (NSTEMI) myocardial infarction: Secondary | ICD-10-CM | POA: Diagnosis not present

## 2020-10-09 DIAGNOSIS — R69 Illness, unspecified: Secondary | ICD-10-CM | POA: Diagnosis not present

## 2020-10-09 DIAGNOSIS — I10 Essential (primary) hypertension: Secondary | ICD-10-CM | POA: Diagnosis not present

## 2020-10-09 NOTE — Telephone Encounter (Signed)
Patients wife called and stated that someone was supposed to call her to get an earlier appt for patient. Please call back

## 2020-10-09 NOTE — Telephone Encounter (Signed)
Returned call to daughter-appt scheduled for Friday 4/1 with Marjie Skiff PA at 10:15 AM.   Address provided and daughter aware to bring all medications to appt.

## 2020-10-09 NOTE — Progress Notes (Addendum)
Cardiology Office Note:    Date:  10/12/2020   ID:  Mark Dickerson, DOB 11-02-1945, MRN [id number]  PCP:  Renaldo Reel, PA  Cardiologist:  Glenetta Hew, MD  Electrophysiologist:  None   Referring MD: Renaldo Reel, [place]   Chief Complaint: shortness of breath  History of Present Illness:    Mark Dickerson is a 75 y.o. male with a history of recent NSTEMI treated medically, severe PAD of bilaterally lower extremities with chronic limb threatening ischemia on the left s/p recent aortobifemoral bypass and left common femoral to below knee popliteal bypass, hypertension, hyperlipidemia, type 2 diabetes mellitus, and tobacco abuse who if followed by Dr. Ellyn Hack and presents today for further evaluation of shortness of breath.   Patient has a history of severe PAD and follows with Dr. Stanford Breed. He presented for peripheral angiogram was performed on 09/19/2020 and showed severe bilateral PAD. Most dural reconstruction felt to be aorto-bi-femoral bypass and left common femoral endarterectomy with left femoral to below knee popliteal bypass. He was admitted to accelerate work-up for rapidly progressing left foot gangrene. Cardiology was consulted for pre-op evaluation (never been seen by Cardiology prior to this). Echo was ordered and showed LVEF of 55-60% with normal wall motion and no significant valvular disease. He was felt to be at acceptable risk for surgery. Patient was taken to the OR on 09/24/2020 for the procedure. Post-op course also complicated by acute blood loss anemia requiring blood transfusion. He also developed chest pain on POD #2. High-sensitivity troponin peaked at 2,219. He was started on IV Heparin. Cardiac catheterization was recommended but patient declined given recent surgery. Therefore, he was treated medically with 48 hours of IV Heparin. He was discharged on DAPT (Aspirin and Plavix), Lopressor 67m twice daily, Imdur 359mdaily, Benazepril 2034maily. It does not look like he  was discharged on a statin but since then Lipitor 16m26mily started. Plan was for coronary CTA as outpatient.  Patient presents today for further evaluation of shortness of breath. Patient here with wife. He has been having intermittent shortness of breath over the last week. He sometimes have shortness of breath with minimal activity but it does not occur every time. Also has intermittent shortness of breath at rest. No orthopnea or PND though. He does have lower extremity edema, mostly in the left leg, but wife states this is actually greatly improved since discharge. No chest pain. No palpitations, lightheadedness, dizziness, syncope. He does just feel really fatigued though and "not right" in the head. No abnormal bleeding since discharge.  He was seen at his PCP's office today and BP was 100/50 so Amlodipine was discontinued. BP 82/40 in the office today. Relatively asymptomatic with this other than fatigue and shortness of breath.  Past Medical History:  Diagnosis Date  . Diabetes mellitus without complication (HCC)[place]. Hyperlipidemia   . Hypertension   . Tobacco abuse     Past Surgical History:  Procedure Laterality Date  . ABDOMINAL AORTOGRAM W/LOWER EXTREMITY N/A 09/19/2020   Procedure: ABDOMINAL AORTOGRAM W/LOWER EXTREMITY;  Surgeon: HawkCherre Robins;  [facility];  Service: Cardiovascular;  Laterality: N/A;  . AORTA - BILATERAL FEMORAL ARTERY BYPASS GRAFT N/A 09/24/2020   Procedure: AORTA BIFEMORAL BYPASS GRAFT;  Surgeon: HawkCherre Robins;  [facility];  Laterality: N/A;  . AORTIC ENDARTERECETOMY N/A 09/24/2020   Procedure: AORTIC ENDARTERECETOMY;  Surgeon: HawkCherre Robins;  [facility]  Service: Vascular;  Laterality: N/A;  . ENDARTERECTOMY FEMORAL Bilateral 09/24/2020   Procedure: ENDARTERECTOMY BILATERAL COMMON FEMORAL ARTERIES;  Surgeon: Cherre Robins, MD;  Location: [place];  Service: Vascular;  Laterality: Bilateral;  .  FEMORAL-POPLITEAL BYPASS GRAFT Left 09/24/2020   Procedure: LEFT FEMORAL ARTERY TO BELOW KNEE POPLITEAL ARTERY BY PASS GRAFT USING NON REVERSED SAPPENOUS VEIN.;  Surgeon: Cherre Robins, MD;  Location: [place];  Service: Vascular;  Laterality: Left;  . INTRAOPERATIVE ARTERIOGRAM Bilateral 09/24/2020   Procedure: INTRA OPERATIVE ARTERIOGRAM RIGHT AND LEFT LEG;  Surgeon: Cherre Robins, MD;  [facility];  Service: Vascular;  Laterality: Bilateral;  . IR ANGIOGRAM EXTREMITY BILATERAL  08/31/2020  . IR RADIOLOGIST EVAL & MGMT  08/14/2020  . IR US GUIDE VASC ACCESS LEFT  08/31/2020  . IR US GUIDE VASC ACCESS RIGHT  08/31/2020  . VEIN HARVEST Left 09/24/2020   Procedure: VEIN HARVEST LEFT GREATER SAPPHENOUS;  Surgeon: Cherre Robins, MD;  [facility];  Service: Vascular;  Laterality: Left;    Current Medications: Current Meds  Medication Sig  . acetaminophen (TYLENOL) 500 MG tablet Take 500 mg by mouth every 6 (six) hours as needed for moderate pain.  Marland Kitchen aspirin EC 81 MG tablet Take 1 tablet (81 mg total) by mouth daily. Swallow whole.  Marland Kitchen atorvastatin (LIPITOR) 40 MG tablet Take 1 tablet (40 mg total) by mouth daily.  . benazepril (LOTENSIN) 10 MG tablet Take 1 tablet (10 mg total) by mouth daily.  . cephALEXin (KEFLEX) 500 MG capsule Take 1 capsule (500 mg total) by mouth 2 (two) times daily.  . clopidogrel (PLAVIX) 75 MG tablet Take 1 tablet (75 mg total) by mouth daily.  . Coenzyme Q10 200 MG capsule Take 200 mg by mouth daily.  . fenofibrate (TRICOR) 145 MG tablet Take 145 mg by mouth daily.  . hydrochlorothiazide (HYDRODIURIL) 25 MG tablet Take 25 mg by mouth daily.  . isosorbide mononitrate (IMDUR) 30 MG 24 hr tablet Take 0.5 tablets (15 mg total) by mouth daily.  . metFORMIN (GLUCOPHAGE-XR) 500 MG 24 hr tablet Take 500 mg by mouth daily.  . metoprolol tartrate (LOPRESSOR) 50 MG tablet Take 1 tablet (50 mg total) by mouth 2 (two) times daily.  . nitroGLYCERIN (NITROSTAT) 0.4 MG SL  tablet Place 1 tablet (0.4 mg total) under the tongue every 5 (five) minutes as needed for chest pain.  Marland Kitchen oxyCODONE-acetaminophen (PERCOCET/ROXICET) 5-325 MG tablet Take 1-2 tablets by mouth every 4 (four) hours as needed for moderate pain.  . SYMBICORT 160-4.5 MCG/ACT inhaler Inhale 2 puffs into the lungs 2 (two) times daily as needed (shortness of breath).  . Vitamin D, Ergocalciferol, (DRISDOL) 1.25 MG (50000 UNIT) CAPS capsule Take 50,000 Units by mouth once a week.  . [DISCONTINUED] amLODipine (NORVASC) 10 MG tablet Take 10 mg by mouth daily.  . [DISCONTINUED] benazepril (LOTENSIN) 20 MG tablet Take 1 tablet (20 mg total) by mouth daily.  . [DISCONTINUED] isosorbide mononitrate (IMDUR) 30 MG 24 hr tablet Take 1 tablet (30 mg total) by mouth daily.     Allergies:   Patient has no known allergies.   Social History   Socioeconomic History  . Marital status: Single    Spouse name: Not on file  . Number of children: Not on file  . Years of education: Not on file  . Highest education level: Not on file  Occupational History  . Not on file  Tobacco Use  . Smoking status: Current Every  Day Smoker    Packs/day: 0.50    Types: Cigarettes  . Smokeless tobacco: Never Used  . Tobacco comment: started age 45, currently smoke  Vaping Use  . Vaping Use: Never used  Substance and Sexual Activity  . Alcohol use: Not Currently  . Drug use: Not on file  . Sexual activity: Not on file  Other Topics Concern  . Not on file  Social History Narrative  . Not on file   Social Determinants of Health   Financial Resource Strain: Not on file  Food Insecurity: Not on file  Transportation Needs: Not on file  Physical Activity: Not on file  Stress: Not on file  Social Connections: Not on file     Family History: The patient's family history includes Lung cancer in his father.  ROS:   Please see the history of present illness.     EKGs/Labs/Other Studies Reviewed:    The following studies  were reviewed today:  Echocardiogram 09/20/2020: Impressions: 1. Left ventricular ejection fraction, by estimation, is 55 to 60%. The  left ventricle has normal function. The left ventricle has no regional  wall motion abnormalities. Left ventricular diastolic function could not  be evaluated.  2. Right ventricular systolic function is normal. The right ventricular  size is normal. There is normal pulmonary artery systolic pressure.  3. The mitral valve is normal in structure. Trivial mitral valve  regurgitation. No evidence of mitral stenosis.  4. The aortic valve is tricuspid. Aortic valve regurgitation is not  visualized. No aortic stenosis is present.  5. The inferior vena cava is normal in size with greater than 50%  respiratory variability, suggesting right atrial pressure of 3 mmHg.   Comparison(s): No prior Echocardiogram.   Conclusion(s)/Recommendation(s): Normal biventricular function without  evidence of hemodynamically significant valvular heart disease.   EKG:  EKG ordered today. EKG personally reviewed and demonstrates normal sinus rhythm, rate 60 bpm, with 1st degree AV block and isolated biphasic T waves in leads III but no acute ST/T changes. Normal axis. QTc 408 ms.  Recent Labs: 09/25/2020: ALT 13; Magnesium 2.0 09/28/2020: BUN 16; Creatinine, Ser 1.13; Hemoglobin 9.4; Platelets 176; Potassium 3.9; Sodium 135  Recent Lipid Panel    Component Value Date/Time   CHOL 150 09/20/2020 0124   TRIG 154 (H) 09/20/2020 0124   HDL 43 09/20/2020 0124   CHOLHDL 3.5 09/20/2020 0124   VLDL 31 09/20/2020 0124   LDLCALC 76 09/20/2020 0124    Physical Exam:    Vital Signs: BP (!) 82/40   Pulse 60   Ht '5\' 11"'  (1.803 m)   Wt 195 lb (88.5 kg)   SpO2 98%   BMI 27.20 kg/m     Wt Readings from Last 3 Encounters:  10/12/20 195 lb (88.5 kg)  10/05/20 194 lb (88 kg)  09/27/20 197 lb 12 oz (89.7 kg)     General: 75 y.o. male in no acute distress. HEENT: Normocephalic  and atraumatic. Sclera clear.  Neck: Supple. No JVD. Heart: RRR. Distinct S1 and S2. No murmurs, gallops, or rubs. Radial pulses 2+ and equal bilaterally. Lungs: No increased work of breathing. Slightly diminshed breath sounds bilaterally. No wheezes, rhonchi, or rales.  Abdomen: Soft, non-distended, and non-tender to palpation.  Extremities: 1+ pitting edema of left lower extremity edema. Minimal edema on right lower extremeity edema.    Skin: Warm and dry. Neuro: Alert and oriented x3. No focal deficits. Psych: Normal affect. Responds appropriately.  Assessment:    1.  Shortness of breath   2. Recent NSTEMI   3. PAD (peripheral artery disease) ([place])   4. Hypotension, unspecified hypotension type   5. Hyperlipidemia, unspecified hyperlipidemia type   6. Type 2 diabetes mellitus with complication, without long-term current use of insulin (HCC)     Plan:    Shortness of Breath - Patient has had intermittent shortness of breath both at rest and activity over the last week after recent vascular surgery.  - O2 sats normal in the office today.  - Does not appear significantly volume overloaded one exam. Weight stable from discharge. - Will check chest x-ray and BNP today.  - Patient had CBC and CMET checked at PCP's office earlier this week so will request these labs.  - Will repeat Echo to assess for newly reduced EF or wall motion abnormalities after recent NSTEMI that was treated medically. Will ask for this to be done ASAP. - Low suspicion for PE at this point. Patient is not hypoxic, tachycardic, or tachypneic. However, if above work-up is unremarkable and dyspnea continues, can consider chest CTA.  Recent NSTEMI - History of recent NSTEMI following peripheral vascular surgery. High-sensitivity troponin peaked at 2,219. Cardiac catheterization was recommended but patient declined so he was treated medically.  - EKG today shows no acute ischemic changes. - No recurrent chest pain. -  Continue DAPT with Aspirin and Plavix. - Continue Lopressor 42m twice daily and Lipitor 459mdaily. - Will decrease dose of Benazepril and Imdur given hypotension. - Plan at discharge was for coronary CTA as outpatient. Will wait for Echo results because if EF is down or there is a new wall motion abnormality, cardiac cath would be preferable.  Severe PAD - History of severe PAD of bilateral lower extremities with chronic limb threatening ischemia on the left s/p recent aortobifemoral bypass and left common femoral to below knee popliteal bypass on 09/24/2020.  - Followed by Dr. HaStanford Breed Hypotensive - BP has been low recently PCP discontinued Amlodipine earlier this week. BP 82/40 in the office today but relatively asymptomatic with this. - Will decrease Benazepril to 1054maily and Imdur to 59m35mily.  - Continue HTCZ 25mg36mly and Lopressor 50mg 53me daily. - Patient will continue to keep a close eye of BP at home and notify us if [place] remains low.  Hyperlipidemia - Lipid panel on 09/20/2020: Total Cholesterol 150, Triglycerides 154, HDL 43, LDL 76. - LDL goal <70 given CAD. - Recently started on Lipitor 40mg d58m.  - Patient had labs checked at PCP's office earlier this week. Will request these labs.  Type 2 Diabetes Mellitus  - Management per PCP.  Disposition: Keep follow-up visit with [place] CColetta Memosext week on 10/19/2020.  ADDENDUM 10/23/2020 7:40AM: Received labs from PCP's office on 10/09/2020: - Lipid panel: Total Cholesterol 132, Triglycerides 151, HDL 48, LDL 58. - CMP: Na 133, K 4.5, Glucose 100, BUN 15, Cr 1.23, Albumin 4.1, AST 16, ALT 13, Alk Phos 72, Total Bili 0.4. - CBC: WBC 9.7, Hgb 9.8, Plts 375. - Hemoglobin A1c 6.4%.  No changes to above plan.   Medication Adjustments/Labs and Tests Ordered: Current medicines are reviewed at length with the patient today.  Concerns regarding medicines are outlined above.  Orders Placed This Encounter  Procedures  . DG  Chest 2 View  . Basic metabolic panel  . EKG 12-Lead  . ECHOCARDIOGRAM COMPLETE   Meds ordered this encounter  Medications  . benazepril (LOTENSIN) 10 MG tablet  Sig: Take 1 tablet (10 mg total) by mouth daily.    Dispense:  90 tablet    Refill:  1  . isosorbide mononitrate (IMDUR) 30 MG 24 hr tablet    Sig: Take 0.5 tablets (15 mg total) by mouth daily.    Dispense:  90 tablet    Refill:  3    Patient Instructions  Medication Instructions:  Decrease Imdur to 15 mg( 0.5 Tablet Daily) Decrease Benazepril to 10 mg (1 Tablet Daily) *If you need a refill on your cardiac medications before your next appointment, please call your pharmacy*   Lab Work: [facility] If you have labs (blood work) drawn today and your tests are completely normal, you will receive your results only by: Marland Kitchen MyChart Message (if you have MyChart) OR . A paper copy in the mail If you have any lab test that is abnormal or we need to change your treatment, we will call you to review the results.   Testing/Procedures: [address] A chest x-ray takes a picture of the organs and structures inside the chest, including the heart, lungs, and blood vessels. This test can show several things, including, whether the heart is enlarges; whether fluid is building up in the lungs; and whether pacemaker / defibrillator leads are still in place.  To be Done : [address] [place] has requested that you have an echocardiogram. Echocardiography is a painless test that uses sound waves to create images of your heart. It provides your doctor with information about the size and shape of your heart and how well your heart's chambers and valves are working. This procedure takes approximately one hour. There are no restrictions for this procedure.   Follow-Up: At [facility], you and your health needs are our priority.  As part of our continuing mission to provide you with exceptional heart care, we  have created designated Provider Care Teams.  These Care Teams include your primary Cardiologist (physician) and Advanced Practice Providers (APPs -  Physician Assistants and Nurse Practitioners) who all work together to provide you with the care you need, when you need it.  We recommend signing up for the patient portal called "MyChart".  Sign up information is provided on this After Visit Summary.  MyChart is used to connect with patients for Virtual Visits (Telemedicine).  Patients are able to view lab/test results, encounter notes, upcoming appointments, etc.  Non-urgent messages can be sent to your provider as well.   To learn more about what you can do with MyChart, go to [email or web address].    Your next appointment:   April 8th 2022  The format for your next appointment:   In Person  Provider:   Coletta Memos PA-C        Signed, Darreld Mclean, PA-C  10/12/2020 12:32 PM    [place]

## 2020-10-11 DIAGNOSIS — L97429 Non-pressure chronic ulcer of left heel and midfoot with unspecified severity: Secondary | ICD-10-CM | POA: Diagnosis not present

## 2020-10-11 DIAGNOSIS — I70244 Atherosclerosis of native arteries of left leg with ulceration of heel and midfoot: Secondary | ICD-10-CM | POA: Diagnosis not present

## 2020-10-11 DIAGNOSIS — L97528 Non-pressure chronic ulcer of other part of left foot with other specified severity: Secondary | ICD-10-CM | POA: Diagnosis not present

## 2020-10-11 DIAGNOSIS — R69 Illness, unspecified: Secondary | ICD-10-CM | POA: Diagnosis not present

## 2020-10-11 DIAGNOSIS — I70245 Atherosclerosis of native arteries of left leg with ulceration of other part of foot: Secondary | ICD-10-CM | POA: Diagnosis not present

## 2020-10-11 DIAGNOSIS — I10 Essential (primary) hypertension: Secondary | ICD-10-CM | POA: Diagnosis not present

## 2020-10-11 DIAGNOSIS — E785 Hyperlipidemia, unspecified: Secondary | ICD-10-CM | POA: Diagnosis not present

## 2020-10-11 DIAGNOSIS — Z48812 Encounter for surgical aftercare following surgery on the circulatory system: Secondary | ICD-10-CM | POA: Diagnosis not present

## 2020-10-11 DIAGNOSIS — Z7984 Long term (current) use of oral hypoglycemic drugs: Secondary | ICD-10-CM | POA: Diagnosis not present

## 2020-10-11 DIAGNOSIS — E1151 Type 2 diabetes mellitus with diabetic peripheral angiopathy without gangrene: Secondary | ICD-10-CM | POA: Diagnosis not present

## 2020-10-12 ENCOUNTER — Ambulatory Visit: Payer: Medicare HMO | Admitting: Student

## 2020-10-12 ENCOUNTER — Encounter: Payer: Self-pay | Admitting: Student

## 2020-10-12 ENCOUNTER — Other Ambulatory Visit: Payer: Self-pay | Admitting: Physician Assistant

## 2020-10-12 ENCOUNTER — Ambulatory Visit
Admission: RE | Admit: 2020-10-12 | Discharge: 2020-10-12 | Disposition: A | Discharge status: Home / Self Care | Payer: Medicare HMO | Source: Ambulatory Visit | Attending: Student | Admitting: Student

## 2020-10-12 ENCOUNTER — Other Ambulatory Visit: Payer: Self-pay

## 2020-10-12 ENCOUNTER — Telehealth: Payer: Self-pay

## 2020-10-12 VITALS — BP 82/40 | HR 60 | Ht 71.0 in | Wt 195.0 lb

## 2020-10-12 DIAGNOSIS — I739 Peripheral vascular disease, unspecified: Secondary | ICD-10-CM

## 2020-10-12 DIAGNOSIS — I252 Old myocardial infarction: Secondary | ICD-10-CM

## 2020-10-12 DIAGNOSIS — R0602 Shortness of breath: Secondary | ICD-10-CM

## 2020-10-12 DIAGNOSIS — E118 Type 2 diabetes mellitus with unspecified complications: Secondary | ICD-10-CM | POA: Insufficient documentation

## 2020-10-12 DIAGNOSIS — I10 Essential (primary) hypertension: Secondary | ICD-10-CM | POA: Insufficient documentation

## 2020-10-12 DIAGNOSIS — E785 Hyperlipidemia, unspecified: Secondary | ICD-10-CM | POA: Diagnosis not present

## 2020-10-12 DIAGNOSIS — I214 Non-ST elevation (NSTEMI) myocardial infarction: Secondary | ICD-10-CM | POA: Diagnosis not present

## 2020-10-12 DIAGNOSIS — I70262 Atherosclerosis of native arteries of extremities with gangrene, left leg: Secondary | ICD-10-CM

## 2020-10-12 DIAGNOSIS — I959 Hypotension, unspecified: Secondary | ICD-10-CM

## 2020-10-12 LAB — BASIC METABOLIC PANEL
BUN/Creatinine Ratio: 12 (ref 10–24)
BUN: 15 mg/dL (ref 8–27)
CO2: 23 mmol/L (ref 20–29)
Calcium: 9.9 mg/dL (ref 8.6–10.2)
Chloride: 96 mmol/L (ref 96–106)
Creatinine, Ser: 1.26 mg/dL (ref 0.76–1.27)
Glucose: 112 mg/dL — ABNORMAL HIGH (ref 65–99)
Potassium: 4.6 mmol/L (ref 3.5–5.2)
Sodium: 134 mmol/L (ref 134–144)
eGFR: 60 mL/min/{1.73_m2} (ref >59)

## 2020-10-12 MED ORDER — ISOSORBIDE MONONITRATE ER 30 MG PO TB24: 15.0000 mg | ORAL_TABLET | Freq: Every day | ORAL | 3 refills | Status: DC

## 2020-10-12 MED ORDER — OXYCODONE-ACETAMINOPHEN 5-325 MG PO TABS: 1.0000 | ORAL_TABLET | Freq: Four times a day (QID) | ORAL | 0 refills | Status: DC | PRN

## 2020-10-12 MED ORDER — BENAZEPRIL HCL 10 MG PO TABS: 10.0000 mg | ORAL_TABLET | Freq: Every day | ORAL | 1 refills | Status: DC

## 2020-10-12 NOTE — Telephone Encounter (Signed)
Rivka Barbara, pt's daughter left message letting us know pt has 3 pain pills left and is taking them infrequently, just when needed but does not want him to run out over the weekend and before he is back in office for f/u next Tuesday. APP has reordered rx and he will f/u next week with MD.

## 2020-10-12 NOTE — Patient Instructions (Signed)
Medication Instructions:  Decrease Imdur to 15 mg( 0.5 Tablet Daily) Decrease Benazepril to 10 mg (1 Tablet Daily) *If you need a refill on your cardiac medications before your next appointment, please call your pharmacy*   Lab Work: [facility] If you have labs (blood work) drawn today and your tests are completely normal, you will receive your results only by: Marland Kitchen MyChart Message (if you have MyChart) OR . A paper copy in the mail If you have any lab test that is abnormal or we need to change your treatment, we will call you to review the results.   Testing/Procedures: [address] A chest x-ray takes a picture of the organs and structures inside the chest, including the heart, lungs, and blood vessels. This test can show several things, including, whether the heart is enlarges; whether fluid is building up in the lungs; and whether pacemaker / defibrillator leads are still in place.  To be Done : [address] Your physician has requested that you have an echocardiogram. Echocardiography is a painless test that uses sound waves to create images of your heart. It provides your doctor with information about the size and shape of your heart and how well your heart's chambers and valves are working. This procedure takes approximately one hour. There are no restrictions for this procedure.   Follow-Up: At [facility], you and your health needs are our priority.  As part of our continuing mission to provide you with exceptional heart care, we have created designated Provider Care Teams.  These Care Teams include your primary Cardiologist (physician) and Advanced Practice Providers (APPs -  Physician Assistants and Nurse Practitioners) who all work together to provide you with the care you need, when you need it.  We recommend signing up for the patient portal called "MyChart".  Sign up information is provided on this After Visit Summary.  MyChart is used to connect with  patients for Virtual Visits (Telemedicine).  Patients are able to view lab/test results, encounter notes, upcoming appointments, etc.  Non-urgent messages can be sent to your provider as well.   To learn more about what you can do with MyChart, go to [email or web address].    Your next appointment:   April 8th 2022  The format for your next appointment:   In Person  Provider:   Edd Fabian PA-C

## 2020-10-12 NOTE — Addendum Note (Signed)
Addended by: Myna Hidalgo A on: 10/12/2020 01:29 PM   Modules accepted: Orders

## 2020-10-16 ENCOUNTER — Ambulatory Visit (INDEPENDENT_AMBULATORY_CARE_PROVIDER_SITE_OTHER)
Admission: RE | Admit: 2020-10-16 | Discharge: 2020-10-16 | Disposition: A | Discharge status: Home / Self Care | Payer: Medicare HMO | Source: Ambulatory Visit | Attending: Vascular Surgery | Admitting: Vascular Surgery

## 2020-10-16 ENCOUNTER — Encounter: Payer: Self-pay | Admitting: Vascular Surgery

## 2020-10-16 ENCOUNTER — Other Ambulatory Visit: Payer: Self-pay

## 2020-10-16 ENCOUNTER — Ambulatory Visit (HOSPITAL_COMMUNITY)
Admission: RE | Admit: 2020-10-16 | Discharge: 2020-10-16 | Disposition: A | Discharge status: Home / Self Care | Payer: Medicare HMO | Source: Ambulatory Visit | Attending: Vascular Surgery | Admitting: Vascular Surgery

## 2020-10-16 ENCOUNTER — Ambulatory Visit (INDEPENDENT_AMBULATORY_CARE_PROVIDER_SITE_OTHER): Payer: Medicare HMO | Admitting: Vascular Surgery

## 2020-10-16 VITALS — BP 107/60 | HR 79 | Temp 97.9°F | Resp 20 | Ht 71.0 in | Wt 195.0 lb

## 2020-10-16 DIAGNOSIS — Z9889 Other specified postprocedural states: Secondary | ICD-10-CM

## 2020-10-16 DIAGNOSIS — I70262 Atherosclerosis of native arteries of extremities with gangrene, left leg: Secondary | ICD-10-CM | POA: Insufficient documentation

## 2020-10-16 MED ORDER — SANTYL 250 UNIT/GM EX OINT: 1.0000 "application " | TOPICAL_OINTMENT | Freq: Every day | CUTANEOUS | 3 refills | Status: DC

## 2020-10-16 NOTE — Progress Notes (Signed)
[facility] [place] PROGRESS NOTE  ASSESSMENT / PLAN: Mark Dickerson is a 75 y.o. male status post aortobifemoral bypass and left femoral to below-knee popliteal artery bypass on 09/24/20.  He is making slow progress.  His left thigh saphenectomy site unfortunately appears to be partially dehisced.  I suspect this will heal without further intervention.  I have prescribed Santyl to help clean up the wound.  We will see him back in 1 to 2 weeks.  If this is not improved at that time we will discuss operative debridement and wound VAC therapy.  SUBJECTIVE: Continues to improve.  His left foot appears to be healing.  The gangrene about his great toe is resolving.  The gangrene about the third toe appears stable.  The gangrene about his heel appears to be improving.  Previous erythema about the thigh has resolved.  Unfortunately he has developed some dehiscence of one of the saphenectomy sites.  OBJECTIVE: BP 107/60 (BP Location: Left Arm, Patient Position: Sitting, Cuff Size: Normal)   Pulse 79   Temp 97.9 F (36.6 C)   Resp 20   Ht 5\' 11"  (1.803 m)   Wt 88.5 kg   SpO2 100%   BMI 27.20 kg/m   Superficial dehiscence of the left distal thigh saphenectomy site Appearance overall of the gangrene of the left foot No more cellulitis about the incisions  CBC Latest Ref Rng & Units 09/28/2020 09/27/2020 09/27/2020  WBC 4.0 - 10.5 K/uL 8.7 8.8 8.7  Hemoglobin 13.0 - 17.0 g/dL 09/29/2020) 9.6(P) 7.1(L)  Hematocrit 39.0 - 52.0 % 28.4(L) 27.9(L) 20.9(L)  Platelets 150 - 400 K/uL 176 158 161     CMP Latest Ref Rng & Units 10/12/2020 09/28/2020 09/27/2020  Glucose 65 - 99 mg/dL 09/29/2020) 638(G) 665(L)  BUN 8 - 27 mg/dL 15 16 13   Creatinine 0.76 - 1.27 mg/dL 935(T 0.17  Sodium 134 - 144 mmol/L 134 135 137  Potassium 3.5 - 5.2 mmol/L 4.6 3.9 3.6  Chloride 96 - 106 mmol/L 96 102 106  CO2 20 - 29 mmol/L 23 23 22   Calcium 8.6 - 10.2 mg/dL 9.9 7.93) 9.03)  Total Protein 6.5 - 8.1  g/dL - - -  Total Bilirubin 0.3 - 1.2 mg/dL - - -  Alkaline Phos 38 - 126 U/L - - -  AST 15 - 41 U/L - - -  ALT 0 - 44 U/L - - -    Estimated Creatinine Clearance: 54 mL/min (by C-G formula based on SCr of 1.26 mg/dL).  LOWER EXTREMITY DOPPLER STUDY   Indications: Claudication, ulceration, gangrene, peripheral artery  disease, and        Claudicant with left 3rd digit toenail bed eschar after  removal,        non-healing. Severe aortoiliac atherosclerotic disease.        Unsuccessful attempt at kissing iliac stent placement on  08/31/20 in        IR due to inability to catheterize left CIA.   High Risk Factors: Hypertension, hyperlipidemia, Diabetes, current smoker.    Vascular Interventions: Aorto-bifemoral bypass graft and left fem to  below knee             bypass graft 09/24/2020.   Performing Technologist: 2.3(R RVT     Examination Guidelines: A complete evaluation includes at minimum, Doppler  waveform signals and systolic blood pressure reading at the level of  bilateral  brachial, anterior tibial, and posterior tibial arteries, when vessel  segments  are accessible. Bilateral testing is considered an integral part of a  complete  examination. Photoelectric Plethysmograph (PPG) waveforms and toe systolic  pressure readings are included as required and additional duplex testing  as  needed. Limited examinations for reoccurring indications may be performed  as  noted.     ABI Findings:  +---------+------------------+-----+----------+--------+  Right  Rt Pressure (mmHg)IndexWaveform Comment   +---------+------------------+-----+----------+--------+  Brachial 119                      +---------+------------------+-----+----------+--------+  ATA   71        0.60            +---------+------------------+-----+----------+--------+  PTA   69         0.58 monophasic      +---------+------------------+-----+----------+--------+  DP                monophasic      +---------+------------------+-----+----------+--------+  Great Toe59        0.50            +---------+------------------+-----+----------+--------+   +---------+------------------+-----+----------+-------+  Left   Lt Pressure (mmHg)IndexWaveform Comment  +---------+------------------+-----+----------+-------+  Brachial 118                      +---------+------------------+-----+----------+-------+  ATA   102        0.86            +---------+------------------+-----+----------+-------+  PTA   125        1.05 monophasic      +---------+------------------+-----+----------+-------+  DP                monophasic      +---------+------------------+-----+----------+-------+  Great Toe27        0.23            +---------+------------------+-----+----------+-------+   +-------+-----------+-----------+------------+------------+  ABI/TBIToday's ABIToday's TBIPrevious ABIPrevious TBI  +-------+-----------+-----------+------------+------------+  Right 0.60    0.50    0.50    0.31      +-------+-----------+-----------+------------+------------+  Left  1.05    0.23    0.35    0        +-------+-----------+-----------+------------+------------+       Bilateral ABIs appear increased compared to prior study on 09/18/2020.    Summary:  Right: Resting right ankle-brachial index indicates moderate right lower  extremity arterial disease. The right toe-brachial index is abnormal. RT  great toe pressure = 59 mmHg.   Left: Resting left ankle-brachial index is within normal range. No  evidence of significant left lower extremity arterial disease. The left   toe-brachial index is abnormal. LT Great toe pressure = 27 mmHg.  Although ankle brachial indices are within normal limits (0.95-1.29),  arterial Doppler waveforms at the ankle suggest some component of arterial  occlusive disease.     *See table(s) above for measurements and observations.      Electronically signed by Heath Lark on 10/16/2020 at 4:18:05 PM.    Rande Brunt. Lenell Antu, MD [facility] Phone Number: [phone] 10/16/2020 4:21 PM

## 2020-10-17 DIAGNOSIS — I739 Peripheral vascular disease, unspecified: Secondary | ICD-10-CM | POA: Diagnosis not present

## 2020-10-17 DIAGNOSIS — I10 Essential (primary) hypertension: Secondary | ICD-10-CM | POA: Diagnosis not present

## 2020-10-17 DIAGNOSIS — R0602 Shortness of breath: Secondary | ICD-10-CM | POA: Diagnosis not present

## 2020-10-17 DIAGNOSIS — I252 Old myocardial infarction: Secondary | ICD-10-CM | POA: Diagnosis not present

## 2020-10-17 DIAGNOSIS — E785 Hyperlipidemia, unspecified: Secondary | ICD-10-CM | POA: Diagnosis not present

## 2020-10-17 DIAGNOSIS — E118 Type 2 diabetes mellitus with unspecified complications: Secondary | ICD-10-CM | POA: Diagnosis not present

## 2020-10-17 DIAGNOSIS — I959 Hypotension, unspecified: Secondary | ICD-10-CM | POA: Diagnosis not present

## 2020-10-17 DIAGNOSIS — I214 Non-ST elevation (NSTEMI) myocardial infarction: Secondary | ICD-10-CM | POA: Diagnosis not present

## 2020-10-18 DIAGNOSIS — E785 Hyperlipidemia, unspecified: Secondary | ICD-10-CM | POA: Diagnosis not present

## 2020-10-18 DIAGNOSIS — I70244 Atherosclerosis of native arteries of left leg with ulceration of heel and midfoot: Secondary | ICD-10-CM | POA: Diagnosis not present

## 2020-10-18 DIAGNOSIS — Z7984 Long term (current) use of oral hypoglycemic drugs: Secondary | ICD-10-CM | POA: Diagnosis not present

## 2020-10-18 DIAGNOSIS — E1151 Type 2 diabetes mellitus with diabetic peripheral angiopathy without gangrene: Secondary | ICD-10-CM | POA: Diagnosis not present

## 2020-10-18 DIAGNOSIS — Z48812 Encounter for surgical aftercare following surgery on the circulatory system: Secondary | ICD-10-CM | POA: Diagnosis not present

## 2020-10-18 DIAGNOSIS — I70245 Atherosclerosis of native arteries of left leg with ulceration of other part of foot: Secondary | ICD-10-CM | POA: Diagnosis not present

## 2020-10-18 DIAGNOSIS — L97429 Non-pressure chronic ulcer of left heel and midfoot with unspecified severity: Secondary | ICD-10-CM | POA: Diagnosis not present

## 2020-10-18 DIAGNOSIS — L97528 Non-pressure chronic ulcer of other part of left foot with other specified severity: Secondary | ICD-10-CM | POA: Diagnosis not present

## 2020-10-18 DIAGNOSIS — R69 Illness, unspecified: Secondary | ICD-10-CM | POA: Diagnosis not present

## 2020-10-18 DIAGNOSIS — I10 Essential (primary) hypertension: Secondary | ICD-10-CM | POA: Diagnosis not present

## 2020-10-18 LAB — PRO B NATRIURETIC PEPTIDE: NT-Pro BNP: 549 pg/mL — ABNORMAL HIGH (ref 0–486)

## 2020-10-18 NOTE — Progress Notes (Signed)
Cardiology Clinic Note   Patient Name: Mark Dickerson Date of Encounter: 10/19/2020  Primary Care Provider:  Marlyn Corporal, PA Primary Cardiologist:  Mark Lemma, MD  Patient Profile    Mark Dickerson 75 year old male presents the clinic today for follow-up evaluation of his increased shortness of breath.  Past Medical History    Past Medical History:  Diagnosis Date  . Diabetes mellitus without complication (HCC)   . Hyperlipidemia   . Hypertension   . Tobacco abuse    Past Surgical History:  Procedure Laterality Date  . ABDOMINAL AORTOGRAM W/LOWER EXTREMITY N/A 09/19/2020   Procedure: ABDOMINAL AORTOGRAM W/LOWER EXTREMITY;  Surgeon: Mark Douglas, MD;  Location: [facility] INVASIVE CV LAB;  Service: Cardiovascular;  Laterality: N/A;  . AORTA - BILATERAL FEMORAL ARTERY BYPASS GRAFT N/A 09/24/2020   Procedure: AORTA BIFEMORAL BYPASS GRAFT;  Surgeon: Mark Douglas, MD;  Location[facility] OR;  Service: Vascular;  Laterality: N/A;  . AORTIC ENDARTERECETOMY N/A 09/24/2020   Procedure: AORTIC ENDARTERECETOMY;  Surgeon: Mark Douglas, MD;  Location: [facility] OR;  Service: Vascular;  Laterality: N/A;  . ENDARTERECTOMY FEMORAL Bilateral 09/24/2020   Procedure: ENDARTERECTOMY BILATERAL COMMON FEMORAL ARTERIES;  Surgeon: Mark Douglas, MD;  Location: [facility] OR;  Service: Vascular;  Laterality: Bilateral;  . FEMORAL-POPLITEAL BYPASS GRAFT Left 09/24/2020   Procedure: LEFT FEMORAL ARTERY TO BELOW KNEE POPLITEAL ARTERY BY PASS GRAFT USING NON REVERSED SAPPENOUS VEIN.;  Surgeon: Mark Douglas, MD;  Location: [facility] OR;  Service: Vascular;  Laterality: Left;  . INTRAOPERATIVE ARTERIOGRAM Bilateral 09/24/2020   Procedure: INTRA OPERATIVE ARTERIOGRAM RIGHT AND LEFT LEG;  Surgeon: Mark Douglas, MD;  Location: [facility] OR;  Service: Vascular;  Laterality: Bilateral;  . IR ANGIOGRAM EXTREMITY BILATERAL  08/31/2020  . IR RADIOLOGIST EVAL & MGMT  08/14/2020  . IR US GUIDE VASC ACCESS LEFT  08/31/2020  . IR US GUIDE  VASC ACCESS RIGHT  08/31/2020  . VEIN HARVEST Left 09/24/2020   Procedure: VEIN HARVEST LEFT GREATER SAPPHENOUS;  Surgeon: Mark Douglas, MD;  Location: [facility] OR;  Service: Vascular;  Laterality: Left;    Allergies  No Known Allergies  History of Present Illness    [name] has a PMH of NSTEMI with medical management recommended, severe PAD bilateral lower extremities, HTN, HLD, type 2 diabetes, and tobacco abuse.  He underwent recent left aortobifem bypass and left common femoral popliteal bypass.  He follows with Dr. Lenell Dickerson for his PAD.  He underwent peripheral angiogram 09/19/2020 which showed severe bilateral PAD.  He then underwent left aortobifem bypass and left common femoral popliteal bypass.  He had to be admitted for accelerated work-up for rapidly progressing left foot gangrene.  Cardiology was then consulted for preoperative evaluation.  His echocardiogram showed an LVEF of 55-60% and no significant valvular abnormalities.  He was felt to be acceptable for surgery.  He was taken to the operating room for the procedure 09/24/2020.  His postoperative course was complicated by acute blood loss anemia requiring transfusion.  He also developed chest pain on postop day 2.  His high-sensitivity troponins peaked at 2219.  He was started on IV heparin.  Cardiac catheterization was recommended however, patient declined due to his recent surgery.  He was discharged on dual antiplatelet therapy aspirin and Plavix, Lopressor, Imdur, Benzapril and Lipitor.  Plan for coronary CTA as outpatient was recommended.  He was seen by Mark Skiff, PA-C on 10/12/2020.  During that time he reported increased shortness of  breath.  He presented with his wife.  He complained of intermittent episodes of shortness of breath over the prior week.  He indicated being sore at times with minimal activity but not consistently.  He denied orthopnea and PND.  He was noted to have lower extremity edema in his left leg which was  gradually improving.  He denied chest pain, palpitations, lightheadedness, dizziness, and syncope.  He reported feeling fairly fatigued and"not right".  He denied bleeding issues.  He had also been seen at his PCP office and his blood pressure was 100/50.  His amlodipine was discontinued and his blood pressure in the clinic was 82/40.   He presents the clinic today for follow-up evaluation states he is feeling much better and is having much less trouble with his breathing.  His blood pressures have been in the 100s-110s over 50s.  His energy level has also improved.  He denies dizziness and presyncope.  His left leg incision continues to heal well.  I have encouraged him to get a hand mirror so that he may evaluate the bottom of his feet daily.  I will have him continue to monitor his blood pressure.  He stopped his morning dose of metoprolol tartrate.  I will prescribe 12.5 mg of metoprolol succinate in the evening, have him continue to increase physical activity, discontinue follow-up echocardiogram, and have him follow-up in 3 months.  He wishes to defer his coronary CTA at this time and denies chest pain at rest and with increased physical activity.  Today he denies chest pain, shortness of breath, lower extremity edema, fatigue, palpitations, melena, hematuria, hemoptysis, diaphoresis, weakness, presyncope, syncope, orthopnea, and PND.   Home Medications    Prior to Admission medications   Medication Sig Start Date End Date Taking? Authorizing Provider  acetaminophen (TYLENOL) 500 MG tablet Take 500 mg by mouth every 6 (six) hours as needed for moderate pain.    [provider]  aspirin EC 81 MG tablet Take 1 tablet (81 mg total) by mouth daily. Swallow whole. 09/28/20 09/28/21  Baglia, Corrina, PA-C  atorvastatin (LIPITOR) 40 MG tablet Take 1 tablet (40 mg total) by mouth daily. 10/03/20 01/01/21  Ronney Asters, NP  benazepril (LOTENSIN) 10 MG tablet Take 1 tablet (10 mg total) by mouth  daily. 10/12/20   Mark Dickerson E, PA-C  cephALEXin (KEFLEX) 500 MG capsule Take 1 capsule (500 mg total) by mouth 2 (two) times daily. 10/05/20   Rhyne, Ames Coupe, PA-C  clopidogrel (PLAVIX) 75 MG tablet Take 1 tablet (75 mg total) by mouth daily. 09/28/20 09/28/21  Baglia, Corrina, PA-C  Coenzyme Q10 200 MG capsule Take 200 mg by mouth daily.    [provider]  collagenase (SANTYL) ointment Apply 1 application topically daily. 10/16/20   Mark Douglas, MD  fenofibrate (TRICOR) 145 MG tablet Take 145 mg by mouth daily. 07/11/20   [provider]  hydrochlorothiazide (HYDRODIURIL) 25 MG tablet Take 25 mg by mouth daily. 05/15/20   [provider]  isosorbide mononitrate (IMDUR) 30 MG 24 hr tablet Take 0.5 tablets (15 mg total) by mouth daily. 10/12/20 01/10/21  Corrin Parker, PA-C  metFORMIN (GLUCOPHAGE-XR) 500 MG 24 hr tablet Take 500 mg by mouth daily. 05/17/20   [provider]  metoprolol tartrate (LOPRESSOR) 50 MG tablet Take 1 tablet (50 mg total) by mouth 2 (two) times daily. 09/28/20   Baglia, Corrina, PA-C  nitroGLYCERIN (NITROSTAT) 0.4 MG SL tablet Place 1 tablet (0.4 mg total)  under the tongue every 5 (five) minutes as needed for chest pain. 09/28/20   Baglia, Corrina, PA-C  oxyCODONE-acetaminophen (PERCOCET/ROXICET) 5-325 MG tablet Take 1-2 tablets by mouth every 4 (four) hours as needed for moderate pain. 09/28/20   Baglia, Corrina, PA-C  SYMBICORT 160-4.5 MCG/ACT inhaler Inhale 2 puffs into the lungs 2 (two) times daily as needed (shortness of breath). 08/01/20   [provider]  Vitamin D, Ergocalciferol, (DRISDOL) 1.25 MG (50000 UNIT) CAPS capsule Take 50,000 Units by mouth once a week. 08/01/20   [provider]    Family History    Family History  Problem Relation Age of Onset  . Lung cancer Father    He indicated that his mother is deceased. He indicated that his father is deceased.  Social History    Social History    Socioeconomic History  . Marital status: Single    Spouse name: Not on file  . Number of children: Not on file  . Years of education: Not on file  . Highest education level: Not on file  Occupational History  . Not on file  Tobacco Use  . Smoking status: Current Every Day Smoker    Packs/day: 0.50    Types: Cigarettes  . Smokeless tobacco: Never Used  . Tobacco comment: started age 59, currently smoke  Vaping Use  . Vaping Use: Never used  Substance and Sexual Activity  . Alcohol use: Not Currently  . Drug use: Not on file  . Sexual activity: Not on file  Other Topics Concern  . Not on file  Social History Narrative  . Not on file   Social Determinants of Health   Financial Resource Strain: Not on file  Food Insecurity: Not on file  Transportation Needs: Not on file  Physical Activity: Not on file  Stress: Not on file  Social Connections: Not on file  Intimate Partner Violence: Not on file     Review of Systems    General:  No chills, fever, night sweats or weight changes.  Cardiovascular:  No chest pain, dyspnea on exertion, edema, orthopnea, palpitations, paroxysmal nocturnal dyspnea. Dermatological: No rash, lesions/masses Respiratory: No cough, dyspnea Urologic: No hematuria, dysuria Abdominal:   No nausea, vomiting, diarrhea, bright red blood per rectum, melena, or hematemesis Neurologic:  No visual changes, wkns, changes in mental status. All other systems reviewed and are otherwise negative except as noted above.  Physical Exam    VS:  BP (!) 110/58   Pulse 66   Ht 6' (1.829 m)   Wt 181 lb 6.4 oz (82.3 kg)   SpO2 100%   BMI 24.60 kg/m  , BMI Body mass index is 24.6 kg/m. GEN: Well nourished, well developed, in no acute distress. HEENT: normal. Neck: Supple, no JVD, carotid bruits, or masses. Cardiac: RRR, no murmurs, rubs, or gallops. No clubbing, cyanosis, edema.  Radials/DP/PT 2+ and equal bilaterally.  Respiratory:  Respirations regular and  unlabored, clear to auscultation bilaterally. GI: Soft, nontender, nondistended, BS + x 4. MS: no deformity or atrophy. Skin: warm and dry, no rash.  Medial left leg incision at mid thigh has slight drainage.  Distal and proximal portions of the incision are healing well.  No erythema. Neuro:  Strength and sensation are intact. Psych: Normal affect.  Accessory Clinical Findings    Recent Labs: 09/25/2020: ALT 13; Magnesium 2.0 09/28/2020: Hemoglobin 9.4; Platelets 176 10/12/2020: BUN 15; Creatinine, Ser 1.26; Potassium 4.6; Sodium 134 10/17/2020: NT-Pro BNP 549   Recent Lipid  Panel    Component Value Date/Time   CHOL 150 09/20/2020 0124   TRIG 154 (H) 09/20/2020 0124   HDL 43 09/20/2020 0124   CHOLHDL 3.5 09/20/2020 0124   VLDL 31 09/20/2020 0124   LDLCALC 76 09/20/2020 0124    ECG personally reviewed by me today-none today.  EKG 10/12/2020 Normal sinus rhythm first-degree AV block biphasic T waves and leads II, no acute ST or T wave changes 60 bpm  Echocardiogram 09/20/2020 Impressions: 1. Left ventricular ejection fraction, by estimation, is 55 to 60%. The  left ventricle has normal function. The left ventricle has no regional  wall motion abnormalities. Left ventricular diastolic function could not  be evaluated.  2. Right ventricular systolic function is normal. The right ventricular  size is normal. There is normal pulmonary artery systolic pressure.  3. The mitral valve is normal in structure. Trivial mitral valve  regurgitation. No evidence of mitral stenosis.  4. The aortic valve is tricuspid. Aortic valve regurgitation is not  visualized. No aortic stenosis is present.  5. The inferior vena cava is normal in size with greater than 50%  respiratory variability, suggesting right atrial pressure of 3 mmHg.   Comparison(s): No prior Echocardiogram.   Conclusion(s)/Recommendation(s): Normal biventricular function without  evidence of hemodynamically significant  valvular heart disease.  Assessment & Plan   1.  Shortness of breath-breathing  to baseline.  Relief in symptoms with normalization of blood pressure.  Previously low suspicion for PE.  O2 saturation previously normal on room air.  CXR showed no acute abnormalities and overall improved aeration when compared with prior study.  BNP showed elevation at 549.  Discontinue follow-up echocardiogram. Continue to monitor.  Hypotension-BP today 110/58.  Blood pressure recovered with discontinuation of amlodipine. Continue metoprolol, Imdur, Benzapril, HCTZ Heart healthy low-sodium diet-salty 6 given Increase physical activity as tolerated  Coronary artery disease-no chest pain today.  NSTEMI following peripheral vascular surgery.  High-sensitivity troponins peaked at 2219.  Cardiac catheterization deferred per patient request and medical management initiated. Continue aspirin, Plavix, metoprolol, Imdur, benazepril Heart healthy low-sodium diet-salty 6 given Increase physical activity as tolerated Wishes to defer coronary CTA at this time  Hyperlipidemia-09/20/2020: Cholesterol 150; HDL 43; LDL Cholesterol 76; Triglycerides 154; VLDL 31 Continue atorvastatin Heart healthy low-sodium high-fiber diet increase physical activity as tolerated  Peripheral arterial disease-denies claudication.  Underwent left aortofemoral bypass and left common femoral popliteal bypass in the setting of critical limb ischemia 09/24/2020 Follows with Dr. Lenell AntuHawken  Disposition: Follow-up with Dr. Herbie BaltimoreHarding in 3 months.   Thomasene RippleJesse M. Johnathan Heskett NP-C    10/19/2020, 3:38 PM [facility] [address] Office [phone] Fax [phone]  Notice: This dictation was prepared with Dragon dictation along with smaller phrase technology. Any transcriptional errors that result from this process are unintentional and may not be corrected upon review.  I spent 15 minutes examining this patient,  reviewing medications, and using patient centered shared decision making involving her cardiac care.  Prior to her visit I spent greater than 20 minutes reviewing her past medical history,  medications, and prior cardiac tests.

## 2020-10-19 ENCOUNTER — Ambulatory Visit: Payer: Medicare HMO | Admitting: General Practice

## 2020-10-19 ENCOUNTER — Encounter: Payer: Self-pay | Admitting: General Practice

## 2020-10-19 ENCOUNTER — Other Ambulatory Visit: Payer: Self-pay

## 2020-10-19 VITALS — BP 110/58 | HR 66 | Ht 72.0 in | Wt 181.4 lb

## 2020-10-19 DIAGNOSIS — I739 Peripheral vascular disease, unspecified: Secondary | ICD-10-CM

## 2020-10-19 DIAGNOSIS — R0602 Shortness of breath: Secondary | ICD-10-CM

## 2020-10-19 DIAGNOSIS — I252 Old myocardial infarction: Secondary | ICD-10-CM

## 2020-10-19 DIAGNOSIS — E785 Hyperlipidemia, unspecified: Secondary | ICD-10-CM | POA: Diagnosis not present

## 2020-10-19 DIAGNOSIS — I959 Hypotension, unspecified: Secondary | ICD-10-CM | POA: Diagnosis not present

## 2020-10-19 MED ORDER — METOPROLOL SUCCINATE ER 25 MG PO TB24: 25.0000 mg | ORAL_TABLET | Freq: Every day | ORAL | 6 refills | Status: DC

## 2020-10-19 MED ORDER — METOPROLOL SUCCINATE ER 25 MG PO TB24: 12.5000 mg | ORAL_TABLET | Freq: Every day | ORAL | 3 refills | Status: DC

## 2020-10-19 NOTE — Addendum Note (Signed)
Addended by: Alyson Ingles on: 10/19/2020 04:41 PM   Modules accepted: Orders

## 2020-10-19 NOTE — Patient Instructions (Signed)
Medication Instructions:  STOP METOPROLOL TARTRATE  START METOPROLOL SUCCINATE 12.25MG  IN THE EVENING *If you need a refill on your cardiac medications before your next appointment, please call your pharmacy*  Special Instructions CONTINUE TO LOG YOUR BLOOD PRESSURE  PLEASE INCREASE PHYSICAL ACTIVITY AS TOLERATED  MAKE SURE TO BUY A HAND MIRROR TO INSPECT THE BOTTOM OF YOUR FEEL AND BETWEEN YOUR TOES  Follow-Up: Your next appointment:  3 month(s) In Person with Bryan Lemma, MD OR IF UNAVAILABLE JESSE CLEAVER, FNP-C  At [facility], you and your health needs are our priority.  As part of our continuing mission to provide you with exceptional heart care, we have created designated Provider Care Teams.  These Care Teams include your primary Cardiologist (physician) and Advanced Practice Providers (APPs -  Physician Assistants and Nurse Practitioners) who all work together to provide you with the care you need, when you need it.  We recommend signing up for the patient portal called "MyChart".  Sign up information is provided on this After Visit Summary.  MyChart is used to connect with patients for Virtual Visits (Telemedicine).  Patients are able to view lab/test results, encounter notes, upcoming appointments, etc.  Non-urgent messages can be sent to your provider as well.   To learn more about what you can do with MyChart, go to [email or web address].

## 2020-10-19 NOTE — Addendum Note (Signed)
Addended by: Alyson Ingles on: 10/19/2020 03:53 PM   Modules accepted: Orders

## 2020-10-24 DIAGNOSIS — G629 Polyneuropathy, unspecified: Secondary | ICD-10-CM | POA: Diagnosis not present

## 2020-10-24 DIAGNOSIS — R7989 Other specified abnormal findings of blood chemistry: Secondary | ICD-10-CM | POA: Diagnosis not present

## 2020-10-24 DIAGNOSIS — E7800 Pure hypercholesterolemia, unspecified: Secondary | ICD-10-CM | POA: Diagnosis not present

## 2020-10-24 DIAGNOSIS — Z6825 Body mass index (BMI) 25.0-25.9, adult: Secondary | ICD-10-CM | POA: Diagnosis not present

## 2020-10-24 DIAGNOSIS — I10 Essential (primary) hypertension: Secondary | ICD-10-CM | POA: Diagnosis not present

## 2020-10-24 DIAGNOSIS — E1169 Type 2 diabetes mellitus with other specified complication: Secondary | ICD-10-CM | POA: Diagnosis not present

## 2020-10-24 DIAGNOSIS — S91302A Unspecified open wound, left foot, initial encounter: Secondary | ICD-10-CM | POA: Diagnosis not present

## 2020-10-25 DIAGNOSIS — L97429 Non-pressure chronic ulcer of left heel and midfoot with unspecified severity: Secondary | ICD-10-CM | POA: Diagnosis not present

## 2020-10-25 DIAGNOSIS — I70244 Atherosclerosis of native arteries of left leg with ulceration of heel and midfoot: Secondary | ICD-10-CM | POA: Diagnosis not present

## 2020-10-25 DIAGNOSIS — I70245 Atherosclerosis of native arteries of left leg with ulceration of other part of foot: Secondary | ICD-10-CM | POA: Diagnosis not present

## 2020-10-25 DIAGNOSIS — E785 Hyperlipidemia, unspecified: Secondary | ICD-10-CM | POA: Diagnosis not present

## 2020-10-25 DIAGNOSIS — Z7984 Long term (current) use of oral hypoglycemic drugs: Secondary | ICD-10-CM | POA: Diagnosis not present

## 2020-10-25 DIAGNOSIS — E1151 Type 2 diabetes mellitus with diabetic peripheral angiopathy without gangrene: Secondary | ICD-10-CM | POA: Diagnosis not present

## 2020-10-25 DIAGNOSIS — R69 Illness, unspecified: Secondary | ICD-10-CM | POA: Diagnosis not present

## 2020-10-25 DIAGNOSIS — I10 Essential (primary) hypertension: Secondary | ICD-10-CM | POA: Diagnosis not present

## 2020-10-25 DIAGNOSIS — Z48812 Encounter for surgical aftercare following surgery on the circulatory system: Secondary | ICD-10-CM | POA: Diagnosis not present

## 2020-10-25 DIAGNOSIS — L97528 Non-pressure chronic ulcer of other part of left foot with other specified severity: Secondary | ICD-10-CM | POA: Diagnosis not present

## 2020-10-30 ENCOUNTER — Encounter (HOSPITAL_COMMUNITY): Payer: Medicare HMO

## 2020-10-30 ENCOUNTER — Ambulatory Visit: Payer: Self-pay | Admitting: Vascular Surgery

## 2020-10-30 ENCOUNTER — Other Ambulatory Visit (HOSPITAL_COMMUNITY)
Admission: RE | Admit: 2020-10-30 | Discharge: 2020-10-30 | Disposition: A | Discharge status: Home / Self Care | Payer: Medicare HMO | Source: Ambulatory Visit | Attending: Vascular Surgery | Admitting: Vascular Surgery

## 2020-10-30 ENCOUNTER — Other Ambulatory Visit: Payer: Self-pay

## 2020-10-30 ENCOUNTER — Encounter: Payer: Self-pay | Admitting: Vascular Surgery

## 2020-10-30 VITALS — BP 102/64 | HR 83 | Temp 97.1°F | Resp 16 | Ht 71.0 in | Wt 176.0 lb

## 2020-10-30 DIAGNOSIS — Z01812 Encounter for preprocedural laboratory examination: Secondary | ICD-10-CM | POA: Diagnosis not present

## 2020-10-30 DIAGNOSIS — Z20822 Contact with and (suspected) exposure to covid-19: Secondary | ICD-10-CM | POA: Insufficient documentation

## 2020-10-30 DIAGNOSIS — I739 Peripheral vascular disease, unspecified: Secondary | ICD-10-CM

## 2020-10-30 LAB — SARS CORONAVIRUS 2 (TAT 6-24 HRS): SARS Coronavirus 2: NEGATIVE

## 2020-10-30 NOTE — H&P (View-Only) (Signed)
[facility] [place] PROGRESS NOTE  ASSESSMENT / PLAN: Mark Dickerson is a 75 y.o. male status post aortobifemoral bypass and left femoral to below-knee popliteal artery bypass on 09/24/20.  He continues to do well. His left saphenectomy site is healing appropriately. His left third toe does not appear to be healing. I offered him operative debridement of the eschar on his left foot and left third toe amputation. He is amenable. Will plan to do this 11/01/20.  SUBJECTIVE: Continues to improve.  His left foot appears to be healing slowly.  OBJECTIVE: BP 102/64 (BP Location: Left Arm, Patient Position: Sitting, Cuff Size: Normal)   Pulse 83   Temp (!) 97.1 F (36.2 C) (Temporal)   Resp 16   Ht 5' 11" (1.803 m)   Wt 176 lb (79.8 kg)   SpO2 (!) 83%   BMI 24.55 kg/m     left distal thigh saphenectomy site improved. The left foot appears improved overall with the exception of the third toe. No more cellulitis about the incisions  CBC Latest Ref Rng & Units 09/28/2020 09/27/2020 09/27/2020  WBC 4.0 - 10.5 K/uL 8.7 8.8 8.7  Hemoglobin 13.0 - 17.0 g/dL 9.4(L) 9.4(L) 7.1(L)  Hematocrit 39.0 - 52.0 % 28.4(L) 27.9(L) 20.9(L)  Platelets 150 - 400 K/uL 176 158 161     CMP Latest Ref Rng & Units 10/12/2020 09/28/2020 09/27/2020  Glucose 65 - 99 mg/dL 112(H) 123(H) 116(H)  BUN 8 - 27 mg/dL 15 16 13  Creatinine 0.76 - 1.27 mg/dL 1.26 1.13 1.17  Sodium 134 - 144 mmol/L 134 135 137  Potassium 3.5 - 5.2 mmol/L 4.6 3.9 3.6  Chloride 96 - 106 mmol/L 96 102 106  CO2 20 - 29 mmol/L 23 23 22  Calcium 8.6 - 10.2 mg/dL 9.9 8.6(L) 8.6(L)  Total Protein 6.5 - 8.1 g/dL - - -  Total Bilirubin 0.3 - 1.2 mg/dL - - -  Alkaline Phos 38 - 126 U/L - - -  AST 15 - 41 U/L - - -  ALT 0 - 44 U/L - - -    Estimated Creatinine Clearance: 55.6 mL/min (by C-G formula based on SCr of 1.26 mg/dL).  LOWER EXTREMITY DOPPLER STUDY   Indications: Claudication, ulceration, gangrene, peripheral  artery  disease, and        Claudicant with left 3rd digit toenail bed eschar after  removal,        non-healing. Severe aortoiliac atherosclerotic disease.        Unsuccessful attempt at kissing iliac stent placement on  08/31/20 in        IR due to inability to catheterize left CIA.   High Risk Factors: Hypertension, hyperlipidemia, Diabetes, current smoker.    Vascular Interventions: Aorto-bifemoral bypass graft and left fem to  below knee             bypass graft 09/24/2020.   Performing Technologist: Matthew Cravey RVT     Examination Guidelines: A complete evaluation includes at minimum, Doppler  waveform signals and systolic blood pressure reading at the level of  bilateral  brachial, anterior tibial, and posterior tibial arteries, when vessel  segments  are accessible. Bilateral testing is considered an integral part of a  complete  examination. Photoelectric Plethysmograph (PPG) waveforms and toe systolic  pressure readings are included as required and additional duplex testing  as  needed. Limited examinations for reoccurring indications may be performed  as  noted.     ABI Findings:  +---------+------------------+-----+----------+--------+    Right  Rt Pressure (mmHg)IndexWaveform Comment   +---------+------------------+-----+----------+--------+  Brachial 119                      +---------+------------------+-----+----------+--------+  ATA   71        0.60            +---------+------------------+-----+----------+--------+  PTA   69        0.58 monophasic      +---------+------------------+-----+----------+--------+  DP                monophasic      +---------+------------------+-----+----------+--------+  Great Toe59        0.50             +---------+------------------+-----+----------+--------+   +---------+------------------+-----+----------+-------+  Left   Lt Pressure (mmHg)IndexWaveform Comment  +---------+------------------+-----+----------+-------+  Brachial 118                      +---------+------------------+-----+----------+-------+  ATA   102        0.86            +---------+------------------+-----+----------+-------+  PTA   125        1.05 monophasic      +---------+------------------+-----+----------+-------+  DP                monophasic      +---------+------------------+-----+----------+-------+  Great Toe27        0.23            +---------+------------------+-----+----------+-------+   +-------+-----------+-----------+------------+------------+  ABI/TBIToday's ABIToday's TBIPrevious ABIPrevious TBI  +-------+-----------+-----------+------------+------------+  Right 0.60    0.50    0.50    0.31      +-------+-----------+-----------+------------+------------+  Left  1.05    0.23    0.35    0        +-------+-----------+-----------+------------+------------+       Bilateral ABIs appear increased compared to prior study on 09/18/2020.    Summary:  Right: Resting right ankle-brachial index indicates moderate right lower  extremity arterial disease. The right toe-brachial index is abnormal. RT  great toe pressure = 59 mmHg.   Left: Resting left ankle-brachial index is within normal range. No  evidence of significant left lower extremity arterial disease. The left  toe-brachial index is abnormal. LT Great toe pressure = 27 mmHg.  Although ankle brachial indices are within normal limits (0.95-1.29),  arterial Doppler waveforms at the ankle suggest some component of arterial  occlusive disease.     *See table(s) above for  measurements and observations.      Electronically signed by Heath Lark on 10/16/2020 at 4:18:05 PM.    Rande Brunt. Lenell Antu, MD [facility] Phone Number: [phone] 10/30/2020 8:17 AM

## 2020-10-30 NOTE — Progress Notes (Signed)
[facility] [place] PROGRESS NOTE  ASSESSMENT / PLAN: Mark Dickerson is a 75 y.o. male status post aortobifemoral bypass and left femoral to below-knee popliteal artery bypass on 09/24/20.  He continues to do well. His left saphenectomy site is healing appropriately. His left third toe does not appear to be healing. I offered him operative debridement of the eschar on his left foot and left third toe amputation. He is amenable. Will plan to do this 11/01/20.  SUBJECTIVE: Continues to improve.  His left foot appears to be healing slowly.  OBJECTIVE: BP 102/64 (BP Location: Left Arm, Patient Position: Sitting, Cuff Size: Normal)   Pulse 83   Temp (!) 97.1 F (36.2 C) (Temporal)   Resp 16   Ht 5\' 11"  (1.803 m)   Wt 176 lb (79.8 kg)   SpO2 (!) 83%   BMI 24.55 kg/m     left distal thigh saphenectomy site improved. The left foot appears improved overall with the exception of the third toe. No more cellulitis about the incisions  CBC Latest Ref Rng & Units 09/28/2020 09/27/2020 09/27/2020  WBC 4.0 - 10.5 K/uL 8.7 8.8 8.7  Hemoglobin 13.0 - 17.0 g/dL 09/29/2020) 2.3(N) 7.1(L)  Hematocrit 39.0 - 52.0 % 28.4(L) 27.9(L) 20.9(L)  Platelets 150 - 400 K/uL 176 158 161     CMP Latest Ref Rng & Units 10/12/2020 09/28/2020 09/27/2020  Glucose 65 - 99 mg/dL 09/29/2020) 443(X) 540(G)  BUN 8 - 27 mg/dL 15 16 13   Creatinine 0.76 - 1.27 mg/dL 867(Y 1.95  Sodium 134 - 144 mmol/L 134 135 137  Potassium 3.5 - 5.2 mmol/L 4.6 3.9 3.6  Chloride 96 - 106 mmol/L 96 102 106  CO2 20 - 29 mmol/L 23 23 22   Calcium 8.6 - 10.2 mg/dL 9.9 0.93) 2.67)  Total Protein 6.5 - 8.1 g/dL - - -  Total Bilirubin 0.3 - 1.2 mg/dL - - -  Alkaline Phos 38 - 126 U/L - - -  AST 15 - 41 U/L - - -  ALT 0 - 44 U/L - - -    Estimated Creatinine Clearance: 55.6 mL/min (by C-G formula based on SCr of 1.26 mg/dL).  LOWER EXTREMITY DOPPLER STUDY   Indications: Claudication, ulceration, gangrene, peripheral  artery  disease, and        Claudicant with left 3rd digit toenail bed eschar after  removal,        non-healing. Severe aortoiliac atherosclerotic disease.        Unsuccessful attempt at kissing iliac stent placement on  08/31/20 in        IR due to inability to catheterize left CIA.   High Risk Factors: Hypertension, hyperlipidemia, Diabetes, current smoker.    Vascular Interventions: Aorto-bifemoral bypass graft and left fem to  below knee             bypass graft 09/24/2020.   Performing Technologist: 5.8(K RVT     Examination Guidelines: A complete evaluation includes at minimum, Doppler  waveform signals and systolic blood pressure reading at the level of  bilateral  brachial, anterior tibial, and posterior tibial arteries, when vessel  segments  are accessible. Bilateral testing is considered an integral part of a  complete  examination. Photoelectric Plethysmograph (PPG) waveforms and toe systolic  pressure readings are included as required and additional duplex testing  as  needed. Limited examinations for reoccurring indications may be performed  as  noted.     ABI Findings:  +---------+------------------+-----+----------+--------+  Right  Rt Pressure (mmHg)IndexWaveform Comment   +---------+------------------+-----+----------+--------+  Brachial 119                      +---------+------------------+-----+----------+--------+  ATA   71        0.60            +---------+------------------+-----+----------+--------+  PTA   69        0.58 monophasic      +---------+------------------+-----+----------+--------+  DP                monophasic      +---------+------------------+-----+----------+--------+  Great Toe59        0.50             +---------+------------------+-----+----------+--------+   +---------+------------------+-----+----------+-------+  Left   Lt Pressure (mmHg)IndexWaveform Comment  +---------+------------------+-----+----------+-------+  Brachial 118                      +---------+------------------+-----+----------+-------+  ATA   102        0.86            +---------+------------------+-----+----------+-------+  PTA   125        1.05 monophasic      +---------+------------------+-----+----------+-------+  DP                monophasic      +---------+------------------+-----+----------+-------+  Great Toe27        0.23            +---------+------------------+-----+----------+-------+   +-------+-----------+-----------+------------+------------+  ABI/TBIToday's ABIToday's TBIPrevious ABIPrevious TBI  +-------+-----------+-----------+------------+------------+  Right 0.60    0.50    0.50    0.31      +-------+-----------+-----------+------------+------------+  Left  1.05    0.23    0.35    0        +-------+-----------+-----------+------------+------------+       Bilateral ABIs appear increased compared to prior study on 09/18/2020.    Summary:  Right: Resting right ankle-brachial index indicates moderate right lower  extremity arterial disease. The right toe-brachial index is abnormal. RT  great toe pressure = 59 mmHg.   Left: Resting left ankle-brachial index is within normal range. No  evidence of significant left lower extremity arterial disease. The left  toe-brachial index is abnormal. LT Great toe pressure = 27 mmHg.  Although ankle brachial indices are within normal limits (0.95-1.29),  arterial Doppler waveforms at the ankle suggest some component of arterial  occlusive disease.     *See table(s) above for  measurements and observations.      Electronically signed by Heath Lark on 10/16/2020 at 4:18:05 PM.    Rande Brunt. Lenell Antu, MD [facility] Phone Number: [phone] 10/30/2020 8:17 AM

## 2020-10-31 ENCOUNTER — Encounter (HOSPITAL_COMMUNITY): Payer: Self-pay | Admitting: Vascular Surgery

## 2020-10-31 NOTE — Progress Notes (Signed)
PCP: Cardiologist: Dr. Bryan Lemma  EKG: 10/12/20 CXR: 10/12/20 ECHO: 09/20/20 Stress Test: denies Cardiac Cath: denies  Fasting Blood Sugar- does not check BG's Checks Blood Sugar__0_ times a day  ASA: Last dose 10/31/20 Plavix: Last dose 10/31/20.  Dr. Lenell Antu aware.  OSA/CPAP: No  Covid test 10/30/20  Anesthesia Review:  Yes, Daniel Nones reviewed chart and we discussed.   Patient denies shortness of breath, fever, cough, and chest pain at PAT appointment.  Patient verbalized understanding of instructions provided today at the PAT appointment.  Patient asked to review instructions at home and day of surgery.

## 2020-10-31 NOTE — Anesthesia Preprocedure Evaluation (Addendum)
Anesthesia Evaluation  Patient identified by MRN, date of birth, ID band Patient awake    Reviewed: Allergy & Precautions, H&P , NPO status , Patient's Chart, lab work & pertinent test results  Airway Mallampati: II  TM Distance: >3 FB Neck ROM: Full    Dental no notable dental hx. (+) Edentulous Upper, Dental Advisory Given   Pulmonary Current SmokerPatient did not abstain from smoking.,    Pulmonary exam normal breath sounds clear to auscultation       Cardiovascular Exercise Tolerance: Good hypertension, Pt. on medications and Pt. on home beta blockers + Past MI and + Peripheral Vascular Disease   Rhythm:Regular Rate:Normal     Neuro/Psych negative neurological ROS  negative psych ROS   GI/Hepatic negative GI ROS, Neg liver ROS,   Endo/Other  diabetes, Insulin Dependent  Renal/GU negative Renal ROS  negative genitourinary   Musculoskeletal   Abdominal   Peds  Hematology negative hematology ROS (+)   Anesthesia Other Findings   Reproductive/Obstetrics negative OB ROS                           Anesthesia Physical Anesthesia Plan  ASA: III  Anesthesia Plan: MAC and Regional   Post-op Pain Management:    Induction: Intravenous  PONV Risk Score and Plan: 0  Airway Management Planned: Simple Face Mask  Additional Equipment:   Intra-op Plan:   Post-operative Plan:   Informed Consent: I have reviewed the patients History and Physical, chart, labs and discussed the procedure including the risks, benefits and alternatives for the proposed anesthesia with the patient or authorized representative who has indicated his/her understanding and acceptance.     Dental advisory given  Plan Discussed with: CRNA  Anesthesia Plan Comments: (PAT note by Karoline Caldwell, PA-C: Patient recently underwent  peripheral angiogram 09/19/2020 which showed severe bilateral PAD. Surgical intervention was  recommended. He had to be admitted for accelerated work-up for rapidly progressing left foot gangrene.  Cardiology was then consulted for preoperative evaluation.  His echocardiogram showed an LVEF of 55-60% and no significant valvular abnormalities.  He was felt to be acceptable for surgery.  He then underwent aortobifem bypass and left common femoral popliteal bypass on 09/24/2020.  His postoperative course was complicated by acute blood loss anemia requiring transfusion.  He also developed chest pain on postop day 2.  His high-sensitivity troponins peaked at 2219.  He was started on IV heparin.  Cardiac catheterization was recommended however, patient declined due to his recent surgery.  He was discharged on dual antiplatelet therapy aspirin and Plavix, Lopressor, Imdur, Benzapril and Lipitor.  Plan for coronary CTA as outpatient was recommended.  He was last seen by Coletta Memos, NP 10/19/2020 and at that time noted to be feeling much better and having less trouble with his breathing (had recently been seen 10/12/2020 for reported shortness of breath, blood pressure was noted to be running low at that time).  Per Jesse's note, "Shortness of breath-breathing  to baseline.  Relief in symptoms with normalization of blood pressure.  Previously low suspicion for PE.  O2 saturation previously normal on room air.  CXR showed no acute abnormalities and overall improved aeration when compared with prior study.  BNP showed elevation at 549.  Discontinue follow-up echocardiogram. Continue to monitor."  He was recommended to continue aspirin, Plavix, metoprolol, Imdur, benazepril, HCTZ and increase physical activity as tolerated.  Patient wished to defer coronary CTA at that time.  He was advised to follow-up in 3 months.  Patient did not hold Plavix as instructed by vascular surgery; took dose 10/31/2020.  Contacted Dr. Stanford Breed who confirmed he was okay to proceed with surgery despite patient not holding Plavix.  He will  need day of surgery labs and evaluation.  EKG 10/12/2020: NSR.  Rate 60.  CHEST - 2 VIEW 10/12/2020: COMPARISON:  09/25/2020, 09/24/2020  FINDINGS: Cardiomediastinal silhouette unchanged in size and contour. No evidence of central vascular congestion. Overall the aeration has improved. No interlobular septal thickening pneumothorax pleural effusion or confluent airspace disease. Degenerative changes of the spine  IMPRESSION: Negative for acute cardiopulmonary disease, with overall improved aeration since the prior  TTE 09/20/2020: 1. Left ventricular ejection fraction, by estimation, is 55 to 60%. The  left ventricle has normal function. The left ventricle has no regional  wall motion abnormalities. Left ventricular diastolic function could not  be evaluated.  2. Right ventricular systolic function is normal. The right ventricular  size is normal. There is normal pulmonary artery systolic pressure.  3. The mitral valve is normal in structure. Trivial mitral valve  regurgitation. No evidence of mitral stenosis.  4. The aortic valve is tricuspid. Aortic valve regurgitation is not  visualized. No aortic stenosis is present.  5. The inferior vena cava is normal in size with greater than 50%  respiratory variability, suggesting right atrial pressure of 3 mmHg.   )      Anesthesia Quick Evaluation

## 2020-10-31 NOTE — Progress Notes (Signed)
Anesthesia Chart Review: Same day workup  Patient recently underwent  peripheral angiogram 09/19/2020 which showed severe bilateral PAD. Surgical intervention was recommended. He had to be admitted for accelerated work-up for rapidly progressing left foot gangrene.  Cardiology was then consulted for preoperative evaluation.  His echocardiogram showed an LVEF of 55-60% and no significant valvular abnormalities.  He was felt to be acceptable for surgery.  He then underwent aortobifem bypass and left common femoral popliteal bypass on 09/24/2020.  His postoperative course was complicated by acute blood loss anemia requiring transfusion.  He also developed chest pain on postop day 2.  His high-sensitivity troponins peaked at 2219.  He was started on IV heparin.  Cardiac catheterization was recommended however, patient declined due to his recent surgery.  He was discharged on dual antiplatelet therapy aspirin and Plavix, Lopressor, Imdur, Benzapril and Lipitor.  Plan for coronary CTA as outpatient was recommended.  He was last seen by Edd Fabian, NP 10/19/2020 and at that time noted to be feeling much better and having less trouble with his breathing (had recently been seen 10/12/2020 for reported shortness of breath, blood pressure was noted to be running low at that time).  Per Jesse's note, "Shortness of breath-breathing  to baseline.  Relief in symptoms with normalization of blood pressure.  Previously low suspicion for PE.  O2 saturation previously normal on room air.  CXR showed no acute abnormalities and overall improved aeration when compared with prior study.  BNP showed elevation at 549.  Discontinue follow-up echocardiogram. Continue to monitor."  He was recommended to continue aspirin, Plavix, metoprolol, Imdur, benazepril, HCTZ and increase physical activity as tolerated.  Patient wished to defer coronary CTA at that time.  He was advised to follow-up in 3 months.  Patient did not hold Plavix as  instructed by vascular surgery; took dose 10/31/2020.  Contacted Dr. Lenell Antu who confirmed he was okay to proceed with surgery despite patient not holding Plavix.  He will need day of surgery labs and evaluation.  EKG 10/12/2020: NSR.  Rate 60.  CHEST - 2 VIEW 10/12/2020: COMPARISON:  09/25/2020, 09/24/2020  FINDINGS: Cardiomediastinal silhouette unchanged in size and contour. No evidence of central vascular congestion. Overall the aeration has improved. No interlobular septal thickening pneumothorax pleural effusion or confluent airspace disease. Degenerative changes of the spine  IMPRESSION: Negative for acute cardiopulmonary disease, with overall improved aeration since the prior  TTE 09/20/2020: 1. Left ventricular ejection fraction, by estimation, is 55 to 60%. The  left ventricle has normal function. The left ventricle has no regional  wall motion abnormalities. Left ventricular diastolic function could not  be evaluated.  2. Right ventricular systolic function is normal. The right ventricular  size is normal. There is normal pulmonary artery systolic pressure.  3. The mitral valve is normal in structure. Trivial mitral valve  regurgitation. No evidence of mitral stenosis.  4. The aortic valve is tricuspid. Aortic valve regurgitation is not  visualized. No aortic stenosis is present.  5. The inferior vena cava is normal in size with greater than 50%  respiratory variability, suggesting right atrial pressure of 3 mmHg.    Zannie Cove [facility]/Anesthesiology Phone [phone] 10/31/2020 4:17 PM

## 2020-11-01 ENCOUNTER — Encounter (HOSPITAL_COMMUNITY): Payer: Self-pay | Admitting: Vascular Surgery

## 2020-11-01 ENCOUNTER — Ambulatory Visit (HOSPITAL_COMMUNITY): Payer: Medicare HMO | Admitting: Physician Assistant

## 2020-11-01 ENCOUNTER — Encounter (HOSPITAL_COMMUNITY)
Admission: RE | Disposition: A | Discharge status: Home / Self Care | Payer: Self-pay | Source: Home / Self Care | Attending: Vascular Surgery

## 2020-11-01 ENCOUNTER — Other Ambulatory Visit: Payer: Self-pay

## 2020-11-01 ENCOUNTER — Ambulatory Visit (HOSPITAL_COMMUNITY)
Admission: RE | Admit: 2020-11-01 | Discharge: 2020-11-01 | Disposition: A | Discharge status: Home / Self Care | Payer: Medicare HMO | Attending: Vascular Surgery | Admitting: Vascular Surgery

## 2020-11-01 DIAGNOSIS — I70262 Atherosclerosis of native arteries of extremities with gangrene, left leg: Secondary | ICD-10-CM

## 2020-11-01 DIAGNOSIS — X58XXXA Exposure to other specified factors, initial encounter: Secondary | ICD-10-CM | POA: Insufficient documentation

## 2020-11-01 DIAGNOSIS — T8189XA Other complications of procedures, not elsewhere classified, initial encounter: Secondary | ICD-10-CM | POA: Diagnosis not present

## 2020-11-01 DIAGNOSIS — S91302A Unspecified open wound, left foot, initial encounter: Secondary | ICD-10-CM | POA: Diagnosis not present

## 2020-11-01 DIAGNOSIS — I10 Essential (primary) hypertension: Secondary | ICD-10-CM | POA: Diagnosis not present

## 2020-11-01 DIAGNOSIS — I214 Non-ST elevation (NSTEMI) myocardial infarction: Secondary | ICD-10-CM | POA: Diagnosis not present

## 2020-11-01 DIAGNOSIS — S91105A Unspecified open wound of left lesser toe(s) without damage to nail, initial encounter: Secondary | ICD-10-CM | POA: Diagnosis not present

## 2020-11-01 DIAGNOSIS — E1152 Type 2 diabetes mellitus with diabetic peripheral angiopathy with gangrene: Secondary | ICD-10-CM | POA: Diagnosis not present

## 2020-11-01 DIAGNOSIS — I739 Peripheral vascular disease, unspecified: Secondary | ICD-10-CM | POA: Insufficient documentation

## 2020-11-01 DIAGNOSIS — T82898A Other specified complication of vascular prosthetic devices, implants and grafts, initial encounter: Secondary | ICD-10-CM | POA: Diagnosis not present

## 2020-11-01 DIAGNOSIS — Z794 Long term (current) use of insulin: Secondary | ICD-10-CM | POA: Diagnosis not present

## 2020-11-01 DIAGNOSIS — E785 Hyperlipidemia, unspecified: Secondary | ICD-10-CM | POA: Insufficient documentation

## 2020-11-01 DIAGNOSIS — S98132A Complete traumatic amputation of one left lesser toe, initial encounter: Secondary | ICD-10-CM

## 2020-11-01 HISTORY — DX: Acute myocardial infarction, unspecified: I21.9

## 2020-11-01 HISTORY — PX: WOUND DEBRIDEMENT: SHX247

## 2020-11-01 HISTORY — PX: AMPUTATION: SHX166

## 2020-11-01 LAB — POCT I-STAT, CHEM 8
BUN: 23 mg/dL (ref 8–23)
Calcium, Ion: 1.24 mmol/L (ref 1.15–1.40)
Chloride: 98 mmol/L (ref 98–111)
Creatinine, Ser: 1.2 mg/dL (ref 0.61–1.24)
Glucose, Bld: 115 mg/dL — ABNORMAL HIGH (ref 70–99)
HCT: 36 % — ABNORMAL LOW (ref 39.0–52.0)
Hemoglobin: 12.2 g/dL — ABNORMAL LOW (ref 13.0–17.0)
Potassium: 4.1 mmol/L (ref 3.5–5.1)
Sodium: 133 mmol/L — ABNORMAL LOW (ref 135–145)
TCO2: 27 mmol/L (ref 22–32)

## 2020-11-01 LAB — GLUCOSE, CAPILLARY
Glucose-Capillary: 121 mg/dL — ABNORMAL HIGH (ref 70–99)
Glucose-Capillary: 129 mg/dL — ABNORMAL HIGH (ref 70–99)

## 2020-11-01 SURGERY — DEBRIDEMENT, WOUND
Anesthesia: Monitor Anesthesia Care | Site: Foot | Laterality: Left

## 2020-11-01 MED ORDER — FENTANYL CITRATE (PF) 250 MCG/5ML IJ SOLN
INTRAMUSCULAR | Status: AC
  Filled 2020-11-01: qty 5

## 2020-11-01 MED ORDER — FENTANYL CITRATE (PF) 100 MCG/2ML IJ SOLN
INTRAMUSCULAR | Status: DC | PRN
  Administered 2020-11-01 (×2): 50 ug via INTRAVENOUS

## 2020-11-01 MED ORDER — BUPIVACAINE-EPINEPHRINE (PF) 0.5% -1:200000 IJ SOLN
INTRAMUSCULAR | Status: DC | PRN
  Administered 2020-11-01: 30 mL via PERINEURAL

## 2020-11-01 MED ORDER — OXYCODONE-ACETAMINOPHEN 5-325 MG PO TABS: 1.0000 | ORAL_TABLET | ORAL | 0 refills | Status: DC | PRN

## 2020-11-01 MED ORDER — PROPOFOL 500 MG/50ML IV EMUL
INTRAVENOUS | Status: DC | PRN
  Administered 2020-11-01: 100 ug/kg/min via INTRAVENOUS

## 2020-11-01 MED ORDER — MIDAZOLAM HCL 5 MG/5ML IJ SOLN
INTRAMUSCULAR | Status: DC | PRN
  Administered 2020-11-01 (×2): 1 mg via INTRAVENOUS

## 2020-11-01 MED ORDER — CHLORHEXIDINE GLUCONATE 4 % EX LIQD: 60.0000 mL | Freq: Once | CUTANEOUS | Status: DC

## 2020-11-01 MED ORDER — LACTATED RINGERS IV SOLN: INTRAVENOUS | Status: DC

## 2020-11-01 MED ORDER — MIDAZOLAM HCL 2 MG/2ML IJ SOLN
INTRAMUSCULAR | Status: AC
  Filled 2020-11-01: qty 2

## 2020-11-01 MED ORDER — ACETAMINOPHEN 500 MG PO TABS
1000.0000 mg | ORAL_TABLET | Freq: Once | ORAL | Status: DC
  Filled 2020-11-01: qty 2

## 2020-11-01 MED ORDER — PROPOFOL 10 MG/ML IV BOLUS
INTRAVENOUS | Status: AC
  Filled 2020-11-01: qty 20

## 2020-11-01 MED ORDER — FENTANYL CITRATE (PF) 100 MCG/2ML IJ SOLN: 25.0000 ug | INTRAMUSCULAR | Status: DC | PRN

## 2020-11-01 MED ORDER — CEFAZOLIN SODIUM-DEXTROSE 2-4 GM/100ML-% IV SOLN
2.0000 g | INTRAVENOUS | Status: AC
  Administered 2020-11-01: 2 g via INTRAVENOUS
  Filled 2020-11-01: qty 100

## 2020-11-01 MED ORDER — 0.9 % SODIUM CHLORIDE (POUR BTL) OPTIME
TOPICAL | Status: DC | PRN
  Administered 2020-11-01: 1000 mL

## 2020-11-01 MED ORDER — CHLORHEXIDINE GLUCONATE 0.12 % MT SOLN
15.0000 mL | Freq: Once | OROMUCOSAL | Status: AC
  Administered 2020-11-01: 15 mL via OROMUCOSAL
  Filled 2020-11-01: qty 15

## 2020-11-01 MED ORDER — ORAL CARE MOUTH RINSE: 15.0000 mL | Freq: Once | OROMUCOSAL | Status: AC

## 2020-11-01 MED ORDER — SODIUM CHLORIDE 0.9 % IV SOLN: INTRAVENOUS | Status: DC

## 2020-11-01 SURGICAL SUPPLY — 38 items
BLADE AVERAGE 25X9 (BLADE) IMPLANT
BLADE SURG 21 STRL SS (BLADE) ×2 IMPLANT
BNDG CONFORM 3 STRL LF (GAUZE/BANDAGES/DRESSINGS) ×2 IMPLANT
BNDG ELASTIC 4X5.8 VLCR STR LF (GAUZE/BANDAGES/DRESSINGS) ×2 IMPLANT
BNDG GAUZE ELAST 4 BULKY (GAUZE/BANDAGES/DRESSINGS) ×2 IMPLANT
CANISTER SUCT 3000ML PPV (MISCELLANEOUS) ×2 IMPLANT
COVER SURGICAL LIGHT HANDLE (MISCELLANEOUS) ×2 IMPLANT
COVER WAND RF STERILE (DRAPES) ×2 IMPLANT
DRAPE HALF SHEET 40X57 (DRAPES) ×2 IMPLANT
DRAPE ORTHO SPLIT 77X108 STRL (DRAPES) ×2
DRAPE SURG ORHT 6 SPLT 77X108 (DRAPES) ×2 IMPLANT
ELECT REM PT RETURN 9FT ADLT (ELECTROSURGICAL) ×2
ELECTRODE REM PT RTRN 9FT ADLT (ELECTROSURGICAL) ×1 IMPLANT
GAUZE SPONGE 4X4 12PLY STRL (GAUZE/BANDAGES/DRESSINGS) ×4 IMPLANT
GAUZE XEROFORM 1X8 LF (GAUZE/BANDAGES/DRESSINGS) ×2 IMPLANT
GAUZE XEROFORM 5X9 LF (GAUZE/BANDAGES/DRESSINGS) ×2 IMPLANT
GLOVE SURG SS PI 8.0 STRL IVOR (GLOVE) ×2 IMPLANT
GLOVE SURG UNDER POLY LF SZ6.5 (GLOVE) ×6 IMPLANT
GLOVE SURG UNDER POLY LF SZ7 (GLOVE) ×2 IMPLANT
GOWN STRL REUS W/ TWL LRG LVL3 (GOWN DISPOSABLE) ×2 IMPLANT
GOWN STRL REUS W/ TWL XL LVL3 (GOWN DISPOSABLE) ×1 IMPLANT
GOWN STRL REUS W/TWL LRG LVL3 (GOWN DISPOSABLE) ×2
GOWN STRL REUS W/TWL XL LVL3 (GOWN DISPOSABLE) ×1
KIT BASIN OR (CUSTOM PROCEDURE TRAY) ×2 IMPLANT
KIT TURNOVER KIT B (KITS) ×2 IMPLANT
NS IRRIG 1000ML POUR BTL (IV SOLUTION) ×2 IMPLANT
PACK GENERAL/GYN (CUSTOM PROCEDURE TRAY) ×2 IMPLANT
PAD ARMBOARD 7.5X6 YLW CONV (MISCELLANEOUS) ×4 IMPLANT
STAPLER VISISTAT 35W (STAPLE) ×2 IMPLANT
SUT ETHILON 2 0 PSLX (SUTURE) ×4 IMPLANT
SUT ETHILON 3 0 PS 1 (SUTURE) ×4 IMPLANT
SUT SILK 2 0SH CR/8 30 (SUTURE) IMPLANT
SUT VIC AB 2-0 CT1 18 (SUTURE) ×2 IMPLANT
SUT VIC AB 3-0 SH 8-18 (SUTURE) ×2 IMPLANT
TOWEL GREEN STERILE (TOWEL DISPOSABLE) ×4 IMPLANT
TOWEL GREEN STERILE FF (TOWEL DISPOSABLE) ×2 IMPLANT
UNDERPAD 30X36 HEAVY ABSORB (UNDERPADS AND DIAPERS) ×2 IMPLANT
WATER STERILE IRR 1000ML POUR (IV SOLUTION) ×2 IMPLANT

## 2020-11-01 NOTE — Anesthesia Procedure Notes (Signed)
Procedure Name: MAC Date/Time: 11/01/2020 8:38 AM Performed by: Candis Shine, CRNA Pre-anesthesia Checklist: Patient identified, Emergency Drugs available, Suction available, Patient being monitored and Timeout performed Patient Re-evaluated:Patient Re-evaluated prior to induction Oxygen Delivery Method: Simple face mask Dental Injury: Teeth and Oropharynx as per pre-operative assessment

## 2020-11-01 NOTE — Anesthesia Postprocedure Evaluation (Signed)
Anesthesia Post Note  Patient: Mark Dickerson  Procedure(s) Performed: DEBRIDEMENT LEFT FOOT WOUND (Left Foot) LEFT THIRD TOE AMPUTATION (Left Foot)     Patient location during evaluation: PACU Anesthesia Type: Regional and MAC Level of consciousness: awake and alert Pain management: pain level controlled Vital Signs Assessment: post-procedure vital signs reviewed and stable Respiratory status: spontaneous breathing, nonlabored ventilation and respiratory function stable Cardiovascular status: stable and blood pressure returned to baseline Postop Assessment: no apparent nausea or vomiting Anesthetic complications: no   No complications documented.  Last Vitals:  Vitals:   11/01/20 1005 11/01/20 1010  BP: 135/67 138/64  Pulse:  65  Resp: 11 12  Temp:  36.7 C  SpO2:  100%    Last Pain:  Vitals:   11/01/20 1010  TempSrc:   PainSc: 0-No pain                 Deziah Renwick,W. EDMOND

## 2020-11-01 NOTE — Op Note (Signed)
DATE OF SERVICE: 11/01/2020  PATIENT:  Mark Dickerson  75 y.o. male  PRE-OPERATIVE DIAGNOSIS:  Left foot wounds after aortobifemoral bypass and left fem-pop bypass for tissue loss  POST-OPERATIVE DIAGNOSIS:  Same  PROCEDURE:   Sharp debridement of eschar of left heel, left great toe Partial left third toe amputation  SURGEON:  Surgeon(s) and Role:    * Cherre Robins, MD - Primary  ASSISTANT: none  ANESTHESIA:   regional and MAC  EBL: min  BLOOD ADMINISTERED:none  DRAINS: none   LOCAL MEDICATIONS USED:  NONE  SPECIMEN:  none  COUNTS: confirmed correct.  TOURNIQUET:  None  PATIENT DISPOSITION:  PACU - hemodynamically stable.   Delay start of Pharmacological VTE agent (>24hrs) due to surgical blood loss or risk of bleeding: no  INDICATION FOR PROCEDURE: Mark Dickerson is a 75 y.o. male with left foot wounds after aortobifemoral bypass and left fem-pop bypass for tissue loss. His left foot wounds have improved significantly since revascularization, but he has a eschar about his left heel, left great toe and left third toe which has not yet resolved. After careful discussion of risks, benefits, and alternatives I offered the patient surgical debridement and partial third toe amputation.The patient understood and wished to proceed.  OPERATIVE FINDINGS: healthy tissue encountered below heel eschar. Third toe partial amputation with healthy bleeding tissue. Left great toe eschar debrided. Left great toe phalanx very close to eschar. Will observe. May need amputation.  DESCRIPTION OF PROCEDURE: After identification of the patient in the pre-operative holding area, the patient was transferred to the operating room. The patient was positioned supine on the operating room table. Anesthesia was induced. The left foot was prepped and draped in standard fashion. A surgical pause was performed confirming correct patient, procedure, and operative location.  Using a combination of  mechanical and sharp debridement, the eschar overlaying of the wounds of the left foot were removed. Healthy tissue was noted below the heel wounds. The third toe was ulcerated to the bone. A partial toe amputation was performed with a bone cutter. Healthy bleeding tissue was encountered. The amputation was closed with 3-O nylon. The great toe eschar was removed. Phalanx bone was closely associated with the eschar. I was concerned that he may require a great toe amputation in the future, but we did not discuss doing this today, so I elected to stop the case here. The wounds were dressed using xeroform, 4x4, kerlix, and ace wrap.  Upon completion of the case instrument and sharps counts were confirmed correct. The patient was transferred to the PACU in good condition. I was present for all portions of the procedure.  Yevonne Aline. Stanford Breed, MD [facility] Phone Number: [phone] 11/01/2020 9:57 AM

## 2020-11-01 NOTE — Interval H&P Note (Signed)
History and Physical Interval Note:  11/01/2020 7:36 AM  Mark Dickerson  has presented today for surgery, with the diagnosis of gangrene.  The various methods of treatment have been discussed with the patient and family. After consideration of risks, benefits and other options for treatment, the patient has consented to  Procedure(s): DEBRIDEMENT LEFT FOOT WOUND (Left) LEFT THIRD TOE AMPUTATION (Left) as a surgical intervention.  The patient's history has been reviewed, patient examined, no change in status, stable for surgery.  I have reviewed the patient's chart and labs.  Questions were answered to the patient's satisfaction.     Leonie Douglas

## 2020-11-01 NOTE — Anesthesia Procedure Notes (Signed)
Anesthesia Regional Block: Popliteal block   Pre-Anesthetic Checklist: ,, timeout performed, Correct Patient, Correct Site, Correct Laterality, Correct Procedure, Correct Position, site marked, Risks and benefits discussed, pre-op evaluation,  At surgeon's request and post-op pain management  Laterality: Left  Prep: Maximum Sterile Barrier Precautions used, chloraprep       Needles:  Injection technique: Single-shot  Needle Type: Echogenic Stimulator Needle     Needle Length: 9cm  Needle Gauge: 21     Additional Needles:   Procedures:, nerve stimulator,,, ultrasound used (permanent image in chart),,,,  Narrative:  Start time: 11/01/2020 8:16 AM End time: 11/01/2020 8:26 AM Injection made incrementally with aspirations every 5 mL. Anesthesiologist: Gaynelle Adu, MD  Additional Notes: 2% Lidocaine skin wheel.

## 2020-11-01 NOTE — Transfer of Care (Signed)
Immediate Anesthesia Transfer of Care Note  Patient: Mark Dickerson  Procedure(s) Performed: DEBRIDEMENT LEFT FOOT WOUND (Left Foot) LEFT THIRD TOE AMPUTATION (Left Foot)  Patient Location: PACU  Anesthesia Type:MAC and Regional  Level of Consciousness: awake, alert  and oriented  Airway & Oxygen Therapy: Patient Spontanous Breathing  Post-op Assessment: Report given to RN and Post -op Vital signs reviewed and stable  Post vital signs: Reviewed and stable  Last Vitals:  Vitals Value Taken Time  BP    Temp    Pulse    Resp    SpO2      Last Pain:  Vitals:   11/01/20 0630  TempSrc:   PainSc: 0-No pain         Complications: No complications documented.

## 2020-11-02 ENCOUNTER — Encounter (HOSPITAL_COMMUNITY): Payer: Self-pay | Admitting: Vascular Surgery

## 2020-11-02 ENCOUNTER — Other Ambulatory Visit: Payer: Self-pay

## 2020-11-05 NOTE — Telephone Encounter (Signed)
Patient was evaluated at 10/19/20 office appointment

## 2020-11-06 DIAGNOSIS — E785 Hyperlipidemia, unspecified: Secondary | ICD-10-CM | POA: Diagnosis not present

## 2020-11-06 DIAGNOSIS — L97422 Non-pressure chronic ulcer of left heel and midfoot with fat layer exposed: Secondary | ICD-10-CM | POA: Diagnosis not present

## 2020-11-06 DIAGNOSIS — I70262 Atherosclerosis of native arteries of extremities with gangrene, left leg: Secondary | ICD-10-CM | POA: Diagnosis not present

## 2020-11-06 DIAGNOSIS — Z89422 Acquired absence of other left toe(s): Secondary | ICD-10-CM | POA: Diagnosis not present

## 2020-11-06 DIAGNOSIS — I214 Non-ST elevation (NSTEMI) myocardial infarction: Secondary | ICD-10-CM | POA: Diagnosis not present

## 2020-11-06 DIAGNOSIS — I119 Hypertensive heart disease without heart failure: Secondary | ICD-10-CM | POA: Diagnosis not present

## 2020-11-06 DIAGNOSIS — E1151 Type 2 diabetes mellitus with diabetic peripheral angiopathy without gangrene: Secondary | ICD-10-CM | POA: Diagnosis not present

## 2020-11-06 DIAGNOSIS — Z4781 Encounter for orthopedic aftercare following surgical amputation: Secondary | ICD-10-CM | POA: Diagnosis not present

## 2020-11-06 DIAGNOSIS — R69 Illness, unspecified: Secondary | ICD-10-CM | POA: Diagnosis not present

## 2020-11-06 DIAGNOSIS — M479 Spondylosis, unspecified: Secondary | ICD-10-CM | POA: Diagnosis not present

## 2020-11-09 DIAGNOSIS — I214 Non-ST elevation (NSTEMI) myocardial infarction: Secondary | ICD-10-CM | POA: Diagnosis not present

## 2020-11-09 DIAGNOSIS — R69 Illness, unspecified: Secondary | ICD-10-CM | POA: Diagnosis not present

## 2020-11-09 DIAGNOSIS — Z4781 Encounter for orthopedic aftercare following surgical amputation: Secondary | ICD-10-CM | POA: Diagnosis not present

## 2020-11-09 DIAGNOSIS — E785 Hyperlipidemia, unspecified: Secondary | ICD-10-CM | POA: Diagnosis not present

## 2020-11-09 DIAGNOSIS — M479 Spondylosis, unspecified: Secondary | ICD-10-CM | POA: Diagnosis not present

## 2020-11-09 DIAGNOSIS — I70262 Atherosclerosis of native arteries of extremities with gangrene, left leg: Secondary | ICD-10-CM | POA: Diagnosis not present

## 2020-11-09 DIAGNOSIS — E1151 Type 2 diabetes mellitus with diabetic peripheral angiopathy without gangrene: Secondary | ICD-10-CM | POA: Diagnosis not present

## 2020-11-09 DIAGNOSIS — Z89422 Acquired absence of other left toe(s): Secondary | ICD-10-CM | POA: Diagnosis not present

## 2020-11-09 DIAGNOSIS — L97422 Non-pressure chronic ulcer of left heel and midfoot with fat layer exposed: Secondary | ICD-10-CM | POA: Diagnosis not present

## 2020-11-09 DIAGNOSIS — I119 Hypertensive heart disease without heart failure: Secondary | ICD-10-CM | POA: Diagnosis not present

## 2020-11-11 DIAGNOSIS — E785 Hyperlipidemia, unspecified: Secondary | ICD-10-CM | POA: Diagnosis not present

## 2020-11-11 DIAGNOSIS — M479 Spondylosis, unspecified: Secondary | ICD-10-CM | POA: Diagnosis not present

## 2020-11-11 DIAGNOSIS — I214 Non-ST elevation (NSTEMI) myocardial infarction: Secondary | ICD-10-CM | POA: Diagnosis not present

## 2020-11-11 DIAGNOSIS — E1151 Type 2 diabetes mellitus with diabetic peripheral angiopathy without gangrene: Secondary | ICD-10-CM | POA: Diagnosis not present

## 2020-11-11 DIAGNOSIS — L97422 Non-pressure chronic ulcer of left heel and midfoot with fat layer exposed: Secondary | ICD-10-CM | POA: Diagnosis not present

## 2020-11-11 DIAGNOSIS — I119 Hypertensive heart disease without heart failure: Secondary | ICD-10-CM | POA: Diagnosis not present

## 2020-11-11 DIAGNOSIS — Z4781 Encounter for orthopedic aftercare following surgical amputation: Secondary | ICD-10-CM | POA: Diagnosis not present

## 2020-11-11 DIAGNOSIS — R69 Illness, unspecified: Secondary | ICD-10-CM | POA: Diagnosis not present

## 2020-11-11 DIAGNOSIS — Z89422 Acquired absence of other left toe(s): Secondary | ICD-10-CM | POA: Diagnosis not present

## 2020-11-11 DIAGNOSIS — I70262 Atherosclerosis of native arteries of extremities with gangrene, left leg: Secondary | ICD-10-CM | POA: Diagnosis not present

## 2020-11-12 NOTE — Progress Notes (Signed)
[facility] [place] PROGRESS NOTE  ASSESSMENT / PLAN: Mark Dickerson is a 75 y.o. male status post aortobifemoral bypass and left femoral to below-knee popliteal artery bypass on 09/24/20.  Status post left foot debridement 11/01/20.  Follow-up with me in 1 month for wound check.  SUBJECTIVE: Continues to improve.  His left foot appears to be healing slowly.   OBJECTIVE: BP 107/61 (BP Location: Left Arm, Patient Position: Sitting, Cuff Size: Normal)   Pulse 89   Temp 98.1 F (36.7 C)   Resp 20   Ht 5\' 11"  (1.803 m)   Wt 185 lb (83.9 kg)   SpO2 97%   BMI 25.80 kg/m    Left foot continues to improve Patient about the great toe resolved Ulceration about the heels resolving Ulceration over the third partial toe amputation.  There is some granulation tissue.  I am hopeful this will heal.  CBC Latest Ref Rng & Units 11/01/2020 09/28/2020 09/27/2020  WBC 4.0 - 10.5 K/uL - 8.7 8.8  Hemoglobin 13.0 - 17.0 g/dL 12.2(L) 9.4(L) 9.4(L)  Hematocrit 39.0 - 52.0 % 36.0(L) 28.4(L) 27.9(L)  Platelets 150 - 400 K/uL - 176 158     CMP Latest Ref Rng & Units 11/01/2020 10/12/2020 09/28/2020  Glucose 70 - 99 mg/dL 09/30/2020) 774(J) 287(O)  BUN 8 - 23 mg/dL 23 15 16   Creatinine 0.61 - 1.24 mg/dL 676(H 2.09  Sodium 135 - 145 mmol/L 133(L) 134 135  Potassium 3.5 - 5.1 mmol/L 4.1 4.6 3.9  Chloride 98 - 111 mmol/L 98 96 102  CO2 20 - 29 mmol/L - 23 23  Calcium 8.6 - 10.2 mg/dL - 9.9 4.70)  Total Protein 6.5 - 8.1 g/dL - - -  Total Bilirubin 0.3 - 1.2 mg/dL - - -  Alkaline Phos 38 - 126 U/L - - -  AST 15 - 41 U/L - - -  ALT 0 - 44 U/L - - -    Estimated Creatinine Clearance: 56.6 mL/min (by C-G formula based on SCr of 1.2 mg/dL).  LOWER EXTREMITY DOPPLER STUDY   Indications: Claudication, ulceration, gangrene, peripheral artery  disease, and        Claudicant with left 3rd digit toenail bed eschar after  removal,        non-healing. Severe aortoiliac  atherosclerotic disease.        Unsuccessful attempt at kissing iliac stent placement on  08/31/20 in        IR due to inability to catheterize left CIA.   High Risk Factors: Hypertension, hyperlipidemia, Diabetes, current smoker.    Vascular Interventions: Aorto-bifemoral bypass graft and left fem to  below knee             bypass graft 09/24/2020.   Performing Technologist: 09/02/20 RVT     Examination Guidelines: A complete evaluation includes at minimum, Doppler  waveform signals and systolic blood pressure reading at the level of  bilateral  brachial, anterior tibial, and posterior tibial arteries, when vessel  segments  are accessible. Bilateral testing is considered an integral part of a  complete  examination. Photoelectric Plethysmograph (PPG) waveforms and toe systolic  pressure readings are included as required and additional duplex testing  as  needed. Limited examinations for reoccurring indications may be performed  as  noted.     ABI Findings:  +---------+------------------+-----+----------+--------+  Right  Rt Pressure (mmHg)IndexWaveform Comment   +---------+------------------+-----+----------+--------+  Brachial 119                      +---------+------------------+-----+----------+--------+  ATA   71        0.60            +---------+------------------+-----+----------+--------+  PTA   69        0.58 monophasic      +---------+------------------+-----+----------+--------+  DP                monophasic      +---------+------------------+-----+----------+--------+  Great Toe59        0.50            +---------+------------------+-----+----------+--------+   +---------+------------------+-----+----------+-------+  Left   Lt Pressure (mmHg)IndexWaveform Comment   +---------+------------------+-----+----------+-------+  Brachial 118                      +---------+------------------+-----+----------+-------+  ATA   102        0.86            +---------+------------------+-----+----------+-------+  PTA   125        1.05 monophasic      +---------+------------------+-----+----------+-------+  DP                monophasic      +---------+------------------+-----+----------+-------+  Great Toe27        0.23            +---------+------------------+-----+----------+-------+   +-------+-----------+-----------+------------+------------+  ABI/TBIToday's ABIToday's TBIPrevious ABIPrevious TBI  +-------+-----------+-----------+------------+------------+  Right 0.60    0.50    0.50    0.31      +-------+-----------+-----------+------------+------------+  Left  1.05    0.23    0.35    0        +-------+-----------+-----------+------------+------------+       Bilateral ABIs appear increased compared to prior study on 09/18/2020.    Summary:  Right: Resting right ankle-brachial index indicates moderate right lower  extremity arterial disease. The right toe-brachial index is abnormal. RT  great toe pressure = 59 mmHg.   Left: Resting left ankle-brachial index is within normal range. No  evidence of significant left lower extremity arterial disease. The left  toe-brachial index is abnormal. LT Great toe pressure = 27 mmHg.  Although ankle brachial indices are within normal limits (0.95-1.29),  arterial Doppler waveforms at the ankle suggest some component of arterial  occlusive disease.     *See table(s) above for measurements and observations.      Electronically signed by Heath Lark on 10/16/2020 at 4:18:05 PM.    Rande Brunt. Lenell Antu, MD [facility] Phone Number: [phone] 11/12/2020 12:15 PM

## 2020-11-13 ENCOUNTER — Ambulatory Visit (INDEPENDENT_AMBULATORY_CARE_PROVIDER_SITE_OTHER): Payer: Self-pay | Admitting: Vascular Surgery

## 2020-11-13 ENCOUNTER — Other Ambulatory Visit: Payer: Self-pay

## 2020-11-13 ENCOUNTER — Encounter: Payer: Self-pay | Admitting: Vascular Surgery

## 2020-11-13 ENCOUNTER — Other Ambulatory Visit (HOSPITAL_COMMUNITY): Payer: Medicare HMO

## 2020-11-13 VITALS — BP 107/61 | HR 89 | Temp 98.1°F | Resp 20 | Ht 71.0 in | Wt 185.0 lb

## 2020-11-13 DIAGNOSIS — I739 Peripheral vascular disease, unspecified: Secondary | ICD-10-CM

## 2020-11-13 DIAGNOSIS — Z89422 Acquired absence of other left toe(s): Secondary | ICD-10-CM | POA: Diagnosis not present

## 2020-11-15 DIAGNOSIS — M479 Spondylosis, unspecified: Secondary | ICD-10-CM | POA: Diagnosis not present

## 2020-11-15 DIAGNOSIS — E785 Hyperlipidemia, unspecified: Secondary | ICD-10-CM | POA: Diagnosis not present

## 2020-11-15 DIAGNOSIS — R69 Illness, unspecified: Secondary | ICD-10-CM | POA: Diagnosis not present

## 2020-11-15 DIAGNOSIS — I214 Non-ST elevation (NSTEMI) myocardial infarction: Secondary | ICD-10-CM | POA: Diagnosis not present

## 2020-11-15 DIAGNOSIS — L97422 Non-pressure chronic ulcer of left heel and midfoot with fat layer exposed: Secondary | ICD-10-CM | POA: Diagnosis not present

## 2020-11-15 DIAGNOSIS — E1151 Type 2 diabetes mellitus with diabetic peripheral angiopathy without gangrene: Secondary | ICD-10-CM | POA: Diagnosis not present

## 2020-11-15 DIAGNOSIS — I70262 Atherosclerosis of native arteries of extremities with gangrene, left leg: Secondary | ICD-10-CM | POA: Diagnosis not present

## 2020-11-15 DIAGNOSIS — I119 Hypertensive heart disease without heart failure: Secondary | ICD-10-CM | POA: Diagnosis not present

## 2020-11-15 DIAGNOSIS — Z89422 Acquired absence of other left toe(s): Secondary | ICD-10-CM | POA: Diagnosis not present

## 2020-11-15 DIAGNOSIS — Z4781 Encounter for orthopedic aftercare following surgical amputation: Secondary | ICD-10-CM | POA: Diagnosis not present

## 2020-11-16 ENCOUNTER — Other Ambulatory Visit: Payer: Self-pay

## 2020-11-16 MED ORDER — ASPIRIN EC 81 MG PO TBEC: 81.0000 mg | DELAYED_RELEASE_TABLET | Freq: Every day | ORAL | 2 refills | Status: AC

## 2020-11-16 MED ORDER — ATORVASTATIN CALCIUM 40 MG PO TABS: 40.0000 mg | ORAL_TABLET | Freq: Every day | ORAL | 3 refills | Status: DC

## 2020-11-16 MED ORDER — CLOPIDOGREL BISULFATE 75 MG PO TABS: 75.0000 mg | ORAL_TABLET | Freq: Every day | ORAL | 3 refills | Status: AC

## 2020-11-16 NOTE — Progress Notes (Signed)
c 

## 2020-11-19 ENCOUNTER — Telehealth: Payer: Self-pay

## 2020-11-19 NOTE — Telephone Encounter (Signed)
Received call from [facility] about removing the rest of the sutures from the partial toe amputation site.Per RN, site has some slough but looks okay. Discussed with PA, advised she could remove sutures.

## 2020-11-20 ENCOUNTER — Other Ambulatory Visit: Payer: Self-pay | Admitting: Cardiovascular Disease

## 2020-11-20 DIAGNOSIS — I214 Non-ST elevation (NSTEMI) myocardial infarction: Secondary | ICD-10-CM | POA: Diagnosis not present

## 2020-11-20 DIAGNOSIS — R69 Illness, unspecified: Secondary | ICD-10-CM | POA: Diagnosis not present

## 2020-11-20 DIAGNOSIS — I70262 Atherosclerosis of native arteries of extremities with gangrene, left leg: Secondary | ICD-10-CM | POA: Diagnosis not present

## 2020-11-20 DIAGNOSIS — I119 Hypertensive heart disease without heart failure: Secondary | ICD-10-CM | POA: Diagnosis not present

## 2020-11-20 DIAGNOSIS — Z4781 Encounter for orthopedic aftercare following surgical amputation: Secondary | ICD-10-CM | POA: Diagnosis not present

## 2020-11-20 DIAGNOSIS — Z89422 Acquired absence of other left toe(s): Secondary | ICD-10-CM | POA: Diagnosis not present

## 2020-11-20 DIAGNOSIS — I70245 Atherosclerosis of native arteries of left leg with ulceration of other part of foot: Secondary | ICD-10-CM | POA: Diagnosis not present

## 2020-11-20 DIAGNOSIS — T8189XA Other complications of procedures, not elsewhere classified, initial encounter: Secondary | ICD-10-CM | POA: Diagnosis not present

## 2020-11-20 DIAGNOSIS — M479 Spondylosis, unspecified: Secondary | ICD-10-CM | POA: Diagnosis not present

## 2020-11-20 DIAGNOSIS — L97422 Non-pressure chronic ulcer of left heel and midfoot with fat layer exposed: Secondary | ICD-10-CM | POA: Diagnosis not present

## 2020-11-20 DIAGNOSIS — E1151 Type 2 diabetes mellitus with diabetic peripheral angiopathy without gangrene: Secondary | ICD-10-CM | POA: Diagnosis not present

## 2020-11-20 DIAGNOSIS — E785 Hyperlipidemia, unspecified: Secondary | ICD-10-CM | POA: Diagnosis not present

## 2020-11-21 ENCOUNTER — Telehealth: Payer: Self-pay | Admitting: General Practice

## 2020-11-21 DIAGNOSIS — Z89422 Acquired absence of other left toe(s): Secondary | ICD-10-CM | POA: Diagnosis not present

## 2020-11-21 MED ORDER — BENAZEPRIL HCL 10 MG PO TABS: 10.0000 mg | ORAL_TABLET | Freq: Every day | ORAL | 1 refills | Status: DC

## 2020-11-21 NOTE — Telephone Encounter (Signed)
Called and left a detailed voice message for the patient about sending the Benazepril 10 mg daily to [facility] order pharmacy. Asked that if he has any questions please give our office a call.

## 2020-11-21 NOTE — Telephone Encounter (Signed)
*  STAT* If patient is at the pharmacy, call can be transferred to refill team.   1. Which medications need to be refilled? (please list name of each medication and dose if known)  benazepril (LOTENSIN) 10 MG tablet  2. Which pharmacy/location (including street and city if local pharmacy) is medication to be sent to? [facility]- [place], [place] - [place], [place] [address]  3. Do they need a 30 day or 90 day supply? 90

## 2020-11-23 DIAGNOSIS — I10 Essential (primary) hypertension: Secondary | ICD-10-CM | POA: Diagnosis not present

## 2020-11-23 DIAGNOSIS — G629 Polyneuropathy, unspecified: Secondary | ICD-10-CM | POA: Diagnosis not present

## 2020-11-23 DIAGNOSIS — E559 Vitamin D deficiency, unspecified: Secondary | ICD-10-CM | POA: Diagnosis not present

## 2020-11-23 DIAGNOSIS — S91302A Unspecified open wound, left foot, initial encounter: Secondary | ICD-10-CM | POA: Diagnosis not present

## 2020-11-27 DIAGNOSIS — E785 Hyperlipidemia, unspecified: Secondary | ICD-10-CM | POA: Diagnosis not present

## 2020-11-27 DIAGNOSIS — Z4781 Encounter for orthopedic aftercare following surgical amputation: Secondary | ICD-10-CM | POA: Diagnosis not present

## 2020-11-27 DIAGNOSIS — E1151 Type 2 diabetes mellitus with diabetic peripheral angiopathy without gangrene: Secondary | ICD-10-CM | POA: Diagnosis not present

## 2020-11-27 DIAGNOSIS — M479 Spondylosis, unspecified: Secondary | ICD-10-CM | POA: Diagnosis not present

## 2020-11-27 DIAGNOSIS — I70262 Atherosclerosis of native arteries of extremities with gangrene, left leg: Secondary | ICD-10-CM | POA: Diagnosis not present

## 2020-11-27 DIAGNOSIS — R69 Illness, unspecified: Secondary | ICD-10-CM | POA: Diagnosis not present

## 2020-11-27 DIAGNOSIS — I214 Non-ST elevation (NSTEMI) myocardial infarction: Secondary | ICD-10-CM | POA: Diagnosis not present

## 2020-11-27 DIAGNOSIS — Z89422 Acquired absence of other left toe(s): Secondary | ICD-10-CM | POA: Diagnosis not present

## 2020-11-27 DIAGNOSIS — I119 Hypertensive heart disease without heart failure: Secondary | ICD-10-CM | POA: Diagnosis not present

## 2020-11-27 DIAGNOSIS — L97422 Non-pressure chronic ulcer of left heel and midfoot with fat layer exposed: Secondary | ICD-10-CM | POA: Diagnosis not present

## 2020-12-17 NOTE — Progress Notes (Signed)
[facility] [place] PROGRESS NOTE  ASSESSMENT / PLAN: Mark Dickerson is a 75 y.o. male status post aortobifemoral bypass and left femoral to below-knee popliteal artery bypass on 09/24/20.  Status post left foot debridement 11/01/20.  Follow-up with me in 1 month for wound check, ABI and duplex.  SUBJECTIVE: Continues to improve.  His left foot appears to be healing slowly.   OBJECTIVE: BP (!) 150/90 (BP Location: Left Arm, Patient Position: Sitting, Cuff Size: Normal)   Pulse 66   Resp 20   Ht 5\' 11"  (1.803 m)   Wt 187 lb 14.4 oz (85.2 kg)   SpO2 100%   BMI 26.21 kg/m    Left foot continues to improve Ulceration about the heel resolving Ulceration over the third partial toe amputation greatly improved.  CBC Latest Ref Rng & Units 11/01/2020 09/28/2020 09/27/2020  WBC 4.0 - 10.5 K/uL - 8.7 8.8  Hemoglobin 13.0 - 17.0 g/dL 12.2(L) 9.4(L) 9.4(L)  Hematocrit 39.0 - 52.0 % 36.0(L) 28.4(L) 27.9(L)  Platelets 150 - 400 K/uL - 176 158     CMP Latest Ref Rng & Units 11/01/2020 10/12/2020 09/28/2020  Glucose 70 - 99 mg/dL 09/30/2020) 397(Q) 734(L)  BUN 8 - 23 mg/dL 23 15 16   Creatinine 0.61 - 1.24 mg/dL 937(T 0.24  Sodium 135 - 145 mmol/L 133(L) 134 135  Potassium 3.5 - 5.1 mmol/L 4.1 4.6 3.9  Chloride 98 - 111 mmol/L 98 96 102  CO2 20 - 29 mmol/L - 23 23  Calcium 8.6 - 10.2 mg/dL - 9.9 0.97)  Total Protein 6.5 - 8.1 g/dL - - -  Total Bilirubin 0.3 - 1.2 mg/dL - - -  Alkaline Phos 38 - 126 U/L - - -  AST 15 - 41 U/L - - -  ALT 0 - 44 U/L - - -     Mark N. 3.53, MD [facility] Phone Number: [phone] 12/17/2020 5:53 PM

## 2020-12-18 ENCOUNTER — Encounter: Payer: Self-pay | Admitting: Vascular Surgery

## 2020-12-18 ENCOUNTER — Ambulatory Visit: Payer: Medicare HMO | Admitting: Vascular Surgery

## 2020-12-18 ENCOUNTER — Other Ambulatory Visit: Payer: Self-pay

## 2020-12-18 VITALS — BP 150/90 | HR 66 | Resp 20 | Ht 71.0 in | Wt 187.9 lb

## 2020-12-18 DIAGNOSIS — I739 Peripheral vascular disease, unspecified: Secondary | ICD-10-CM

## 2020-12-19 ENCOUNTER — Other Ambulatory Visit: Payer: Self-pay

## 2020-12-19 DIAGNOSIS — I739 Peripheral vascular disease, unspecified: Secondary | ICD-10-CM

## 2020-12-24 DIAGNOSIS — E119 Type 2 diabetes mellitus without complications: Secondary | ICD-10-CM | POA: Diagnosis not present

## 2020-12-31 ENCOUNTER — Other Ambulatory Visit: Payer: Self-pay

## 2021-01-01 MED ORDER — METOPROLOL SUCCINATE ER 25 MG PO TB24: 12.5000 mg | ORAL_TABLET | Freq: Every day | ORAL | 2 refills | Status: DC

## 2021-01-01 NOTE — Telephone Encounter (Signed)
CALLED PHARMACY AND PT WANTS TO FILL AT THIS PHARMACY NOW. WILL SEND REFILL

## 2021-01-09 DIAGNOSIS — Z6826 Body mass index (BMI) 26.0-26.9, adult: Secondary | ICD-10-CM | POA: Diagnosis not present

## 2021-01-09 DIAGNOSIS — I70262 Atherosclerosis of native arteries of extremities with gangrene, left leg: Secondary | ICD-10-CM | POA: Diagnosis not present

## 2021-01-09 DIAGNOSIS — Z1211 Encounter for screening for malignant neoplasm of colon: Secondary | ICD-10-CM | POA: Diagnosis not present

## 2021-01-09 DIAGNOSIS — J449 Chronic obstructive pulmonary disease, unspecified: Secondary | ICD-10-CM | POA: Diagnosis not present

## 2021-01-09 DIAGNOSIS — R69 Illness, unspecified: Secondary | ICD-10-CM | POA: Diagnosis not present

## 2021-01-09 DIAGNOSIS — I214 Non-ST elevation (NSTEMI) myocardial infarction: Secondary | ICD-10-CM | POA: Diagnosis not present

## 2021-01-09 DIAGNOSIS — I10 Essential (primary) hypertension: Secondary | ICD-10-CM | POA: Diagnosis not present

## 2021-01-20 NOTE — Progress Notes (Signed)
[facility] [place] PROGRESS NOTE  ASSESSMENT / PLAN: Mark Dickerson is a 75 y.o. male status post aortobifemoral bypass and left femoral to below-knee popliteal artery bypass on 09/24/20.  Status post left foot debridement 11/01/20. Unfortunately, his L fem-pop bypass has thrombosed. Fortunately, he has healed his foot.  Follow-up with me or PA in six months for ABF surveillance with ABI.   SUBJECTIVE: Continues to improve.    OBJECTIVE: BP (!) 199/97 (BP Location: Right Arm, Patient Position: Sitting, Cuff Size: Normal)   Pulse 73   Temp 98.5 F (36.9 C) (Temporal)   Resp 14   Ht 5\' 11"  (1.803 m)   Wt 190 lb 4.8 oz (86.3 kg)   SpO2 98%   BMI 26.54 kg/m    Left foot continues to improve Ulceration about the heel resolved Ulceration over the third partial toe amputation resolved  CBC Latest Ref Rng & Units 11/01/2020 09/28/2020 09/27/2020  WBC 4.0 - 10.5 K/uL - 8.7 8.8  Hemoglobin 13.0 - 17.0 g/dL 12.2(L) 9.4(L) 9.4(L)  Hematocrit 39.0 - 52.0 % 36.0(L) 28.4(L) 27.9(L)  Platelets 150 - 400 K/uL - 176 158     CMP Latest Ref Rng & Units 11/01/2020 10/12/2020 09/28/2020  Glucose 70 - 99 mg/dL 09/30/2020) 725(D) 664(Q)  BUN 8 - 23 mg/dL 23 15 16   Creatinine 0.61 - 1.24 mg/dL 034(V 4.25  Sodium 135 - 145 mmol/L 133(L) 134 135  Potassium 3.5 - 5.1 mmol/L 4.1 4.6 3.9  Chloride 98 - 111 mmol/L 98 96 102  CO2 20 - 29 mmol/L - 23 23  Calcium 8.6 - 10.2 mg/dL - 9.9 9.56)  Total Protein 6.5 - 8.1 g/dL - - -  Total Bilirubin 0.3 - 1.2 mg/dL - - -  Alkaline Phos 38 - 126 U/L - - -  AST 15 - 41 U/L - - -  ALT 0 - 44 U/L - - -     Rithwik N. 3.87, MD [facility] Phone Number: [phone] 01/20/2021 3:38 PM

## 2021-01-21 ENCOUNTER — Other Ambulatory Visit: Payer: Self-pay | Admitting: Student

## 2021-01-21 NOTE — Telephone Encounter (Signed)
This is Dr. Harding's pt. °

## 2021-01-22 ENCOUNTER — Ambulatory Visit: Payer: Medicare HMO | Admitting: Vascular Surgery

## 2021-01-22 ENCOUNTER — Ambulatory Visit (HOSPITAL_COMMUNITY)
Admission: RE | Admit: 2021-01-22 | Discharge: 2021-01-22 | Disposition: A | Discharge status: Home / Self Care | Payer: Medicare HMO | Source: Ambulatory Visit | Attending: Vascular Surgery | Admitting: Vascular Surgery

## 2021-01-22 ENCOUNTER — Ambulatory Visit (INDEPENDENT_AMBULATORY_CARE_PROVIDER_SITE_OTHER)
Admission: RE | Admit: 2021-01-22 | Discharge: 2021-01-22 | Disposition: A | Discharge status: Home / Self Care | Payer: Medicare HMO | Source: Ambulatory Visit | Attending: Vascular Surgery | Admitting: Vascular Surgery

## 2021-01-22 ENCOUNTER — Other Ambulatory Visit: Payer: Self-pay

## 2021-01-22 ENCOUNTER — Encounter: Payer: Self-pay | Admitting: Vascular Surgery

## 2021-01-22 VITALS — BP 199/97 | HR 73 | Temp 98.5°F | Resp 14 | Ht 71.0 in | Wt 190.3 lb

## 2021-01-22 DIAGNOSIS — I739 Peripheral vascular disease, unspecified: Secondary | ICD-10-CM | POA: Insufficient documentation

## 2021-01-24 ENCOUNTER — Other Ambulatory Visit: Payer: Self-pay

## 2021-01-24 DIAGNOSIS — I739 Peripheral vascular disease, unspecified: Secondary | ICD-10-CM

## 2021-01-28 ENCOUNTER — Ambulatory Visit: Payer: Medicare HMO | Admitting: Cardiology

## 2021-02-25 DIAGNOSIS — R69 Illness, unspecified: Secondary | ICD-10-CM | POA: Diagnosis not present

## 2021-02-25 DIAGNOSIS — J449 Chronic obstructive pulmonary disease, unspecified: Secondary | ICD-10-CM | POA: Diagnosis not present

## 2021-02-25 DIAGNOSIS — E1169 Type 2 diabetes mellitus with other specified complication: Secondary | ICD-10-CM | POA: Diagnosis not present

## 2021-02-25 DIAGNOSIS — N400 Enlarged prostate without lower urinary tract symptoms: Secondary | ICD-10-CM | POA: Diagnosis not present

## 2021-02-25 DIAGNOSIS — G629 Polyneuropathy, unspecified: Secondary | ICD-10-CM | POA: Diagnosis not present

## 2021-02-25 DIAGNOSIS — I10 Essential (primary) hypertension: Secondary | ICD-10-CM | POA: Diagnosis not present

## 2021-02-25 DIAGNOSIS — E559 Vitamin D deficiency, unspecified: Secondary | ICD-10-CM | POA: Diagnosis not present

## 2021-02-25 DIAGNOSIS — Z6826 Body mass index (BMI) 26.0-26.9, adult: Secondary | ICD-10-CM | POA: Diagnosis not present

## 2021-02-25 DIAGNOSIS — E7800 Pure hypercholesterolemia, unspecified: Secondary | ICD-10-CM | POA: Diagnosis not present

## 2021-06-12 DIAGNOSIS — Z1331 Encounter for screening for depression: Secondary | ICD-10-CM | POA: Diagnosis not present

## 2021-06-12 DIAGNOSIS — R69 Illness, unspecified: Secondary | ICD-10-CM | POA: Diagnosis not present

## 2021-06-12 DIAGNOSIS — Z9181 History of falling: Secondary | ICD-10-CM | POA: Diagnosis not present

## 2021-06-12 DIAGNOSIS — Z139 Encounter for screening, unspecified: Secondary | ICD-10-CM | POA: Diagnosis not present

## 2021-06-12 DIAGNOSIS — E1169 Type 2 diabetes mellitus with other specified complication: Secondary | ICD-10-CM | POA: Diagnosis not present

## 2021-06-12 DIAGNOSIS — J449 Chronic obstructive pulmonary disease, unspecified: Secondary | ICD-10-CM | POA: Diagnosis not present

## 2021-06-12 DIAGNOSIS — N400 Enlarged prostate without lower urinary tract symptoms: Secondary | ICD-10-CM | POA: Diagnosis not present

## 2021-06-12 DIAGNOSIS — E7800 Pure hypercholesterolemia, unspecified: Secondary | ICD-10-CM | POA: Diagnosis not present

## 2021-06-12 DIAGNOSIS — G629 Polyneuropathy, unspecified: Secondary | ICD-10-CM | POA: Diagnosis not present

## 2021-06-12 DIAGNOSIS — I10 Essential (primary) hypertension: Secondary | ICD-10-CM | POA: Diagnosis not present

## 2021-06-27 DIAGNOSIS — Z125 Encounter for screening for malignant neoplasm of prostate: Secondary | ICD-10-CM | POA: Diagnosis not present

## 2021-06-27 DIAGNOSIS — J449 Chronic obstructive pulmonary disease, unspecified: Secondary | ICD-10-CM | POA: Diagnosis not present

## 2021-06-27 DIAGNOSIS — N400 Enlarged prostate without lower urinary tract symptoms: Secondary | ICD-10-CM | POA: Diagnosis not present

## 2021-06-27 DIAGNOSIS — E1169 Type 2 diabetes mellitus with other specified complication: Secondary | ICD-10-CM | POA: Diagnosis not present

## 2021-06-27 DIAGNOSIS — E559 Vitamin D deficiency, unspecified: Secondary | ICD-10-CM | POA: Diagnosis not present

## 2021-06-27 DIAGNOSIS — I10 Essential (primary) hypertension: Secondary | ICD-10-CM | POA: Diagnosis not present

## 2021-06-27 DIAGNOSIS — R69 Illness, unspecified: Secondary | ICD-10-CM | POA: Diagnosis not present

## 2021-06-27 DIAGNOSIS — Z6826 Body mass index (BMI) 26.0-26.9, adult: Secondary | ICD-10-CM | POA: Diagnosis not present

## 2021-06-27 DIAGNOSIS — E7800 Pure hypercholesterolemia, unspecified: Secondary | ICD-10-CM | POA: Diagnosis not present

## 2021-06-27 DIAGNOSIS — G629 Polyneuropathy, unspecified: Secondary | ICD-10-CM | POA: Diagnosis not present

## 2021-07-17 DIAGNOSIS — R062 Wheezing: Secondary | ICD-10-CM | POA: Diagnosis not present

## 2021-07-17 DIAGNOSIS — I10 Essential (primary) hypertension: Secondary | ICD-10-CM | POA: Diagnosis not present

## 2021-07-17 DIAGNOSIS — J449 Chronic obstructive pulmonary disease, unspecified: Secondary | ICD-10-CM | POA: Diagnosis not present

## 2021-07-18 IMAGING — RF DG ANGIO/EXT/BI OR
1 series · 14 of 16 positions shown · non-contrast
Comparison: Prior arteriography on 08/31/2020

CLINICAL DATA: Severe occlusive disease of bilateral iliac arteries
and left foot gangrene. Aortobifemoral bypass grafting and left
femoral to popliteal bypass graft placement.

EXAM:
DG ANGIO/EXT/BI OR

[Series 1: unknown protocol · 0.20mm/px · 4 acquisitions, 14 frames shown]
[im 1/4]
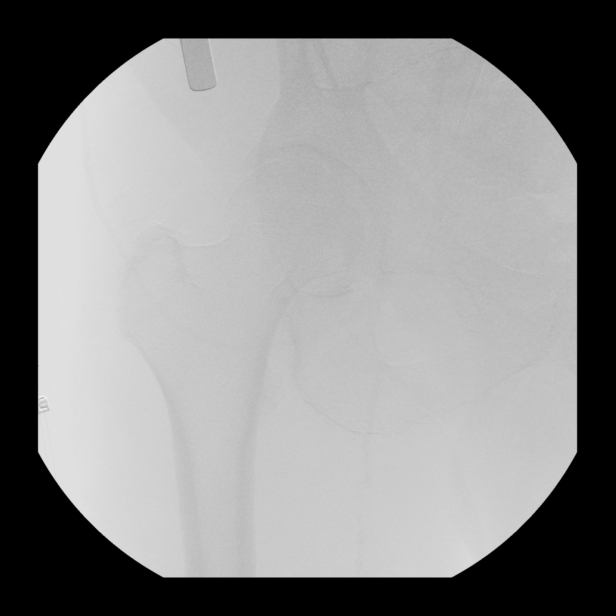
[im 1/4]
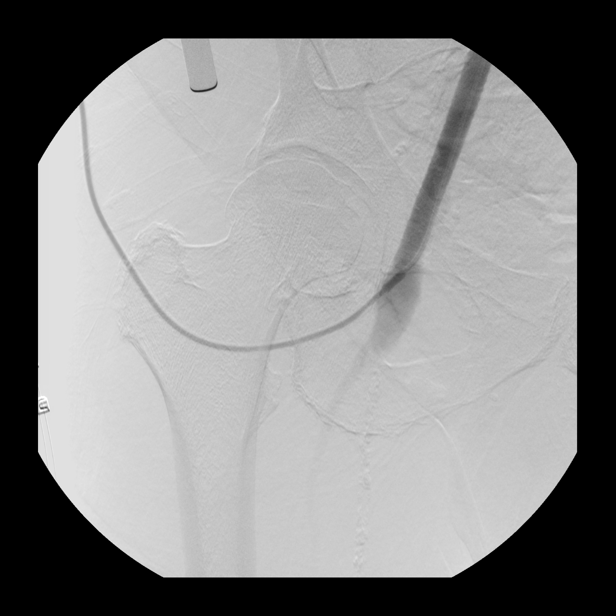
[im 1/4]
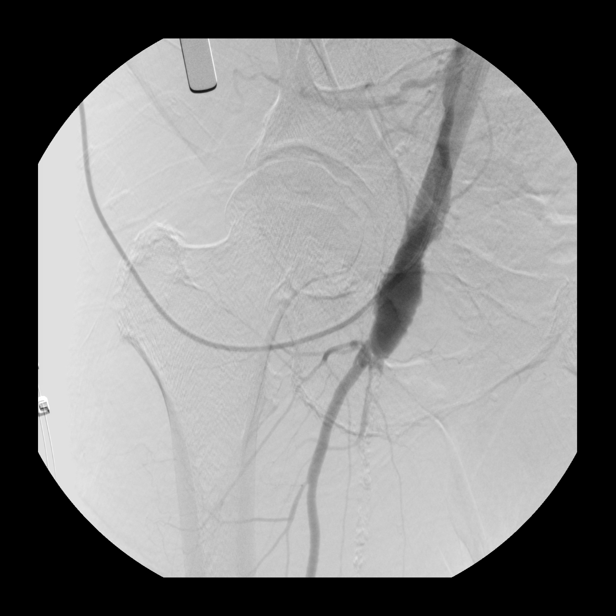
[im 2/4]
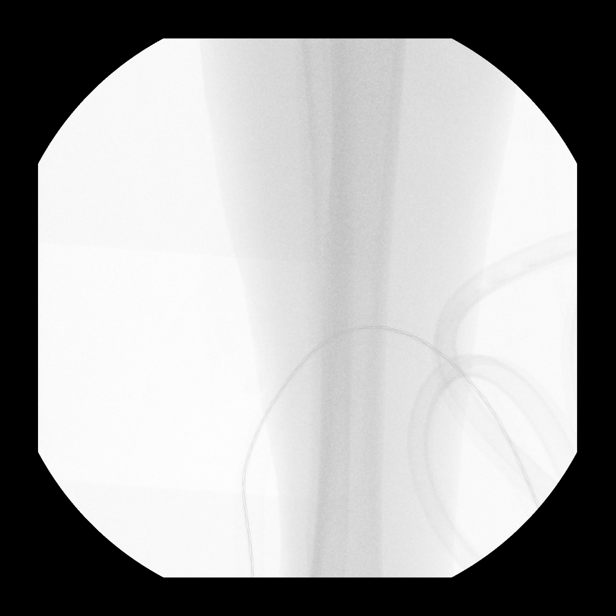
[im 2/4]
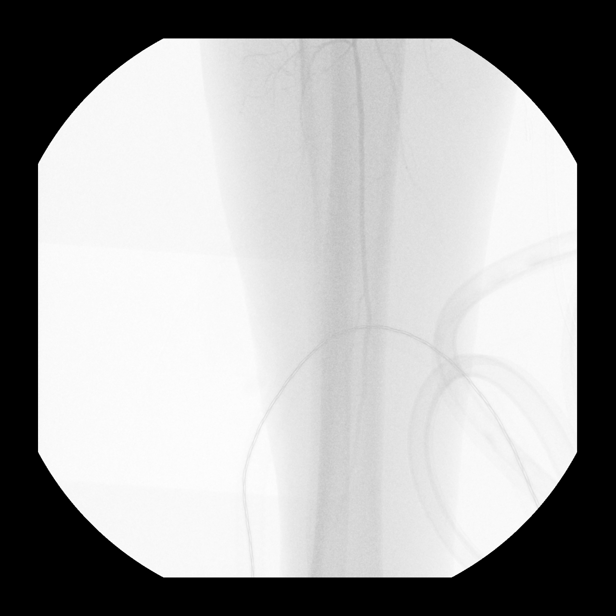
[im 2/4]
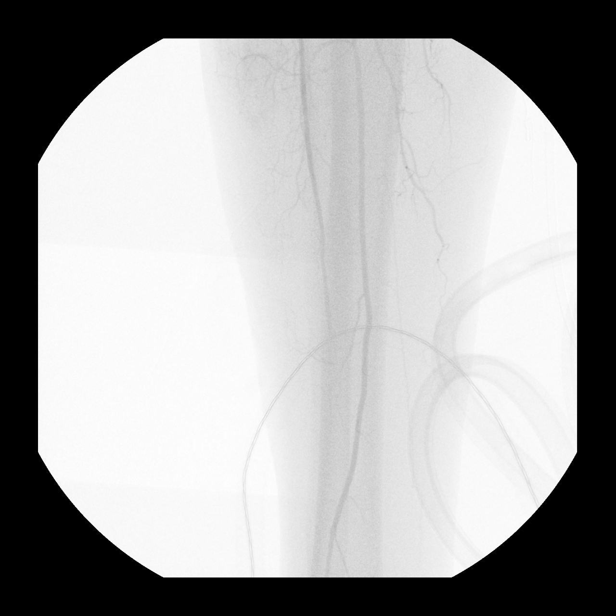
[im 2/4]
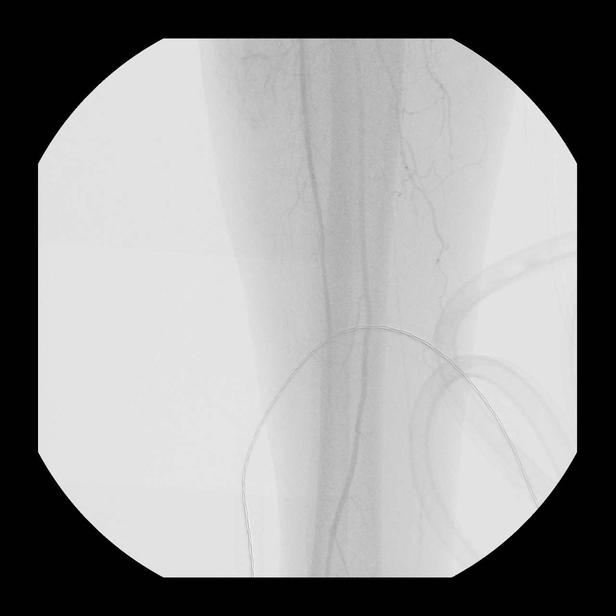
[im 3/4]
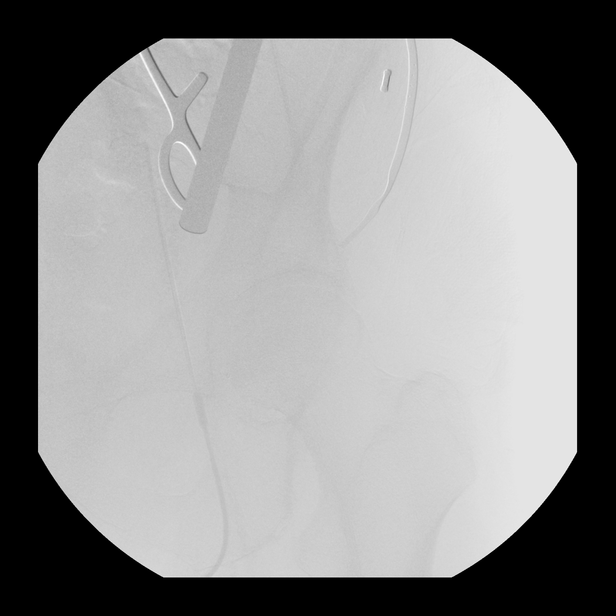
[im 3/4]
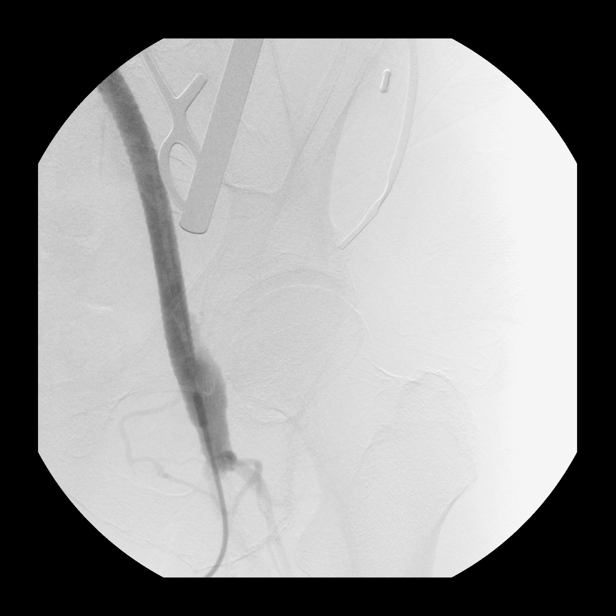
[im 3/4]
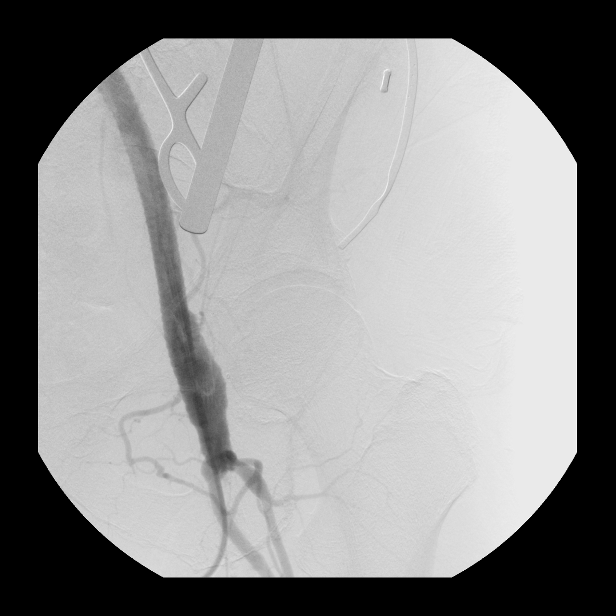
[im 4/4]
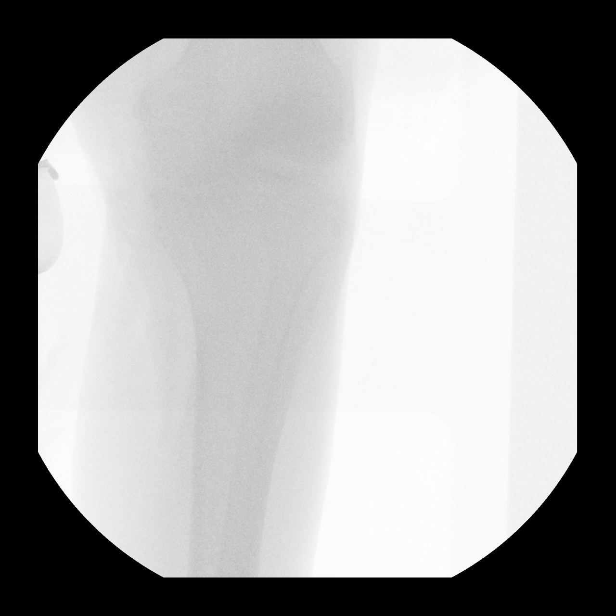
[im 4/4]
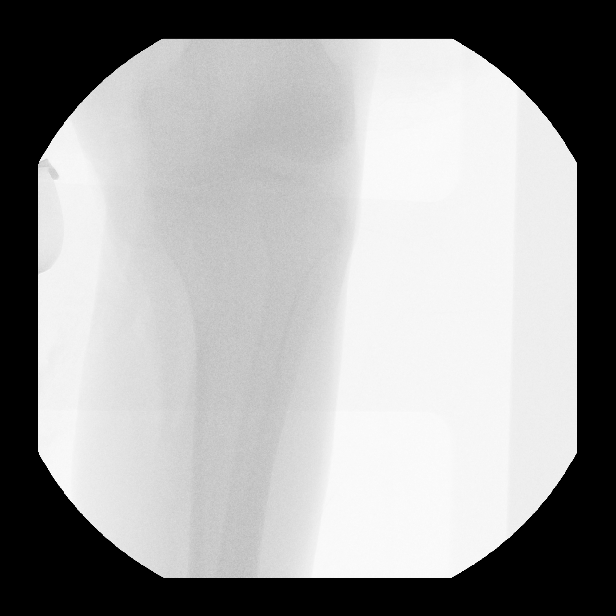
[im 4/4]
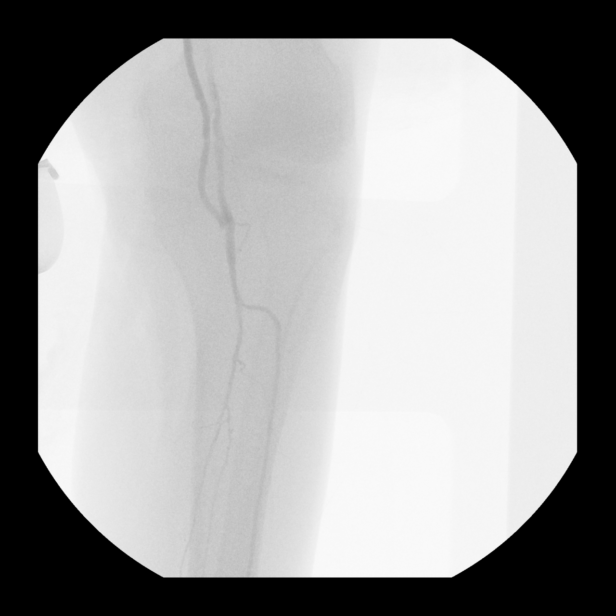
[im 4/4]
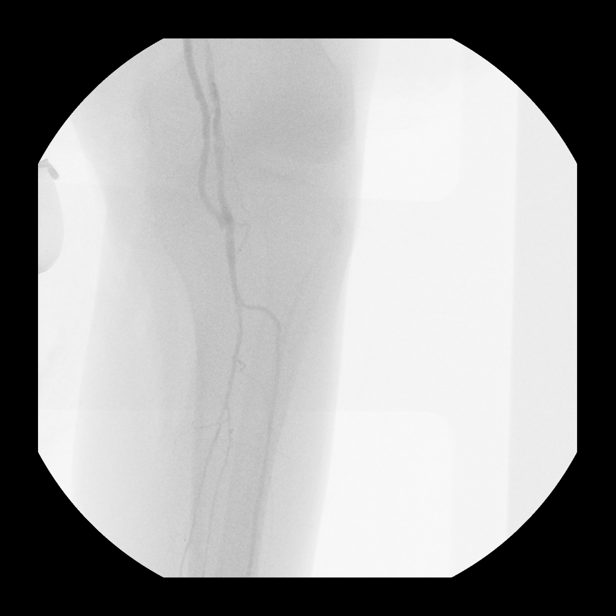

[14 of 16 positions shown; findings below may reference images not displayed]

FINDINGS: Intraoperative imaging was obtained with a C-arm. Images show the
distal limb a right aortobifemoral bypass graft with anastomosis to
the distal common femoral artery. The profunda femoral artery is
patent and the native SFA appears chronically occluded. Subsequent
image appears to be a near lateral projection of the lower leg,
presumably the right. There is a patent tibial vessel noted which
may be the anterior tibial or peroneal artery and partial
visualization of the posterior tibial artery.

Additional imaging demonstrates patent left-sided limb of the
aortobifemoral graft with anastomosis to the distal left common
femoral artery. Patent profunda femoral branches visualized. The
native left SFA is occluded. Subsequent image shows the distal and
of a femoral bypass graft with anastomosis to the popliteal artery
below the knee. Partially visualized patent anterior tibial,
tibioperoneal and small caliber posterior tibial and peroneal
arteries to roughly the mid calf level.
IMPRESSION: Imaging obtained during aortobifemoral bypass grafting and left
femoral to below-knee popliteal bypass grafting. Bilateral native
SFA occlusion.

## 2021-07-18 IMAGING — DX DG CHEST 1V PORT
1 series · 1 of 1 positions shown · non-contrast
Comparison: Portable exam 2854 hours without priors for comparison

CLINICAL DATA: Aortoiliac occlusive disease, postop

EXAM:
PORTABLE CHEST 1 VIEW

[chest ap]
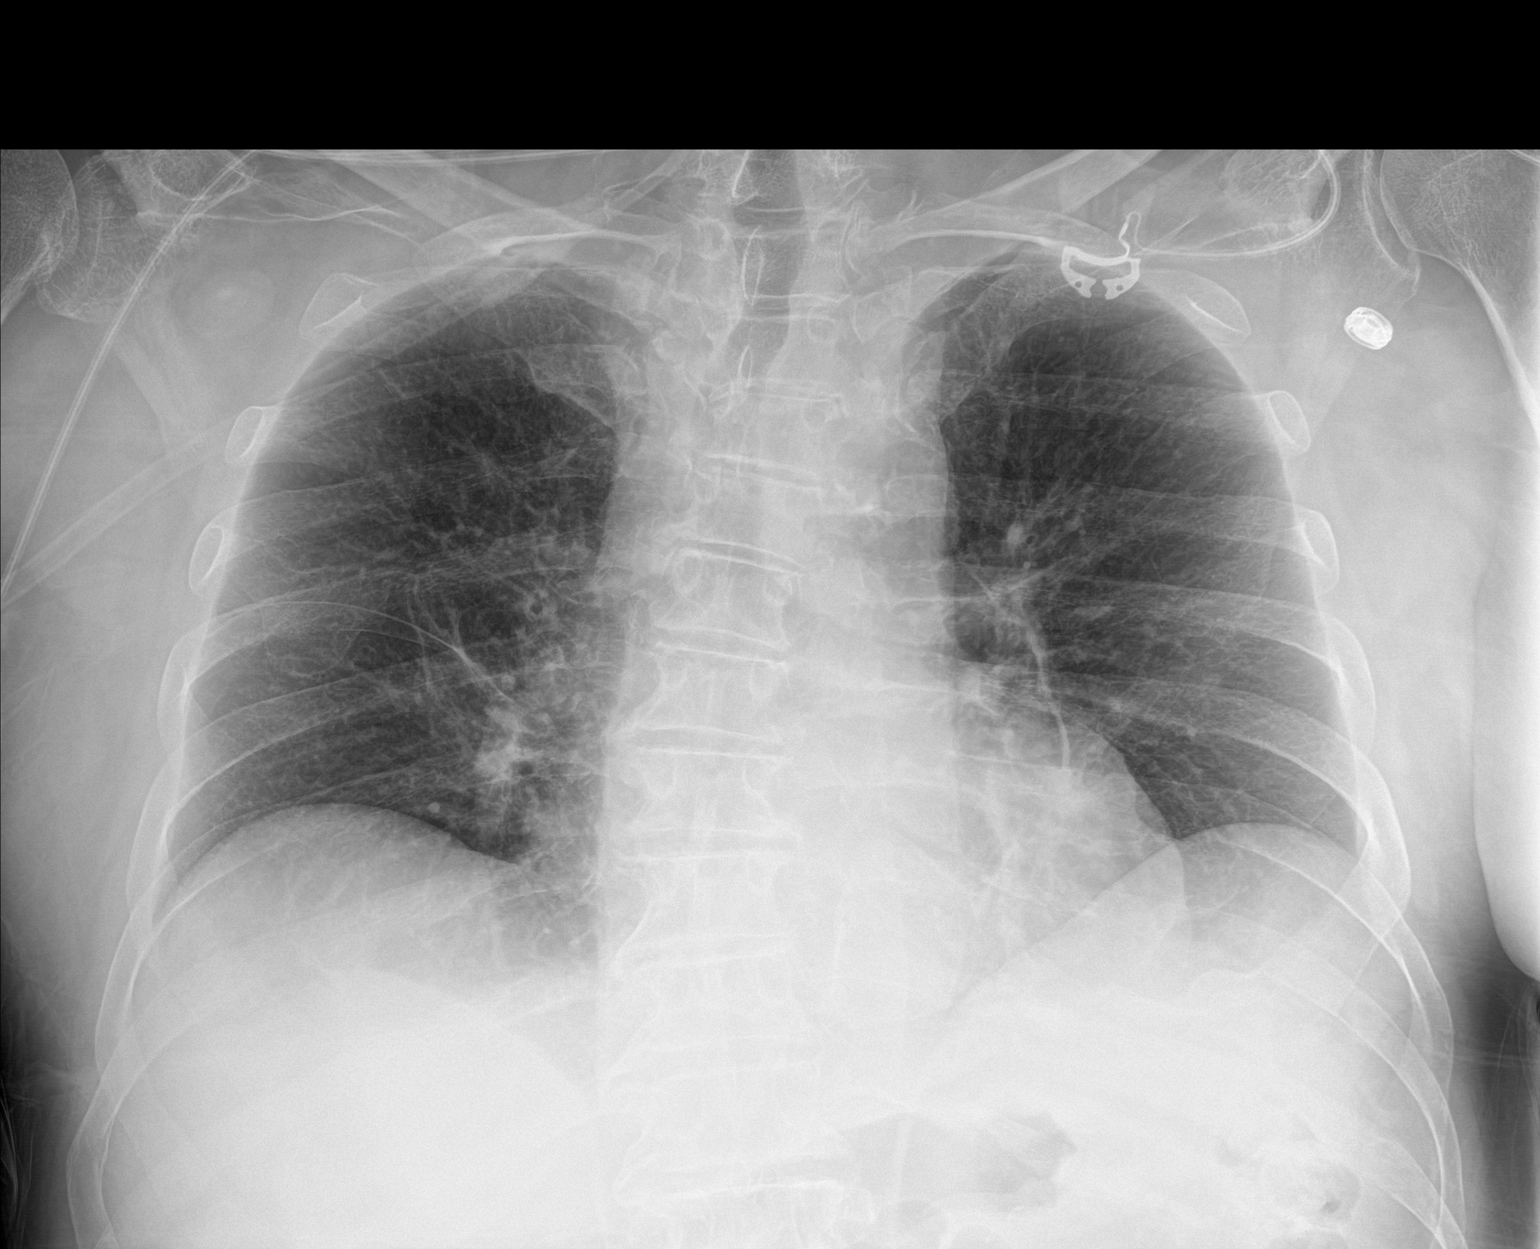

[1 of 1 positions shown; findings below may reference images not displayed]

FINDINGS: Normal heart size, mediastinal contours, and pulmonary vascularity.

Scattered subsegmental atelectasis and peribronchial thickening
bilaterally.

No acute infiltrate, pleural effusion, or pneumothorax.

Biconvex thoracic scoliosis.
IMPRESSION: Bronchitic changes with scattered atelectasis in both lungs.

## 2021-07-19 IMAGING — DX DG CHEST 1V PORT
1 series · 1 of 1 positions shown · non-contrast
Comparison: September 24, 2020

CLINICAL DATA: Shortness of breath

EXAM:
PORTABLE CHEST 1 VIEW

[chest ap]
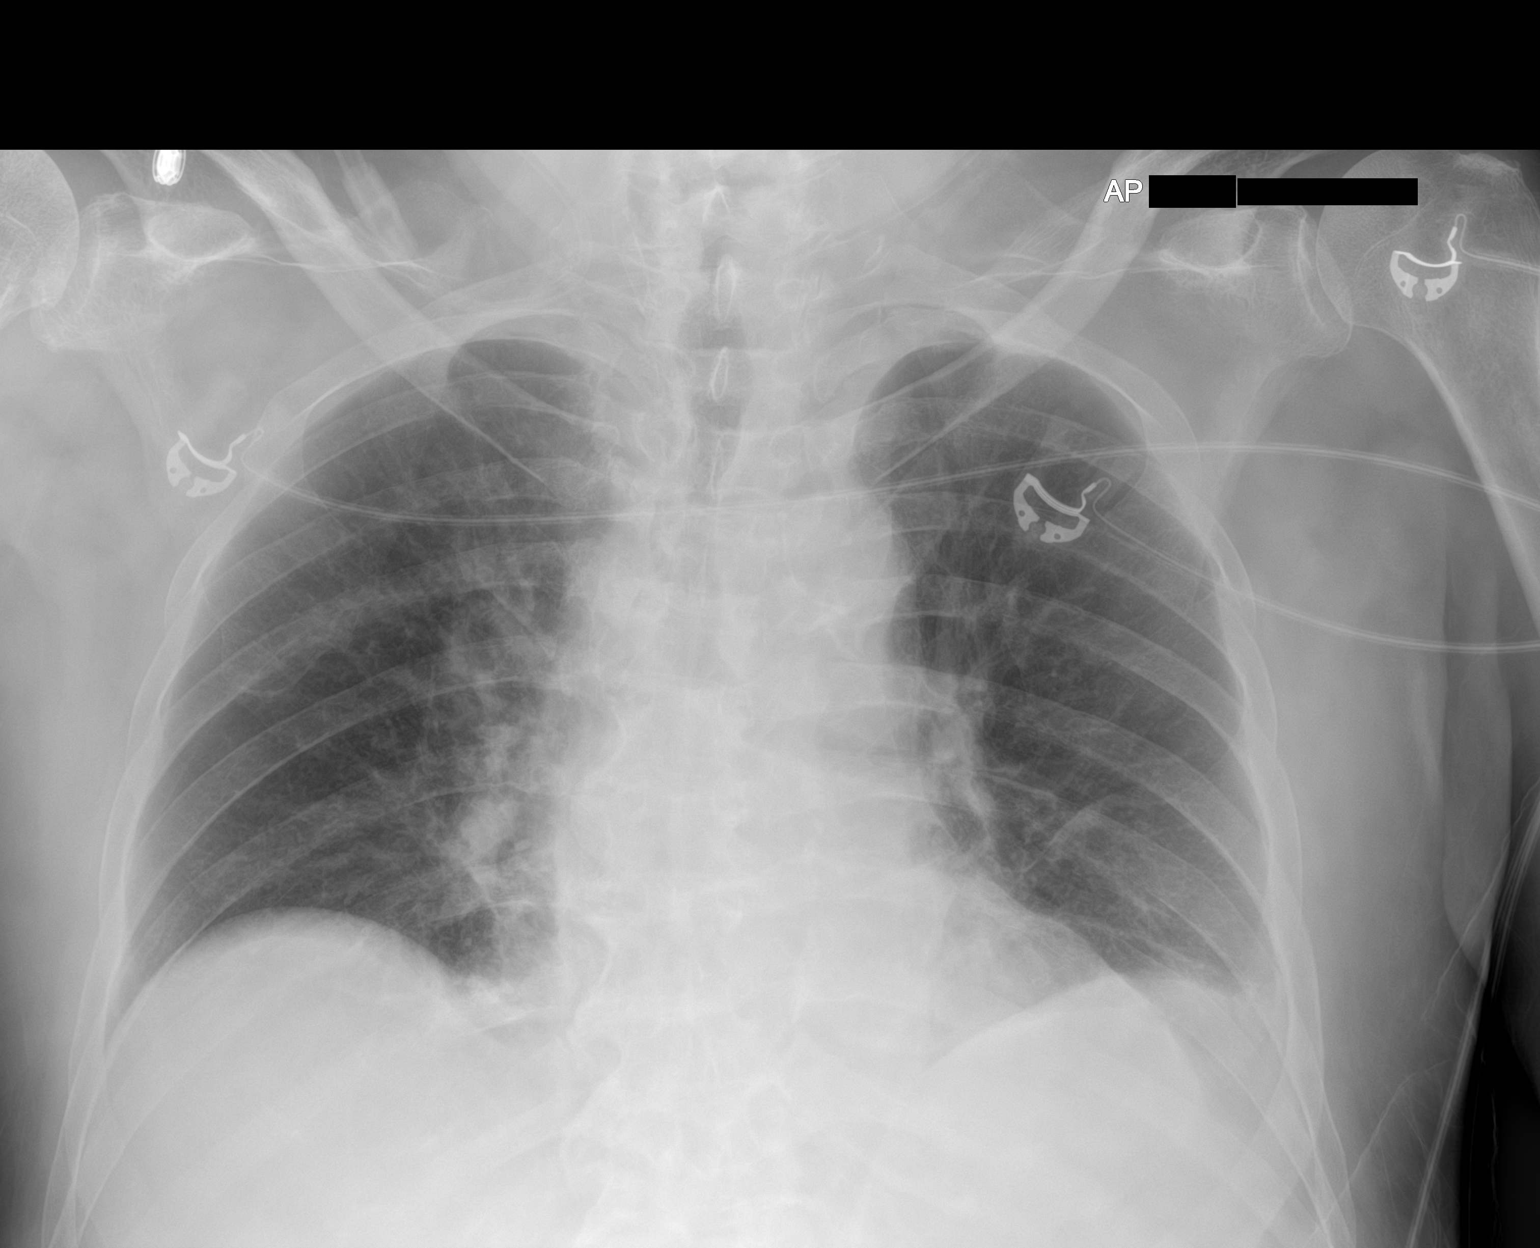

[1 of 1 positions shown; findings below may reference images not displayed]

FINDINGS: There is mild left base atelectasis. The lungs elsewhere are clear.
The heart size and pulmonary vascularity are normal. No adenopathy.
No bone lesions.
IMPRESSION: Left base atelectasis.  Lungs elsewhere clear.  Heart size normal.

## 2021-08-12 DIAGNOSIS — I10 Essential (primary) hypertension: Secondary | ICD-10-CM | POA: Diagnosis not present

## 2021-09-07 DIAGNOSIS — E7800 Pure hypercholesterolemia, unspecified: Secondary | ICD-10-CM | POA: Diagnosis not present

## 2021-09-07 DIAGNOSIS — E559 Vitamin D deficiency, unspecified: Secondary | ICD-10-CM | POA: Diagnosis not present

## 2021-09-07 DIAGNOSIS — J449 Chronic obstructive pulmonary disease, unspecified: Secondary | ICD-10-CM | POA: Diagnosis not present

## 2021-09-07 DIAGNOSIS — E1169 Type 2 diabetes mellitus with other specified complication: Secondary | ICD-10-CM | POA: Diagnosis not present

## 2021-09-07 DIAGNOSIS — E785 Hyperlipidemia, unspecified: Secondary | ICD-10-CM | POA: Diagnosis not present

## 2021-09-07 DIAGNOSIS — I10 Essential (primary) hypertension: Secondary | ICD-10-CM | POA: Diagnosis not present

## 2021-10-05 DIAGNOSIS — J449 Chronic obstructive pulmonary disease, unspecified: Secondary | ICD-10-CM | POA: Diagnosis not present

## 2021-10-05 DIAGNOSIS — E1169 Type 2 diabetes mellitus with other specified complication: Secondary | ICD-10-CM | POA: Diagnosis not present

## 2021-10-05 DIAGNOSIS — E785 Hyperlipidemia, unspecified: Secondary | ICD-10-CM | POA: Diagnosis not present

## 2021-10-05 DIAGNOSIS — E7800 Pure hypercholesterolemia, unspecified: Secondary | ICD-10-CM | POA: Diagnosis not present

## 2021-10-05 DIAGNOSIS — I10 Essential (primary) hypertension: Secondary | ICD-10-CM | POA: Diagnosis not present

## 2021-10-05 DIAGNOSIS — E559 Vitamin D deficiency, unspecified: Secondary | ICD-10-CM | POA: Diagnosis not present

## 2021-10-05 DIAGNOSIS — D751 Secondary polycythemia: Secondary | ICD-10-CM | POA: Diagnosis not present

## 2021-10-07 DIAGNOSIS — E1169 Type 2 diabetes mellitus with other specified complication: Secondary | ICD-10-CM | POA: Diagnosis not present

## 2022-01-08 DIAGNOSIS — H353132 Nonexudative age-related macular degeneration, bilateral, intermediate dry stage: Secondary | ICD-10-CM | POA: Diagnosis not present

## 2022-01-09 DIAGNOSIS — E559 Vitamin D deficiency, unspecified: Secondary | ICD-10-CM | POA: Diagnosis not present

## 2022-01-09 DIAGNOSIS — E7800 Pure hypercholesterolemia, unspecified: Secondary | ICD-10-CM | POA: Diagnosis not present

## 2022-01-09 DIAGNOSIS — Z6829 Body mass index (BMI) 29.0-29.9, adult: Secondary | ICD-10-CM | POA: Diagnosis not present

## 2022-01-09 DIAGNOSIS — E1169 Type 2 diabetes mellitus with other specified complication: Secondary | ICD-10-CM | POA: Diagnosis not present

## 2022-01-09 DIAGNOSIS — E785 Hyperlipidemia, unspecified: Secondary | ICD-10-CM | POA: Diagnosis not present

## 2022-01-09 DIAGNOSIS — Z87891 Personal history of nicotine dependence: Secondary | ICD-10-CM | POA: Diagnosis not present

## 2022-01-09 DIAGNOSIS — I10 Essential (primary) hypertension: Secondary | ICD-10-CM | POA: Diagnosis not present

## 2022-01-09 DIAGNOSIS — D751 Secondary polycythemia: Secondary | ICD-10-CM | POA: Diagnosis not present

## 2022-01-09 DIAGNOSIS — J449 Chronic obstructive pulmonary disease, unspecified: Secondary | ICD-10-CM | POA: Diagnosis not present

## 2022-04-16 DIAGNOSIS — J449 Chronic obstructive pulmonary disease, unspecified: Secondary | ICD-10-CM | POA: Diagnosis not present

## 2022-04-16 DIAGNOSIS — E1169 Type 2 diabetes mellitus with other specified complication: Secondary | ICD-10-CM | POA: Diagnosis not present

## 2022-04-16 DIAGNOSIS — Z23 Encounter for immunization: Secondary | ICD-10-CM | POA: Diagnosis not present

## 2022-04-16 DIAGNOSIS — Z125 Encounter for screening for malignant neoplasm of prostate: Secondary | ICD-10-CM | POA: Diagnosis not present

## 2022-04-16 DIAGNOSIS — I10 Essential (primary) hypertension: Secondary | ICD-10-CM | POA: Diagnosis not present

## 2022-04-16 DIAGNOSIS — E7800 Pure hypercholesterolemia, unspecified: Secondary | ICD-10-CM | POA: Diagnosis not present

## 2022-04-16 DIAGNOSIS — D751 Secondary polycythemia: Secondary | ICD-10-CM | POA: Diagnosis not present

## 2022-04-16 DIAGNOSIS — E559 Vitamin D deficiency, unspecified: Secondary | ICD-10-CM | POA: Diagnosis not present

## 2022-04-16 DIAGNOSIS — E785 Hyperlipidemia, unspecified: Secondary | ICD-10-CM | POA: Diagnosis not present

## 2022-07-17 DIAGNOSIS — E785 Hyperlipidemia, unspecified: Secondary | ICD-10-CM | POA: Diagnosis not present

## 2022-07-17 DIAGNOSIS — D751 Secondary polycythemia: Secondary | ICD-10-CM | POA: Diagnosis not present

## 2022-07-17 DIAGNOSIS — E1169 Type 2 diabetes mellitus with other specified complication: Secondary | ICD-10-CM | POA: Diagnosis not present

## 2022-07-17 DIAGNOSIS — I10 Essential (primary) hypertension: Secondary | ICD-10-CM | POA: Diagnosis not present

## 2022-07-17 DIAGNOSIS — E559 Vitamin D deficiency, unspecified: Secondary | ICD-10-CM | POA: Diagnosis not present

## 2022-07-17 DIAGNOSIS — E7800 Pure hypercholesterolemia, unspecified: Secondary | ICD-10-CM | POA: Diagnosis not present

## 2022-07-17 DIAGNOSIS — J449 Chronic obstructive pulmonary disease, unspecified: Secondary | ICD-10-CM | POA: Diagnosis not present

## 2022-10-16 DIAGNOSIS — E7800 Pure hypercholesterolemia, unspecified: Secondary | ICD-10-CM | POA: Diagnosis not present

## 2022-10-16 DIAGNOSIS — J449 Chronic obstructive pulmonary disease, unspecified: Secondary | ICD-10-CM | POA: Diagnosis not present

## 2022-10-16 DIAGNOSIS — E559 Vitamin D deficiency, unspecified: Secondary | ICD-10-CM | POA: Diagnosis not present

## 2022-10-16 DIAGNOSIS — Z87891 Personal history of nicotine dependence: Secondary | ICD-10-CM | POA: Diagnosis not present

## 2022-10-16 DIAGNOSIS — E1169 Type 2 diabetes mellitus with other specified complication: Secondary | ICD-10-CM | POA: Diagnosis not present

## 2022-10-16 DIAGNOSIS — M25561 Pain in right knee: Secondary | ICD-10-CM | POA: Diagnosis not present

## 2022-10-16 DIAGNOSIS — E785 Hyperlipidemia, unspecified: Secondary | ICD-10-CM | POA: Diagnosis not present

## 2022-10-16 DIAGNOSIS — Z6829 Body mass index (BMI) 29.0-29.9, adult: Secondary | ICD-10-CM | POA: Diagnosis not present

## 2023-01-12 DIAGNOSIS — H353132 Nonexudative age-related macular degeneration, bilateral, intermediate dry stage: Secondary | ICD-10-CM | POA: Diagnosis not present

## 2023-01-19 DIAGNOSIS — E1169 Type 2 diabetes mellitus with other specified complication: Secondary | ICD-10-CM | POA: Diagnosis not present

## 2023-01-19 DIAGNOSIS — I10 Essential (primary) hypertension: Secondary | ICD-10-CM | POA: Diagnosis not present

## 2023-01-19 DIAGNOSIS — E785 Hyperlipidemia, unspecified: Secondary | ICD-10-CM | POA: Diagnosis not present

## 2023-01-19 DIAGNOSIS — Z139 Encounter for screening, unspecified: Secondary | ICD-10-CM | POA: Diagnosis not present

## 2023-01-19 DIAGNOSIS — E7800 Pure hypercholesterolemia, unspecified: Secondary | ICD-10-CM | POA: Diagnosis not present

## 2023-01-19 DIAGNOSIS — J449 Chronic obstructive pulmonary disease, unspecified: Secondary | ICD-10-CM | POA: Diagnosis not present

## 2023-04-10 DIAGNOSIS — E871 Hypo-osmolality and hyponatremia: Secondary | ICD-10-CM | POA: Diagnosis not present

## 2023-04-10 DIAGNOSIS — R58 Hemorrhage, not elsewhere classified: Secondary | ICD-10-CM | POA: Diagnosis not present

## 2023-04-10 DIAGNOSIS — R9431 Abnormal electrocardiogram [ECG] [EKG]: Secondary | ICD-10-CM | POA: Diagnosis not present

## 2023-04-10 DIAGNOSIS — I492 Junctional premature depolarization: Secondary | ICD-10-CM | POA: Diagnosis not present

## 2023-04-10 DIAGNOSIS — W19XXXA Unspecified fall, initial encounter: Secondary | ICD-10-CM | POA: Diagnosis not present

## 2023-04-10 DIAGNOSIS — S61219A Laceration without foreign body of unspecified finger without damage to nail, initial encounter: Secondary | ICD-10-CM | POA: Diagnosis not present

## 2023-05-04 DIAGNOSIS — E559 Vitamin D deficiency, unspecified: Secondary | ICD-10-CM | POA: Diagnosis not present

## 2023-05-04 DIAGNOSIS — E785 Hyperlipidemia, unspecified: Secondary | ICD-10-CM | POA: Diagnosis not present

## 2023-05-04 DIAGNOSIS — E1169 Type 2 diabetes mellitus with other specified complication: Secondary | ICD-10-CM | POA: Diagnosis not present

## 2023-05-04 DIAGNOSIS — I10 Essential (primary) hypertension: Secondary | ICD-10-CM | POA: Diagnosis not present

## 2023-05-04 DIAGNOSIS — E7800 Pure hypercholesterolemia, unspecified: Secondary | ICD-10-CM | POA: Diagnosis not present

## 2023-05-04 DIAGNOSIS — J449 Chronic obstructive pulmonary disease, unspecified: Secondary | ICD-10-CM | POA: Diagnosis not present

## 2023-05-04 DIAGNOSIS — Z125 Encounter for screening for malignant neoplasm of prostate: Secondary | ICD-10-CM | POA: Diagnosis not present

## 2023-05-04 DIAGNOSIS — Z23 Encounter for immunization: Secondary | ICD-10-CM | POA: Diagnosis not present

## 2023-08-10 DIAGNOSIS — R918 Other nonspecific abnormal finding of lung field: Secondary | ICD-10-CM | POA: Diagnosis not present

## 2023-08-10 DIAGNOSIS — E7800 Pure hypercholesterolemia, unspecified: Secondary | ICD-10-CM | POA: Diagnosis not present

## 2023-08-10 DIAGNOSIS — E1169 Type 2 diabetes mellitus with other specified complication: Secondary | ICD-10-CM | POA: Diagnosis not present

## 2023-08-10 DIAGNOSIS — I10 Essential (primary) hypertension: Secondary | ICD-10-CM | POA: Diagnosis not present

## 2023-08-10 DIAGNOSIS — R29898 Other symptoms and signs involving the musculoskeletal system: Secondary | ICD-10-CM | POA: Diagnosis not present

## 2023-08-10 DIAGNOSIS — R5383 Other fatigue: Secondary | ICD-10-CM | POA: Diagnosis not present

## 2023-08-10 DIAGNOSIS — E785 Hyperlipidemia, unspecified: Secondary | ICD-10-CM | POA: Diagnosis not present

## 2023-08-10 DIAGNOSIS — E559 Vitamin D deficiency, unspecified: Secondary | ICD-10-CM | POA: Diagnosis not present

## 2023-08-10 DIAGNOSIS — J449 Chronic obstructive pulmonary disease, unspecified: Secondary | ICD-10-CM | POA: Diagnosis not present

## 2023-08-15 DIAGNOSIS — Z743 Need for continuous supervision: Secondary | ICD-10-CM | POA: Diagnosis not present

## 2023-08-15 DIAGNOSIS — Z5321 Procedure and treatment not carried out due to patient leaving prior to being seen by health care provider: Secondary | ICD-10-CM | POA: Diagnosis not present

## 2023-08-15 DIAGNOSIS — I4891 Unspecified atrial fibrillation: Secondary | ICD-10-CM | POA: Diagnosis not present

## 2023-08-15 DIAGNOSIS — R000 Tachycardia, unspecified: Secondary | ICD-10-CM | POA: Diagnosis not present

## 2023-08-15 DIAGNOSIS — R55 Syncope and collapse: Secondary | ICD-10-CM | POA: Diagnosis not present

## 2023-08-15 DIAGNOSIS — R42 Dizziness and giddiness: Secondary | ICD-10-CM | POA: Diagnosis not present

## 2023-08-27 ENCOUNTER — Ambulatory Visit (INDEPENDENT_AMBULATORY_CARE_PROVIDER_SITE_OTHER): Payer: Medicare HMO

## 2023-08-27 ENCOUNTER — Ambulatory Visit: Payer: Medicare HMO | Admitting: Podiatry

## 2023-08-27 DIAGNOSIS — L84 Corns and callosities: Secondary | ICD-10-CM

## 2023-08-27 DIAGNOSIS — E1140 Type 2 diabetes mellitus with diabetic neuropathy, unspecified: Secondary | ICD-10-CM

## 2023-08-27 DIAGNOSIS — M79674 Pain in right toe(s): Secondary | ICD-10-CM | POA: Diagnosis not present

## 2023-08-27 DIAGNOSIS — B351 Tinea unguium: Secondary | ICD-10-CM | POA: Diagnosis not present

## 2023-08-27 DIAGNOSIS — E119 Type 2 diabetes mellitus without complications: Secondary | ICD-10-CM

## 2023-08-27 DIAGNOSIS — M778 Other enthesopathies, not elsewhere classified: Secondary | ICD-10-CM

## 2023-08-27 DIAGNOSIS — Z89422 Acquired absence of other left toe(s): Secondary | ICD-10-CM

## 2023-08-27 DIAGNOSIS — M79675 Pain in left toe(s): Secondary | ICD-10-CM | POA: Diagnosis not present

## 2023-08-27 DIAGNOSIS — R918 Other nonspecific abnormal finding of lung field: Secondary | ICD-10-CM | POA: Diagnosis not present

## 2023-08-27 NOTE — Progress Notes (Signed)
Subjective:  Patient ID: Mark Dickerson, male    DOB: [birth year],  MRN: [id number]  Mark Dickerson presents to clinic today for:  Chief Complaint  Patient presents with   Foot Pain    Left foot pain in the plantar forefoot. Been hurting for 2 years, but has gotten worse. Appears to be a plantar wart or callous. He state it feels better when he shaves it down but then in a couple weeks it hurts again. Diabetic with last A1c of 6.7, a month ago. Says he does take an anticoag but doesn't know the name, he will also bring me a med list at his next apt to up date.    Patient notes nails are thick, discolored, elongated and painful in shoegear when trying to ambulate.  Patient has a previous left third toe amputation.  He has callus on the ball of the left foot which has been painful.  There is a scab on the dorsal aspect of the left second toe.  PCP is Mark Fusi, MD. last seen 05/04/2023  Past Medical History:  Diagnosis Date   Diabetes mellitus without complication (HCC)    Hyperlipidemia    Hypertension    Myocardial infarction [facility])    Peripheral arterial disease (HCC)    Tobacco abuse     Past Surgical History:  Procedure Laterality Date   ABDOMINAL AORTOGRAM W/LOWER EXTREMITY N/A 09/19/2020   Procedure: ABDOMINAL AORTOGRAM W/LOWER EXTREMITY;  Surgeon: Leonie Douglas, MD;  Location: [facility] INVASIVE CV LAB;  Service: Cardiovascular;  Laterality: N/A;   AMPUTATION Left 11/01/2020   Procedure: LEFT THIRD TOE AMPUTATION;  Surgeon: Leonie Douglas, MD;  Location: [facility] OR;  Service: Vascular;  Laterality: Left;   AORTA - BILATERAL FEMORAL ARTERY BYPASS GRAFT N/A 09/24/2020   Procedure: AORTA BIFEMORAL BYPASS GRAFT;  Surgeon: Leonie Douglas, MD;  Location: [facility] OR;  Service: Vascular;  Laterality: N/A;   AORTIC ENDARTERECETOMY N/A 09/24/2020   Procedure: AORTIC ENDARTERECETOMY;  Surgeon: Leonie Douglas, MD;  Location: [facility] OR;  Service: Vascular;  Laterality: N/A;    ENDARTERECTOMY FEMORAL Bilateral 09/24/2020   Procedure: ENDARTERECTOMY BILATERAL COMMON FEMORAL ARTERIES;  Surgeon: Leonie Douglas, MD;  Location: [facility] OR;  Service: Vascular;  Laterality: Bilateral;   FEMORAL-POPLITEAL BYPASS GRAFT Left 09/24/2020   Procedure: LEFT FEMORAL ARTERY TO BELOW KNEE POPLITEAL ARTERY BY PASS GRAFT USING NON REVERSED SAPPENOUS VEIN.;  Surgeon: Leonie Douglas, MD;  Location: [facility] OR;  Service: Vascular;  Laterality: Left;   INTRAOPERATIVE ARTERIOGRAM Bilateral 09/24/2020   Procedure: INTRA OPERATIVE ARTERIOGRAM RIGHT AND LEFT LEG;  Surgeon: Leonie Douglas, MD;  Location: [facility] OR;  Service: Vascular;  Laterality: Bilateral;   IR ANGIOGRAM EXTREMITY BILATERAL  08/31/2020   IR RADIOLOGIST EVAL & MGMT  08/14/2020   IR US GUIDE VASC ACCESS LEFT  08/31/2020   IR US GUIDE VASC ACCESS RIGHT  08/31/2020   VEIN HARVEST Left 09/24/2020   Procedure: VEIN HARVEST LEFT GREATER SAPPHENOUS;  Surgeon: Leonie Douglas, MD;  Location: [facility] OR;  Service: Vascular;  Laterality: Left;   WOUND DEBRIDEMENT Left 11/01/2020   Procedure: DEBRIDEMENT LEFT FOOT WOUND;  Surgeon: Leonie Douglas, MD;  Location: [facility] OR;  Service: Vascular;  Laterality: Left;    No Known Allergies  Review of Systems: Negative except as noted in the HPI.  Objective:  There were no vitals filed for this visit.  Mark Dickerson is a pleasant 78 y.o. male  in NAD. AAO x 3.  Vascular Examination: Capillary refill time is 3-5 seconds to toes bilateral.  Trace palpable pedal pulses b/l LE. Digital hair sparse b/l.  Skin temperature gradient WNL b/l. No varicosities b/l. No cyanosis noted b/l.   Dermatological Examination: Pedal skin with normal turgor, texture and tone b/l. No open wounds. No interdigital macerations b/l. Toenails x 9 are 3mm thick, discolored, dystrophic with subungual debris. There is pain with compression of the nail plates.  They are elongated x 9.  There is a hyperkeratotic lesion left submet 3.  Left  second toe, dorsal aspect has a small superficial eschar noted.  No surrounding erythema is noted.  No evidence of drainage is seen.  Neurological Examination: Protective sensation intact bilateral LE. Vibratory sensation diminished bilateral LE.  Temperature sensation diminished bilateral  Musculoskeletal Examination: Previous left third toe amputation.  Contracture of left second toe.  Assessment/Plan: 1. Pain due to onychomycosis of toenails of both feet   2. Capsulitis of left foot   3. Capsulitis of right foot   4. 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 Callus of foot   6. Type 2 diabetes mellitus with diabetic neuropathy, without long-term current use of insulin (HCC)   7. Encounter for diabetic foot exam (HCC)    The mycotic toenails were sharply debrided x 9 with sterile nail nippers and a power debriding burr to decrease bulk/thickness and length.  The hyperkeratotic lesion left submet 3 was shaved with a sterile #313 blade.  Silvadene cream and a Band-Aid were applied to the dorsal eschar on the left second toe.  Patient to keep an eye on this area and applying antibiotic ointment 2-3 times a week to it until the scab falls off on its own.  Patient would benefit from diabetic shoes with custom offloading insoles.  Will get him scheduled with our pedorthist for a diabetic shoe consult in the near future.  He needs custom insoles to offload the area of the callus left submet 3.   Return in about 3 months (around 11/24/2023) for [facility].   Clerance Lav, DPM, FACFAS [facility]     [address] [place], [name] [address]                Office [phone]  Fax [phone]

## 2023-08-30 ENCOUNTER — Encounter: Payer: Self-pay | Admitting: Podiatry

## 2023-08-31 DIAGNOSIS — J90 Pleural effusion, not elsewhere classified: Secondary | ICD-10-CM | POA: Diagnosis not present

## 2023-08-31 DIAGNOSIS — R918 Other nonspecific abnormal finding of lung field: Secondary | ICD-10-CM | POA: Diagnosis not present

## 2023-09-11 DIAGNOSIS — J449 Chronic obstructive pulmonary disease, unspecified: Secondary | ICD-10-CM | POA: Diagnosis not present

## 2023-09-11 DIAGNOSIS — R918 Other nonspecific abnormal finding of lung field: Secondary | ICD-10-CM | POA: Diagnosis not present

## 2023-09-11 DIAGNOSIS — F17200 Nicotine dependence, unspecified, uncomplicated: Secondary | ICD-10-CM | POA: Diagnosis not present

## 2023-09-17 DIAGNOSIS — R918 Other nonspecific abnormal finding of lung field: Secondary | ICD-10-CM | POA: Diagnosis not present

## 2023-11-17 ENCOUNTER — Other Ambulatory Visit: Payer: Medicare HMO

## 2023-12-10 ENCOUNTER — Ambulatory Visit (INDEPENDENT_AMBULATORY_CARE_PROVIDER_SITE_OTHER): Payer: Medicare HMO | Admitting: Podiatry

## 2023-12-10 DIAGNOSIS — M79675 Pain in left toe(s): Secondary | ICD-10-CM

## 2023-12-10 DIAGNOSIS — E1142 Type 2 diabetes mellitus with diabetic polyneuropathy: Secondary | ICD-10-CM

## 2023-12-10 DIAGNOSIS — Z89422 Acquired absence of other left toe(s): Secondary | ICD-10-CM | POA: Diagnosis not present

## 2023-12-10 DIAGNOSIS — M79674 Pain in right toe(s): Secondary | ICD-10-CM

## 2023-12-10 DIAGNOSIS — B351 Tinea unguium: Secondary | ICD-10-CM | POA: Diagnosis not present

## 2023-12-10 DIAGNOSIS — L84 Corns and callosities: Secondary | ICD-10-CM | POA: Diagnosis not present

## 2023-12-10 NOTE — Progress Notes (Signed)
    Subjective:  Patient ID: Mark Dickerson, male    DOB: 06/10/1946,  MRN: [id number]  Mark Dickerson presents to clinic today for:  Chief Complaint  Patient presents with   [facility]    [facility] with callous, was 6.7 last Nov. He goes tomorrow for a recheck. No anti coag    Patient notes nails are thick and elongated, causing pain in shoe gear when ambulating.  He has a painful callus left submet 3.  He did get a phone call from Mark Dickerson to get scheduled for diabetic shoes.  He has had a previous third toe amputation  PCP is Adrian Hopper, MD. last seen 09/11/2023  Past Medical History:  Diagnosis Date   Diabetes mellitus without complication (HCC)    Hyperlipidemia    Hypertension    Myocardial infarction (HCC)    Peripheral arterial disease (HCC)    Tobacco abuse    No Known Allergies  Objective:  Mark Dickerson is a pleasant 78 y.o. male in NAD. AAO x 3.  Vascular Examination: Patient has palpable DP pulse, absent PT pulse bilateral.  Delayed capillary refill bilateral toes.  Sparse digital hair bilateral.  Proximal to distal cooling WNL bilateral.    Dermatological Examination: Interspaces are clear with no open lesions noted bilateral.  Skin is shiny and atrophic bilateral.  Nails are 3-67mm thick, with yellowish/brown discoloration, subungual debris and distal onycholysis x10.  There is pain with compression of nails x10.  There are hyperkeratotic lesions noted left submet 3.  There is a previous left third toe amputation  Patient qualifies for at-risk foot care because of nontraumatic toe amputation status.  Assessment/Plan: 1. Pain due to onychomycosis of toenails of both feet   2. Callus of foot   3. 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 Type 2 diabetes mellitus with peripheral neuropathy (HCC)     Mycotic nails x10 were sharply debrided with sterile nail nippers and power debriding burr to decrease bulk and length.  Hyperkeratotic lesion on  the plantar aspect of the left third metatarsal head was shaved with #312 blade.  Return in about 3 months (around 03/11/2024) for [facility].   Mark Dickerson, DPM, FACFAS [facility]     [address] [place], [name] [address]                Office [phone]  Fax [phone]

## 2023-12-11 DIAGNOSIS — E559 Vitamin D deficiency, unspecified: Secondary | ICD-10-CM | POA: Diagnosis not present

## 2023-12-11 DIAGNOSIS — E785 Hyperlipidemia, unspecified: Secondary | ICD-10-CM | POA: Diagnosis not present

## 2023-12-11 DIAGNOSIS — J449 Chronic obstructive pulmonary disease, unspecified: Secondary | ICD-10-CM | POA: Diagnosis not present

## 2023-12-11 DIAGNOSIS — I10 Essential (primary) hypertension: Secondary | ICD-10-CM | POA: Diagnosis not present

## 2023-12-11 DIAGNOSIS — E7800 Pure hypercholesterolemia, unspecified: Secondary | ICD-10-CM | POA: Diagnosis not present

## 2023-12-11 DIAGNOSIS — E1169 Type 2 diabetes mellitus with other specified complication: Secondary | ICD-10-CM | POA: Diagnosis not present

## 2024-03-10 ENCOUNTER — Ambulatory Visit (INDEPENDENT_AMBULATORY_CARE_PROVIDER_SITE_OTHER): Admitting: Podiatry

## 2024-03-10 DIAGNOSIS — Z89422 Acquired absence of other left toe(s): Secondary | ICD-10-CM

## 2024-03-10 DIAGNOSIS — M79675 Pain in left toe(s): Secondary | ICD-10-CM | POA: Diagnosis not present

## 2024-03-10 DIAGNOSIS — L84 Corns and callosities: Secondary | ICD-10-CM | POA: Diagnosis not present

## 2024-03-10 DIAGNOSIS — M79674 Pain in right toe(s): Secondary | ICD-10-CM

## 2024-03-10 DIAGNOSIS — B351 Tinea unguium: Secondary | ICD-10-CM | POA: Diagnosis not present

## 2024-03-10 DIAGNOSIS — E1151 Type 2 diabetes mellitus with diabetic peripheral angiopathy without gangrene: Secondary | ICD-10-CM

## 2024-03-10 NOTE — Progress Notes (Signed)
      Subjective:  Patient ID: Mark Dickerson, male    DOB: Mar 12, 1946,  MRN: [id number]  Taiwan Talcott presents to clinic today for:  Patient notes nails are thick and elongated, causing pain in shoe gear when ambulating.  He notes he has an appointment with [facility] in about 1.5 weeks for his shoe/insert consult.  He still has the uncomfortable calluses left submet 3 and left submet 5.  He is a previous left third toe amputation.  He also has a small callus right submet 3.  PCP is Keren Vicenta BRAVO, MD. last seen on 12/11/2023  Past Medical History:  Diagnosis Date   Diabetes mellitus without complication (HCC)    Hyperlipidemia    Hypertension    Myocardial infarction (HCC)    Peripheral arterial disease (HCC)    Tobacco abuse    No Known Allergies  Objective:  Mark Dickerson is a pleasant 78 y.o. male in NAD. AAO x 3.  Vascular Examination: Patient has palpable DP pulse, absent PT pulse bilateral.  Delayed capillary refill bilateral toes.  Sparse digital hair bilateral.  Proximal to distal cooling WNL bilateral.    Dermatological Examination: Interspaces are clear with no open lesions noted bilateral.  Skin is shiny and atrophic bilateral.  Nails are 3-44mm thick, with yellowish/brown discoloration, subungual debris and distal onycholysis x 9.  There is pain with compression of nails x 9.  There are hyperkeratotic lesions noted bilateral submet 3 and left submet 5.  Patient qualifies for at-risk foot care because of history of left toe amputation and PVD.  Assessment/Plan: 1. Pain due to onychomycosis of toenails of both feet   2. Callus of foot   3. 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 Type II diabetes mellitus with peripheral circulatory disorder (HCC)    Mycotic nails x 9 were sharply debrided with sterile nail nippers and power debriding burr to decrease bulk and length.  Hyperkeratotic lesions x 3 were shaved with #312  blade.  The locations of the lesions are noted above in the dermatological examination.  Return in about 3 months (around 06/10/2024) for [facility].   Awanda CHARM Imperial, DPM, FACFAS [facility]     [address] [place], [name] [address]                Office [phone]  Fax [phone]

## 2024-03-16 DIAGNOSIS — E7800 Pure hypercholesterolemia, unspecified: Secondary | ICD-10-CM | POA: Diagnosis not present

## 2024-03-16 DIAGNOSIS — E559 Vitamin D deficiency, unspecified: Secondary | ICD-10-CM | POA: Diagnosis not present

## 2024-03-16 DIAGNOSIS — J449 Chronic obstructive pulmonary disease, unspecified: Secondary | ICD-10-CM | POA: Diagnosis not present

## 2024-03-16 DIAGNOSIS — E785 Hyperlipidemia, unspecified: Secondary | ICD-10-CM | POA: Diagnosis not present

## 2024-03-16 DIAGNOSIS — I10 Essential (primary) hypertension: Secondary | ICD-10-CM | POA: Diagnosis not present

## 2024-03-16 DIAGNOSIS — E1169 Type 2 diabetes mellitus with other specified complication: Secondary | ICD-10-CM | POA: Diagnosis not present

## 2024-03-31 DIAGNOSIS — Z79899 Other long term (current) drug therapy: Secondary | ICD-10-CM | POA: Diagnosis not present

## 2024-03-31 DIAGNOSIS — J449 Chronic obstructive pulmonary disease, unspecified: Secondary | ICD-10-CM | POA: Diagnosis not present

## 2024-03-31 DIAGNOSIS — Z85118 Personal history of other malignant neoplasm of bronchus and lung: Secondary | ICD-10-CM | POA: Diagnosis not present

## 2024-03-31 DIAGNOSIS — F17210 Nicotine dependence, cigarettes, uncomplicated: Secondary | ICD-10-CM | POA: Diagnosis not present

## 2024-03-31 DIAGNOSIS — R410 Disorientation, unspecified: Secondary | ICD-10-CM | POA: Diagnosis not present

## 2024-03-31 DIAGNOSIS — R404 Transient alteration of awareness: Secondary | ICD-10-CM | POA: Diagnosis not present

## 2024-03-31 DIAGNOSIS — Z7984 Long term (current) use of oral hypoglycemic drugs: Secondary | ICD-10-CM | POA: Diagnosis not present

## 2024-03-31 DIAGNOSIS — R918 Other nonspecific abnormal finding of lung field: Secondary | ICD-10-CM | POA: Diagnosis not present

## 2024-03-31 DIAGNOSIS — I10 Essential (primary) hypertension: Secondary | ICD-10-CM | POA: Diagnosis not present

## 2024-05-12 DIAGNOSIS — I739 Peripheral vascular disease, unspecified: Secondary | ICD-10-CM | POA: Diagnosis not present

## 2024-05-12 DIAGNOSIS — Z89422 Acquired absence of other left toe(s): Secondary | ICD-10-CM | POA: Diagnosis not present

## 2024-06-03 ENCOUNTER — Ambulatory Visit (INDEPENDENT_AMBULATORY_CARE_PROVIDER_SITE_OTHER): Admitting: Podiatry

## 2024-06-03 DIAGNOSIS — M79675 Pain in left toe(s): Secondary | ICD-10-CM

## 2024-06-03 DIAGNOSIS — B351 Tinea unguium: Secondary | ICD-10-CM

## 2024-06-03 DIAGNOSIS — L84 Corns and callosities: Secondary | ICD-10-CM

## 2024-06-03 DIAGNOSIS — Z89422 Acquired absence of other left toe(s): Secondary | ICD-10-CM

## 2024-06-03 DIAGNOSIS — M79674 Pain in right toe(s): Secondary | ICD-10-CM | POA: Diagnosis not present

## 2024-06-03 DIAGNOSIS — E1151 Type 2 diabetes mellitus with diabetic peripheral angiopathy without gangrene: Secondary | ICD-10-CM

## 2024-06-03 NOTE — Progress Notes (Unsigned)
 Subjective:  Patient ID: Mark Dickerson, male    DOB: February 11, 1946,  MRN: [id number]  Mark Dickerson presents to clinic today for:  Chief Complaint  Patient presents with   [facility]    Last A1c: 5.3, takes Plavix . Advised patient to bring updated med list next time. Has 2x callous on left foot, ball of foot and 2x right foot, medial aspect near 1st hallux and ball of foot.    Patient notes nails are thick and elongated, causing pain in shoe gear when ambulating.  He has painful calluses left submet 3 and left submet 5, and right submet 3.  The left submet 5 callus is new.  They note that he got his new shoes and inserts from [place].  When the inserts were pulled out of his shoe, he had two right sided inserts in each shoe.  There was no left insert in the shoe which may have contributed to his new left submet 5 callus.  PCP is Keren Vicenta BRAVO, MD. last seen 03/16/2024  Past Medical History:  Diagnosis Date   Diabetes mellitus without complication (HCC)    Hyperlipidemia    Hypertension    Myocardial infarction (HCC)    Peripheral arterial disease    Tobacco abuse    No Known Allergies  Objective:  Mark Dickerson is a pleasant 78 y.o. male in NAD. AAO x 3.  Vascular Examination: Patient has palpable DP pulse, absent PT pulse bilateral.  Delayed capillary refill bilateral toes.  Sparse digital hair bilateral.  Proximal to distal cooling WNL bilateral.    Dermatological Examination: Interspaces are clear with no open lesions noted bilateral.  Skin is shiny and atrophic bilateral.  Nails are 3-15mm thick, with yellowish/brown discoloration, subungual debris and distal onycholysis x 9.  There is pain with compression of nails x 9.  There are hyperkeratotic lesions noted bilateral submet 3 and left submet 5.  Patient qualifies for at-risk foot care because of nontraumatic toe amputation status (Q7).  Assessment/Plan: 1. Pain due to onychomycosis of toenails of both feet    2. Callus of foot   3. Status post amputation of toe of left foot   4. Type II diabetes mellitus with peripheral circulatory disorder (HCC)    Mycotic nails x 9 were sharply debrided with sterile nail nippers and power debriding burr to decrease bulk and length.  Hyperkeratotic lesions x 3 were shaved with #312 blade.  The locations are listed above in the dermatological examination and all are proximal to the toes.  Discussed the insert issue with the patient and his wife today.  I did not want him going home with the wrong insert and that shoe, so PowerStep inserts were placed in both shoes until he gets home.  He needs to check all of his inserts and make sure he has 3 for the right foot and 3 for the left foot.  He will need to call [facility] if this needs remediated.  Will recheck his inserts next visit and offload the callused areas if needed.  Follow-up 3 months   Deontae Robson DSABRA Imperial, DPM, FACFAS [facility]     [address], [name] [address]  Office [phone]  Fax [phone]

## 2024-06-15 DIAGNOSIS — I10 Essential (primary) hypertension: Secondary | ICD-10-CM | POA: Diagnosis not present

## 2024-06-15 DIAGNOSIS — R918 Other nonspecific abnormal finding of lung field: Secondary | ICD-10-CM | POA: Diagnosis not present

## 2024-06-15 DIAGNOSIS — Z23 Encounter for immunization: Secondary | ICD-10-CM | POA: Diagnosis not present

## 2024-06-15 DIAGNOSIS — Z125 Encounter for screening for malignant neoplasm of prostate: Secondary | ICD-10-CM | POA: Diagnosis not present

## 2024-06-15 DIAGNOSIS — E7800 Pure hypercholesterolemia, unspecified: Secondary | ICD-10-CM | POA: Diagnosis not present

## 2024-06-15 DIAGNOSIS — E785 Hyperlipidemia, unspecified: Secondary | ICD-10-CM | POA: Diagnosis not present

## 2024-06-15 DIAGNOSIS — E559 Vitamin D deficiency, unspecified: Secondary | ICD-10-CM | POA: Diagnosis not present

## 2024-06-15 DIAGNOSIS — E1169 Type 2 diabetes mellitus with other specified complication: Secondary | ICD-10-CM | POA: Diagnosis not present

## 2024-08-03 ENCOUNTER — Emergency Department (HOSPITAL_COMMUNITY)

## 2024-08-03 ENCOUNTER — Inpatient Hospital Stay (HOSPITAL_COMMUNITY)
Admission: EM | Admit: 2024-08-03 | Discharge: 2024-08-17 | DRG: 064 | Disposition: A | Discharge status: Skilled Nursing Facility | Attending: Internal Medicine | Admitting: Internal Medicine

## 2024-08-03 ENCOUNTER — Inpatient Hospital Stay (HOSPITAL_COMMUNITY)

## 2024-08-03 ENCOUNTER — Other Ambulatory Visit: Payer: Self-pay

## 2024-08-03 ENCOUNTER — Other Ambulatory Visit: Payer: Self-pay | Admitting: Internal Medicine

## 2024-08-03 DIAGNOSIS — R1311 Dysphagia, oral phase: Secondary | ICD-10-CM | POA: Diagnosis present

## 2024-08-03 DIAGNOSIS — R471 Dysarthria and anarthria: Secondary | ICD-10-CM | POA: Diagnosis present

## 2024-08-03 DIAGNOSIS — E872 Acidosis, unspecified: Secondary | ICD-10-CM | POA: Diagnosis present

## 2024-08-03 DIAGNOSIS — E876 Hypokalemia: Secondary | ICD-10-CM | POA: Diagnosis not present

## 2024-08-03 DIAGNOSIS — E1151 Type 2 diabetes mellitus with diabetic peripheral angiopathy without gangrene: Secondary | ICD-10-CM | POA: Diagnosis not present

## 2024-08-03 DIAGNOSIS — R29701 NIHSS score 1: Secondary | ICD-10-CM | POA: Diagnosis present

## 2024-08-03 DIAGNOSIS — R29810 Facial weakness: Secondary | ICD-10-CM | POA: Diagnosis present

## 2024-08-03 DIAGNOSIS — R4701 Aphasia: Secondary | ICD-10-CM | POA: Diagnosis present

## 2024-08-03 DIAGNOSIS — F05 Delirium due to known physiological condition: Secondary | ICD-10-CM | POA: Diagnosis not present

## 2024-08-03 DIAGNOSIS — I6381 Other cerebral infarction due to occlusion or stenosis of small artery: Principal | ICD-10-CM | POA: Diagnosis present

## 2024-08-03 DIAGNOSIS — R451 Restlessness and agitation: Secondary | ICD-10-CM | POA: Diagnosis present

## 2024-08-03 DIAGNOSIS — E1165 Type 2 diabetes mellitus with hyperglycemia: Secondary | ICD-10-CM | POA: Diagnosis present

## 2024-08-03 DIAGNOSIS — Z9181 History of falling: Secondary | ICD-10-CM

## 2024-08-03 DIAGNOSIS — Z79899 Other long term (current) drug therapy: Secondary | ICD-10-CM

## 2024-08-03 DIAGNOSIS — E860 Dehydration: Secondary | ICD-10-CM | POA: Diagnosis present

## 2024-08-03 DIAGNOSIS — E861 Hypovolemia: Secondary | ICD-10-CM | POA: Diagnosis not present

## 2024-08-03 DIAGNOSIS — F17210 Nicotine dependence, cigarettes, uncomplicated: Secondary | ICD-10-CM | POA: Diagnosis not present

## 2024-08-03 DIAGNOSIS — I4891 Unspecified atrial fibrillation: Secondary | ICD-10-CM | POA: Diagnosis present

## 2024-08-03 DIAGNOSIS — Z751 Person awaiting admission to adequate facility elsewhere: Secondary | ICD-10-CM

## 2024-08-03 DIAGNOSIS — R1312 Dysphagia, oropharyngeal phase: Secondary | ICD-10-CM | POA: Diagnosis present

## 2024-08-03 DIAGNOSIS — E871 Hypo-osmolality and hyponatremia: Secondary | ICD-10-CM | POA: Diagnosis not present

## 2024-08-03 DIAGNOSIS — E118 Type 2 diabetes mellitus with unspecified complications: Secondary | ICD-10-CM | POA: Diagnosis present

## 2024-08-03 DIAGNOSIS — Z7984 Long term (current) use of oral hypoglycemic drugs: Secondary | ICD-10-CM

## 2024-08-03 DIAGNOSIS — J432 Centrilobular emphysema: Secondary | ICD-10-CM | POA: Diagnosis present

## 2024-08-03 DIAGNOSIS — R296 Repeated falls: Secondary | ICD-10-CM | POA: Diagnosis present

## 2024-08-03 DIAGNOSIS — S065XAA Traumatic subdural hemorrhage with loss of consciousness status unknown, initial encounter: Secondary | ICD-10-CM | POA: Diagnosis not present

## 2024-08-03 DIAGNOSIS — K5900 Constipation, unspecified: Secondary | ICD-10-CM | POA: Diagnosis not present

## 2024-08-03 DIAGNOSIS — Z85118 Personal history of other malignant neoplasm of bronchus and lung: Secondary | ICD-10-CM

## 2024-08-03 DIAGNOSIS — I6389 Other cerebral infarction: Secondary | ICD-10-CM | POA: Diagnosis not present

## 2024-08-03 DIAGNOSIS — E785 Hyperlipidemia, unspecified: Secondary | ICD-10-CM | POA: Diagnosis present

## 2024-08-03 DIAGNOSIS — I10 Essential (primary) hypertension: Secondary | ICD-10-CM | POA: Diagnosis not present

## 2024-08-03 DIAGNOSIS — I2510 Atherosclerotic heart disease of native coronary artery without angina pectoris: Secondary | ICD-10-CM | POA: Diagnosis present

## 2024-08-03 DIAGNOSIS — Z7902 Long term (current) use of antithrombotics/antiplatelets: Secondary | ICD-10-CM

## 2024-08-03 DIAGNOSIS — I639 Cerebral infarction, unspecified: Principal | ICD-10-CM | POA: Diagnosis present

## 2024-08-03 DIAGNOSIS — I252 Old myocardial infarction: Secondary | ICD-10-CM

## 2024-08-03 DIAGNOSIS — Z6824 Body mass index (BMI) 24.0-24.9, adult: Secondary | ICD-10-CM

## 2024-08-03 DIAGNOSIS — W19XXXA Unspecified fall, initial encounter: Secondary | ICD-10-CM | POA: Diagnosis present

## 2024-08-03 DIAGNOSIS — R1313 Dysphagia, pharyngeal phase: Secondary | ICD-10-CM | POA: Diagnosis present

## 2024-08-03 DIAGNOSIS — E440 Moderate protein-calorie malnutrition: Secondary | ICD-10-CM | POA: Diagnosis present

## 2024-08-03 LAB — CBC
HCT: 43.9 % (ref 39.0–52.0)
Hemoglobin: 15.1 g/dL (ref 13.0–17.0)
MCH: 31.6 pg (ref 26.0–34.0)
MCHC: 34.4 g/dL (ref 30.0–36.0)
MCV: 91.8 fL (ref 80.0–100.0)
Platelets: 198 K/uL (ref 150–400)
RBC: 4.78 MIL/uL (ref 4.22–5.81)
RDW: 15.2 % (ref 11.5–15.5)
WBC: 9.1 K/uL (ref 4.0–10.5)
nRBC: 0 % (ref 0.0–0.2)

## 2024-08-03 LAB — COMPREHENSIVE METABOLIC PANEL WITH GFR
ALT: 7 U/L (ref 0–44)
AST: 31 U/L (ref 15–41)
Albumin: 4.4 g/dL (ref 3.5–5.0)
Alkaline Phosphatase: 69 U/L (ref 38–126)
Anion gap: 15 (ref 5–15)
BUN: 12 mg/dL (ref 8–23)
CO2: 23 mmol/L (ref 22–32)
Calcium: 10.3 mg/dL (ref 8.9–10.3)
Chloride: 95 mmol/L — ABNORMAL LOW (ref 98–111)
Creatinine, Ser: 1.01 mg/dL (ref 0.61–1.24)
GFR, Estimated: >60 mL/min
Glucose, Bld: 117 mg/dL — ABNORMAL HIGH (ref 70–99)
Potassium: 4.4 mmol/L (ref 3.5–5.1)
Sodium: 132 mmol/L — ABNORMAL LOW (ref 135–145)
Total Bilirubin: 1 mg/dL (ref 0.0–1.2)
Total Protein: 7.8 g/dL (ref 6.5–8.1)

## 2024-08-03 LAB — URINE DRUG SCREEN
Amphetamines: NEGATIVE
Barbiturates: NEGATIVE
Benzodiazepines: NEGATIVE
Cocaine: NEGATIVE
Fentanyl: NEGATIVE
Methadone Scn, Ur: NEGATIVE
Opiates: NEGATIVE
Tetrahydrocannabinol: NEGATIVE

## 2024-08-03 LAB — PROTIME-INR
INR: 1 (ref 0.8–1.2)
Prothrombin Time: 14 s (ref 11.4–15.2)

## 2024-08-03 LAB — DIFFERENTIAL
Abs Immature Granulocytes: 0.05 K/uL (ref 0.00–0.07)
Basophils Absolute: 0.1 K/uL (ref 0.0–0.1)
Basophils Relative: 1 %
Eosinophils Absolute: 0.7 K/uL — ABNORMAL HIGH (ref 0.0–0.5)
Eosinophils Relative: 8 %
Immature Granulocytes: 1 %
Lymphocytes Relative: 19 %
Lymphs Abs: 1.8 K/uL (ref 0.7–4.0)
Monocytes Absolute: 0.7 K/uL (ref 0.1–1.0)
Monocytes Relative: 8 %
Neutro Abs: 5.9 K/uL (ref 1.7–7.7)
Neutrophils Relative %: 63 %

## 2024-08-03 LAB — APTT: aPTT: 32 s (ref 24–36)

## 2024-08-03 LAB — CBG MONITORING, ED: Glucose-Capillary: 128 mg/dL — ABNORMAL HIGH (ref 70–99)

## 2024-08-03 LAB — ETHANOL: Alcohol, Ethyl (B): <15 mg/dL

## 2024-08-03 MED ORDER — ALBUTEROL SULFATE (2.5 MG/3ML) 0.083% IN NEBU: 3.0000 mL | INHALATION_SOLUTION | RESPIRATORY_TRACT | Status: DC | PRN

## 2024-08-03 MED ORDER — QUETIAPINE FUMARATE 25 MG PO TABS
25.0000 mg | ORAL_TABLET | Freq: Once | ORAL | Status: AC
  Administered 2024-08-03: 25 mg via ORAL
  Filled 2024-08-03: qty 1

## 2024-08-03 MED ORDER — LABETALOL HCL 5 MG/ML IV SOLN: 5.0000 mg | Freq: Once | INTRAVENOUS | Status: DC

## 2024-08-03 MED ORDER — IOHEXOL 350 MG/ML SOLN
75.0000 mL | Freq: Once | INTRAVENOUS | Status: AC | PRN
  Administered 2024-08-03: 75 mL via INTRAVENOUS

## 2024-08-03 MED ORDER — STROKE: EARLY STAGES OF RECOVERY BOOK
Freq: Once | Status: AC
  Filled 2024-08-03: qty 1

## 2024-08-03 MED ORDER — ENOXAPARIN SODIUM 40 MG/0.4ML IJ SOSY: 40.0000 mg | PREFILLED_SYRINGE | INTRAMUSCULAR | Status: DC

## 2024-08-03 NOTE — Hospital Course (Addendum)
 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 Patient has an extensive and ongoing history of risk factors including smoking history, HTN, HLD, T2DM, and PAD. Imaging showed 90% narrowing of the distal RCA on CT angiography and small acute infarct in the left basal ganglia on noncontrast MRI. Etiology of stroke is most likely thrombotic, but at risk for embolic source due to new onset A-fib (see below). Neurology consulted and further workup pursued including TTE (no cardiac source of embolus identified), lipid panel (LDL of 109, switched to rosuvastatin  20 mg), A1C (6.1), and SBP goal of <160 (in the setting of SDH, see below). PT/OT/SLP consulted, patient was briefly placed on dysphagia 1 diet with moderately thick liquids but transitioned to NPO with Dobhoff after concerns during meals from nursing. After a few days, he was transitioned back to dysphagia 1 diet. Dobhoff removed prior to discharge to SNF. Neurology recommends follow up in the stroke clinic in ~4 weeks.  #Subdural hematoma #Probable fall on antiplatelet therapy #History of Falls Patient has had a concerning history of falls over the last 9 months, voiced by the patient's family. They express concern regarding his weight loss and weakness secondary to his suspected lung cancer (see below). They deny any indications that the patient's falls are mechanical in nature. Most recent fall suspected to be prior to presentation at [facility] while on antiplatelet therapy. Imaging showed acute subdural hematoma along the frontal falx cerebri measuring 5 cm on noncontrast CT with stable findings on repeat CT 6 hours later. Neurosurgery and neurology consulted. Neurology recommended holding antiplatelet therapy until further imaging of SDH in 2-3 weeks.  #New-onset atrial  fibrillation without RVR  Patient presented with new-onset A-fib without RVR. No prior history of RVR or anticoagulation. CHA2DS2-VASc of 7. Patient remained asymptomatic throughout his hospitalization, denying any chest pain, palpitations, or shortness of breath. His rates remained well controlled on metoprolol  tartrate. Per 2022 cardiology notes, this was started for coronary artery disease. Did not start DOAC due to c/f for subdural hematoma.   #Hypertension  Patient has a history of poorly controlled hypertension presumably managed with amlodipine , metoprolol , benazepril , and hydrochlorothiazide. During hospitalization, neurology recommended SBP <160 with long-term goal of normotension. Patient's systolic pressures have been became elevated to 200s, above recommended goal. Patient's home amlodipine  was increased to 10 mg and he received 6.25 mg lopressor  bid (through tube), and benazepril  40 mg. Did require IV labetalol  occasionally. Held home hydrochlorothiazide due to concerns for hyponatremia.  #Hospital Delirium Approximately 7 days into hospitalization, patient became restless again, stating he felt anxious. He kept wanting to get up out of bed and into his chair. Neuro exam at that time was consistent with prior. Added prn hydroxyzine  and Seroquel , placed on delirium precautions.  #Type 2 diabetes mellitus, non-insulin  dependent, controlled, complicated by peripheral neuropathy and large vessel disease  Patient presented with a history of diabetes well controlled with metformin 500 mg daily. A1C in the hospital resulted 6.1. Given patient's control of his diabetes and limited PO intake, held home metformin. Received sensitive SSI. Glucoses ranged from 100-180s.  #Hyponatremia Thought to be due component of hypovolemia vs. Hypotonic fluids (free water ) administered via tube. Mild at 134. Discharged with sodium of ***.  #Hypokalemia Thought to be due to poor oral intake before Dobhoff and  diet were placed. Improved back to  normal range. Discharged with K of ***.   #Hyperlipidemia Patient has a history of HLD managed with pravastatin at home. Most recent lipid panel in the hospital showed LDL of 109, significantly above goal of <70. Transitioned to rosuvastatin  20 mg daily.  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 Patient declined biopsy and treatment, and denied experiencing any pain or difficulty breathing due to COPD or mass. He was not in any respiratory distress and was oxygenating well on room air throughout his hospitalization.   #PAD #History of NSTEMI Patient has an extensive history of PAD with multiple lower extremity revascularization procedures as well as post-procedure NSTEMI in 2022 (without catheterization per patient request). For his PAD, he had been on antiplatelet therapy which has been held in context of his SDH. He remained asymptomatic throughout his admission.  #Constipation Required bowel regimen with scheduled MiraLax  and Sorbitol  solution prn to start having regular bowel movements.    Name: Mark Dickerson MRN: [id number] DOB: [birth year] 79 y.o. PCP: Mark Vicenta BRAVO, MD  Date of Admission: 08/03/2024 10:07 AM Date of Discharge: 08/10/2024  Attending Physician: {[id number]}  Discharge Diagnosis: Principal Problem:   Acute stroke of basal ganglia (HCC) Active Problems:   Hypertension   Hyperlipidemia   Type 2 diabetes mellitus with complication, without long-term current use of insulin  (HCC)   Subdural hematoma (HCC)   Atrial fibrillation (HCC)   Malnutrition of moderate degree   SDH (subdural hematoma) (HCC)   Acute ischemic stroke [facility])   Discharge Medications: Allergies as of 08/10/2024   No Known Allergies   Med Rec  must be completed prior to using this [facility]***       Disposition and follow-up:   Mr.Mark Dickerson was discharged from [facility] in {DISCHARGE CONDITION:19696} condition.  At the hospital follow up visit please address:  Left basal ganglia CVA/Subdural hematoma - not currently on DAPT; needs repeat CT head non-con to follow bleed around 08/18/24-08/25/24. If resorbed, can start ASA 81 mg and Plavix  for his stroke. Thought to be embolic d/t new afib on arrival, but TTE negative for source. New-onset Afib - continued home metoprolol  upon discharge for rate control. If SDH resorbed, will need to decide to anticoagulate. Hypertension - Difficult to control while inpatient, though did not start hydrochlorothiazide. Amlodipine  increased to 10 mg. Please assess whether other changes are necessary. GOC/Lung Mass - Please clarify further goals of care with known lung mass and suspected cancer.  Labs / imaging needed at time of follow-up: CT head non-contrast (to follow SDH)  Pending labs/ test needing follow-up: None    Denies pain, says he slept well. Anything bothering him idk  Knows he is in hospital, in [facility], person  Says year is 93, IDK he does think it's cold outside  Says he did not eat breakfast this morning, denies any concerns, not choking or coughing with eating  Decreased skin turgor     1/31: oriented to person, not place (gf house)

## 2024-08-03 NOTE — ED Provider Notes (Signed)
 " [facility] DE[facility]MENT AT [facility] Provider Note   CSN: [id number] Arrival date & time: 08/03/24  1007     Patient presents with: Aphasia and Facial Droop   Mark Dickerson is a 79 y.o. male.   79 yo M with a complaints of facial droop and slurred speech and trouble with his vision.  This been going on since he woke up 1 Monday.  He denies one-sided numbness or weakness.  Has had some trouble swallowing at times.  Denies injury to the head of the neck.          Prior to Admission medications  Medication Sig Start Date End Date Taking? Authorizing Provider  acetaminophen  (TYLENOL ) 500 MG tablet Take 1,000 mg by mouth 3 (three) times daily.    [provider]  albuterol  (VENTOLIN  HFA) 108 (90 Base) MCG/ACT inhaler Inhale 2 puffs into the lungs every 4 (four) hours as needed for wheezing or shortness of breath. 10/10/20   [provider]  benazepril  (LOTENSIN ) 10 MG tablet TAKE 1 TABLET BY MOUTH EVERY DAY 01/21/21   Anner Alm ORN, MD  clopidogrel  (PLAVIX ) 75 MG tablet Take 75 mg by mouth daily. 03/27/24   [provider]  Coenzyme Q10 200 MG capsule Take 200 mg by mouth daily.    [provider]  fenofibrate (TRICOR) 145 MG tablet Take 145 mg by mouth daily. 07/11/20   [provider]  gabapentin (NEURONTIN) 300 MG capsule Take 300 mg by mouth 3 (three) times daily.    [provider]  hydrochlorothiazide (HYDRODIURIL) 25 MG tablet Take 25 mg by mouth daily. 05/15/20   [provider]  metFORMIN (GLUCOPHAGE-XR) 500 MG 24 hr tablet Take 500 mg by mouth daily. 05/17/20   [provider]  metoprolol  succinate (TOPROL  XL) 25 MG 24 hr tablet Take 0.5 tablets (12.5 mg total) by mouth daily. 01/01/21   Emelia Josefa HERO, NP  nitroGLYCERIN  (NITROSTAT ) 0.4 MG SL tablet NEW PRESCRIPTION REQUEST: PLACE ONE TABLET UNDER THE TONGUE BY MOUTH EVERY DAY AS NEEDED 11/20/20   Anner Alm ORN, MD  SYMBICORT  160-4.5 MCG/ACT inhaler Inhale 2 puffs into the lungs 2 (two) times daily. 08/01/20   [provider]  Vitamin D, Ergocalciferol, (DRISDOL) 1.25 MG (50000 UNIT) CAPS capsule Take 50,000 Units by mouth once a week. 08/01/20   [provider]    Allergies: Patient has no known allergies.    Review of Systems  Updated Vital Signs BP (!) 152/83   Pulse 73   Temp 97.6 F (36.4 C) (Oral)   Resp 18   Ht 6' (1.829 m)   Wt 81.6 kg   SpO2 98%   BMI 24.41 kg/m   Physical Exam Vitals and nursing note reviewed.  Constitutional:      Appearance: He is well-developed.  HENT:     Head: Normocephalic and atraumatic.  Eyes:     Pupils: Pupils are equal, round, and reactive to light.  Neck:     Vascular: No JVD.  Cardiovascular:     Rate and Rhythm: Normal rate and regular rhythm.     Heart sounds: No murmur heard.    No friction rub. No gallop.  Pulmonary:     Effort: No respiratory distress.     Breath sounds: No wheezing.  Abdominal:     General: There is no distension.     Tenderness: There is no abdominal tenderness. There is no guarding or rebound.  Musculoskeletal:  General: Normal range of motion.     Cervical back: Normal range of motion and neck supple.  Skin:    Coloration: Skin is not pale.     Findings: No rash.  Neurological:     Mental Status: He is alert and oriented to person, place, and time.     Comments: Right-sided facial droop.  Difficulty with extraocular motion likely due to vision impairment.  No obvious unilateral weakness.  Psychiatric:        Behavior: Behavior normal.     (all labs ordered are listed, but only abnormal results are displayed) Labs Reviewed  DIFFERENTIAL - Abnormal; Notable for the following components:      Result Value   Eosinophils Absolute 0.7 (*)    All other components within normal limits  COMPREHENSIVE METABOLIC PANEL WITH GFR - Abnormal; Notable for the following components:   Sodium 132 (*)     Chloride 95 (*)    Glucose, Bld 117 (*)    All other components within normal limits  CBG MONITORING, ED - Abnormal; Notable for the following components:   Glucose-Capillary 128 (*)    All other components within normal limits  PROTIME-INR  APTT  CBC  ETHANOL  URINE DRUG SCREEN    EKG: EKG Interpretation Date/Time:  Wednesday August 03 2024 10:27:23 EST Ventricular Rate:  81 PR Interval:    QRS Duration:  143 QT Interval:  377 QTC Calculation: 438 R Axis:   41  Text Interpretation: Atrial fibrillation Nonspecific intraventricular conduction delay Anterior infarct, old Borderline ST depression, diffuse leads No significant change since last tracing Confirmed by Emil Share [id number]) on 08/03/2024 10:31:19 AM  Radiology: MR BRAIN WO CONTRAST Result Date: 08/03/2024 EXAM: MRI Brain Without Contrast 08/03/2024 01:38:21 PM TECHNIQUE: Multiplanar multisequence MRI of the head/brain was performed without the administration of intravenous contrast. COMPARISON: None available. CLINICAL HISTORY: Neuro deficit, acute, stroke suspected FINDINGS: BRAIN AND VENTRICLES: Small acute infarct in the left basal ganglia. No intracranial hemorrhage. No mass. No midline shift. No hydrocephalus. The sella is unremarkable. Normal flow voids. Moderate patchy T2 hyperintensities in the white matter, compatible with chronic microvascular ischemic disease. ORBITS: No significant abnormality. SINUSES AND MASTOIDS: No significant abnormality. BONES AND SOFT TISSUES: Normal marrow signal. No soft tissue abnormality. IMPRESSION: 1. Small acute infarct in the left basal ganglia. 2. Moderate chronic microvascular ischemic change. Electronically signed by: Glendia Molt MD 08/03/2024 02:46 PM EST RP Workstation: [id number]   CT ANGIO HEAD NECK W WO CM Result Date: 08/03/2024 EXAM: CTA HEAD AND NECK WITH AND WITHOUT 08/03/2024 11:49:09 AM TECHNIQUE: CTA of the head and neck was performed with and without the administration of  75 mL of intravenous contrast (iohexol  (OMNIPAQUE ) 350 MG/ML injection). Multiplanar 2D and/or 3D reformatted images are provided for review. Automated exposure control, iterative reconstruction, and/or weight based adjustment of the mA/kV was utilized to reduce the radiation dose to as low as reasonably achievable. Stenosis of the internal carotid arteries measured using NASCET criteria. COMPARISON: CT head dated 08/03/2024 11:49:09 AM CLINICAL HISTORY: Stroke/TIA, determine embolic source. FINDINGS: CTA NECK: AORTIC ARCH AND ARCH VESSELS: Mild atherosclerosis of the aortic arch. Atherosclerosis involving the left subclavian artery origin resulting in mild stenosis. Additional atherosclerosis along the brachiocephalic artery and proximal right subclavian artery resulting in mild to moderate stenosis of the proximal right subclavian artery. No dissection or arterial injury. CERVICAL CAROTID ARTERIES: Right carotid bifurcation: Prominent calcified atherosclerosis. Severe stenosis at the distal aspect of the right common carotid  artery just prior to the bifurcation. Severe stenosis at the origin of the right cervical ICA with approximately 90% narrowing of the vessel. Additional atherosclerosis along the right carotid bulb which results in approximately 75% stenosis. Left carotid bifurcation: Prominent calcified atherosclerosis. Approximately 60% stenosis at the origin of the left cervical ICA with additional atherosclerosis along the left carotid bulb resulting in similar stenosis. No dissection or arterial injury. CERVICAL VERTEBRAL ARTERIES: The right vertebral artery is dominant. Bulky calcified atherosclerosis at the origin of the right vertebral artery resulting in moderate stenosis. Atherosclerosis at the origin of the nondominant left vertebral artery also resulting in moderate stenosis. Atherosclerosis of the right V4 segment resulting in mild stenosis. No dissection or arterial injury. LUNGS AND MEDIASTINUM:  Centrilobular emphysema in the lung apices. SOFT TISSUES: Edentulous maxilla. BONES: Degenerative changes throughout the visualized spine with disc space narrowing and prominent degenerative endplate osteophytes most pronounced at C4-C5. There is facet arthrosis at multiple levels throughout the cervical spine greatest on the left at C2-C3 and C3-C4. 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 There is moderate stenosis at the posterior vertical portion of the left cavernous ICA. Additional mild to moderate stenosis throughout the bilateral cavernous and supraclinoid ICAs. No significant stenosis of the anterior cerebral arteries. No significant stenosis of the middle cerebral arteries. No aneurysm. POSTERIOR CIRCULATION: No significant stenosis of the posterior cerebral arteries. No significant stenosis of the basilar artery. No significant stenosis of the intracranial vertebral arteries. No aneurysm. OTHER: Redemonstrated subdural hematoma along the anterior falx. No dural venous sinus thrombosis on this non-dedicated study. IMPRESSION: 1. No acute large vessel occlusion. 2. 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 3. Approximately 60% stenosis at the origin of the left cervical ICA. 4. Moderate stenosis at the origins of the bilateral vertebral arteries. 5. Moderate stenosis at the posterior vertical portion of the left cavernous ICA, with additional mild to moderate stenosis throughout the bilateral cavernous and supraclinoid ICAs. 6. Redemonstrated subdural hematoma along the anterior falx. 7. Pulmonary emphysema is an independent risk factor for lung cancer. Recommend consideration for evaluation for a low-dose CT lung cancer screening program. Electronically signed by: Donnice Mania MD 08/03/2024 12:16 PM EST RP  Workstation: [id number]   DG Chest Port 1 View Result Date: 08/03/2024 CLINICAL DATA:  Hypoxia. EXAM: PORTABLE CHEST 1 VIEW COMPARISON:  03/31/2024 FINDINGS: Lungs are somewhat hypoinflated. Increased size of masslike opacity over the right infrahilar region measuring approximately 5.4 x 7.2 cm. Subcentimeter rounded nodular opacity over the left lung base which may represent the left nipple. No effusion. Cardiomediastinal silhouette and remainder of the exam is unchanged. IMPRESSION: 1. Increased size of masslike opacity over the right infrahilar region measuring approximately 5.4 x 7.2 cm. Recommend further evaluation with chest CT with contrast. 2. Subcentimeter rounded nodular opacity over the left lung base which may represent the left nipple. This can be evaluated on the chest CT. Electronically Signed   By: Toribio Agreste M.D.   On: 08/03/2024 11:18   CT HEAD WO CONTRAST Result Date: 08/03/2024 CLINICAL DATA:  Slurred speech and facial droop with urinary incontinence a few days. Symptoms began Monday morning. EXAM: CT HEAD WITHOUT CONTRAST TECHNIQUE: Contiguous axial images were obtained from the base of the skull through the vertex without intravenous contrast. RADIATION DOSE REDUCTION: This exam was performed according to  the departmental dose-optimization program which includes automated exposure control, adjustment of the mA and/or kV according to patient size and/or use of iterative reconstruction technique. COMPARISON:  03/31/2024 FINDINGS: Brain: Examination demonstrates acute subdural hemorrhage along the high frontal parietal falx cerebri. This measures 5 mm in thickness. There is no significant mass effect or midline shift. Ventricles and cisterns are otherwise normal. There is chronic ischemic microvascular disease present. Old lacunar infarct over the right basal ganglia. Small old lacunar infarct over the left caudate nucleus. Mild prominence of the cisterna magna. Vascular: No hyperdense  vessel or unexpected calcification. Skull: Normal. Negative for fracture or focal lesion. Sinuses/Orbits: No acute finding. Other: None. IMPRESSION: 1. Acute subdural hematoma along the high frontal parietal falx cerebri measuring 5 mm in thickness. No significant mass effect or midline shift. 2. 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 Critical Value/emergent results were called by telephone at the time of interpretation on 08/03/2024 at 11:13 am to provider Breeze Berringer , who verbally acknowledged these results. Electronically Signed   By: Toribio Agreste M.D.   On: 08/03/2024 11:13     .Critical Care  Performed by: Emil Share, DO Authorized by: Emil Share, DO   Critical care provider statement:    Critical care time (minutes):  35   Critical care time was exclusive of:  Separately billable procedures and treating other patients   Critical care was time spent personally by me on the following activities:  Development of treatment plan with patient or surrogate, discussions with consultants, evaluation of patient's response to treatment, examination of patient, ordering and review of laboratory studies, ordering and review of radiographic studies, ordering and performing treatments and interventions, pulse oximetry, re-evaluation of patient's condition and review of old charts   Care discussed with: admitting provider      Medications Ordered in the ED  iohexol  (OMNIPAQUE ) 350 MG/ML injection 75 mL (75 mLs Intravenous Contrast Given 08/03/24 1149)                                    Medical Decision Making Amount and/or Complexity of Data Reviewed Labs: ordered. Radiology: ordered.  Risk Prescription drug management.   79 yo M with a chief complaints of slurred speech facial droop going on since he woke up on Monday.  Patient also newly in atrial fibrillation.  Will obtain laboratory evaluation and CT of the head.  I received a call from the  radiologist, patient with bleeding along the falx.  He is on Plavix .  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  Both of these were at least 4 years ago.  I discussed the case with Dr. Gillie, he thought this was unlikely to be the cause of the patient's symptoms.  Recommended getting an MRI to evaluate for other cause of facial droop and slurred speech.  MRI is concerning for an acute stroke.  I discussed this with Dr. Arora, recommends medical admission.  He recommended holding off on antiplatelet or anticoagulation therapy at this time  The patients results and plan were reviewed and discussed.   Any x-rays performed were independently reviewed by myself.   Differential diagnosis were considered with the presenting HPI.  Medications  iohexol  (OMNIPAQUE ) 350 MG/ML injection 75 mL (75 mLs Intravenous Contrast Given 08/03/24 1149)    Vitals:   08/03/24 1045 08/03/24 1200  08/03/24 1230 08/03/24 1245  BP: (!) 164/75 (!) 144/76 (!) 167/90 (!) 152/83  Pulse: 81 99 71 73  Resp: 11 18 15 18   Temp:      TempSrc:      SpO2: 100% 98% 98% 98%  Weight:      Height:        Final diagnoses:  Acute ischemic stroke (HCC)  SDH (subdural hematoma) (HCC)    Admission/ observation were discussed with the admitting physician, patient and/or family and they are comfortable with the plan.       Final diagnoses:  Acute ischemic stroke [facility])  SDH (subdural hematoma) [facility])    ED Discharge Orders     None          Emil Share, DO 08/03/24 1517  "

## 2024-08-03 NOTE — Consult Note (Addendum)
 NEUROLOGY CONSULT NOTE   Date of service: August 03, 2024 Patient Name: Mark Dickerson MRN:  [id number] DOB:  April 10, 1946 Chief Complaint: slurred speech, R facial droop Requesting Provider: Emil Share, DO  History of Present Illness  Mark Dickerson is a 79 y.o. male with hx of DM2, HTN, HLD, MI, PVD, tobacco use who presents with slurred speech, R facial droop since Monday 08/01/24.  Did not come to the hospital, thinking it would go away. It seemed improved after taking a nap but today, it seems worse so family got into the ED  Patient reports that since Monday, he has been having slurring of his speech and when he would drink liquids, he has noted terrible drift from the right side of his mouth.  He denies any weakness or numbness or any other concerns at this time.  He is a current everyday smoker and smokes about a pack a day and has been doing it for over 3 decades.  He does not drink alcohol, he does not use any recreational substances.  Upon further discussion with family, it seems that last spring, he was noted to have a mass in his lung and was recommended biopsy and follow-up with oncology for concern for malignancy.  However, he did not do any of that.  Patient is very focused on maintaining his independence.  Despite him needing help, he still lives by himself.  Family cooks for him and gets his groceries and takes care of his finances.  They will deliver food to his house.  Patient denies any falls.  LKW: Monday, 08/01/2024 Modified rankin score: 2-Slight disability-UNABLE to perform all activities but does not need assistance IV Thrombolysis: Not offered, outside of window.  He is also too mild to treat.   EVT: Not offered, outside of window and too mild to treat and low suspicion for an LVO.    NIHSS components Score: Comment  1a Level of Conscious 0[]  1[]  2[]  3[]      1b LOC Questions 0[]  1[]  2[]       1c LOC Commands 0[]  1[]  2[]       2 Best Gaze 0[]  1[]  2[]        3 Visual 0[]  1[]  2[]  3[]      4 Facial Palsy 0[]  1[x]  2[]  3[]      5a Motor Arm - left 0[]  1[]  2[]  3[]  4[]  UN[]    5b Motor Arm - Right 0[]  1[]  2[]  3[]  4[]  UN[]    6a Motor Leg - Left 0[]  1[]  2[]  3[]  4[]  UN[]    6b Motor Leg - Right 0[]  1[]  2[]  3[]  4[]  UN[]    7 Limb Ataxia 0[]  1[]  2[]  UN[]      8 Sensory 0[]  1[]  2[]  UN[]      9 Best Language 0[]  1[]  2[]  3[]      10 Dysarthria 0[]  1[]  2[]  UN[]      11 Extinct. and Inattention 0[]  1[]  2[]       TOTAL: 1      ROS  Comprehensive ROS performed and pertinent positives documented in HPI   Past History   Past Medical History:  Diagnosis Date   Diabetes mellitus without complication (HCC)    Hyperlipidemia    Hypertension    Myocardial infarction (HCC)    Peripheral arterial disease    Tobacco abuse     Past Surgical History:  Procedure Laterality Date   ABDOMINAL AORTOGRAM W/LOWER EXTREMITY N/A 09/19/2020   Procedure: ABDOMINAL AORTOGRAM W/LOWER EXTREMITY;  Surgeon: Magda Debby SAILOR, MD;  Location: [facility]  INVASIVE CV LAB;  Service: Cardiovascular;  Laterality: N/A;   AMPUTATION Left 11/01/2020   Procedure: LEFT THIRD TOE AMPUTATION;  Surgeon: Magda Debby SAILOR, MD;  Location: [facility] OR;  Service: Vascular;  Laterality: Left;   AORTA - BILATERAL FEMORAL ARTERY BYPASS GRAFT N/A 09/24/2020   Procedure: AORTA BIFEMORAL BYPASS GRAFT;  Surgeon: Magda Debby SAILOR, MD;  Location: [facility] OR;  Service: Vascular;  Laterality: N/A;   AORTIC ENDARTERECETOMY N/A 09/24/2020   Procedure: AORTIC ENDARTERECETOMY;  Surgeon: Magda Debby SAILOR, MD;  Location: [facility] OR;  Service: Vascular;  Laterality: N/A;   ENDARTERECTOMY FEMORAL Bilateral 09/24/2020   Procedure: ENDARTERECTOMY BILATERAL COMMON FEMORAL ARTERIES;  Surgeon: Magda Debby SAILOR, MD;  Location: [facility] OR;  Service: Vascular;  Laterality: Bilateral;   FEMORAL-POPLITEAL BYPASS GRAFT Left 09/24/2020   Procedure: LEFT FEMORAL ARTERY TO BELOW KNEE POPLITEAL ARTERY BY PASS GRAFT USING NON REVERSED SAPPENOUS VEIN.;  Surgeon: Magda Debby SAILOR, MD;  Location: [facility] OR;  Service: Vascular;  Laterality: Left;   INTRAOPERATIVE ARTERIOGRAM Bilateral 09/24/2020   Procedure: INTRA OPERATIVE ARTERIOGRAM RIGHT AND LEFT LEG;  Surgeon: Magda Debby SAILOR, MD;  Location: [facility] OR;  Service: Vascular;  Laterality: Bilateral;   IR ANGIOGRAM EXTREMITY BILATERAL  08/31/2020   IR RADIOLOGIST EVAL & MGMT  08/14/2020   IR US  GUIDE VASC ACCESS LEFT  08/31/2020   IR US  GUIDE VASC ACCESS RIGHT  08/31/2020   VEIN HARVEST Left 09/24/2020   Procedure: VEIN HARVEST LEFT GREATER SAPPHENOUS;  Surgeon: Magda Debby SAILOR, MD;  Location: [facility] OR;  Service: Vascular;  Laterality: Left;   WOUND DEBRIDEMENT Left 11/01/2020   Procedure: DEBRIDEMENT LEFT FOOT WOUND;  Surgeon: Magda Debby SAILOR, MD;  Location[facility];  Service: Vascular;  Laterality: Left;    Family History: Family History  Problem Relation Age of Onset   Lung cancer Father     Social History  reports that he has been smoking cigarettes. He has never used smokeless tobacco. He reports that he does not currently use alcohol. He reports that he does not use drugs.  Allergies[1]  Medications  Current Medications[2]  Vitals   Vitals:   08/03/24 1015 08/03/24 1026 08/03/24 1030 08/03/24 1045  BP: (!) 178/95  (!) 160/95 (!) 164/75  Pulse: 83  75 81  Resp:   18 11  Temp:  97.6 F (36.4 C)    TempSrc:  Oral    SpO2: 100%  100% 100%  Weight:      Height:        Body mass index is 24.41 kg/m.   Physical Exam   General: Laying comfortably in bed; in no acute distress.  HENT: Normal oropharynx and mucosa. Normal external appearance of ears and nose.  Neck: Supple, no pain or tenderness  CV: No JVD. No peripheral edema.  Pulmonary: Symmetric Chest rise. Normal respiratory effort.  Abdomen: Soft to touch, non-tender.  Ext: No cyanosis, edema, contracture in Right 5th digit and Left 4th and 5th digit. Skin: No rash. Normal palpation of skin.   Musculoskeletal: Normal digits and nails by  inspection. No clubbing.   Neurologic Examination  Mental status/Cognition: Alert, oriented to self, place, month but not to year.  Fair attention, does struggle with some calculations.  Some suspicion for underlying cognitive/executive dysfunction. Speech/language: Fluent, comprehension intact, object naming intact, repetition intact.  Cranial nerves:   CN II Pupils equal and reactive to light, no VF deficits    CN III,IV,VI EOM intact, no gaze preference or deviation, no  nystagmus    CN V normal sensation in V1, V2, and V3 segments bilaterally    CN VII Slight flattening of the nasolabial fold on the right with a noted facial droop   CN VIII normal hearing to speech    CN IX & X normal palatal elevation, no uvular deviation    CN XI 5/5 head turn and 5/5 shoulder shrug bilaterally    CN XII midline tongue protrusion    Motor:  Muscle bulk: normal, tone normal, pronator drift none. tremor none. Does have  Mvmt Root Nerve  Muscle Right Left Comments  SA C5/6 Ax Deltoid 5 5   EF C5/6 Mc Biceps 5 5   EE C6/7/8 Rad Triceps 5 5   WF C6/7 Med FCR     WE C7/8 PIN ECU     F Ab C8/T1 U ADM/FDI 5 5   HF L1/2/3 Fem Illopsoas 5 4+   KE L2/3/4 Fem Quad 5 5   DF L4/5 D Peron Tib Ant 5 5   PF S1/2 Tibial Grc/Sol 5 5    Sensation:  Light touch Intact   Pin prick    Temperature    Vibration   Proprioception    Coordination/Complex Motor:  - Finger to Nose intact bilaterally - Heel to shin intact bilaterally - Rapid alternating movement are normal - Gait: Deferred for patient's safety. Labs/Imaging/Neurodiagnostic studies   CBC:  Recent Labs  Lab August 27, 2024 1033  WBC 9.1  NEUTROABS 5.9  HGB 15.1  HCT 43.9  MCV 91.8  PLT 198   Basic Metabolic Panel:  Lab Results  Component Value Date   NA 132 (L) 08/27/24   K 4.4 08-27-2024   CO2 23 27-Aug-2024   GLUCOSE 117 (H) 2024-08-27   BUN 12 08-27-2024   CREATININE 1.01 27-Aug-2024   CALCIUM  10.3 August 27, 2024   GFRNONAA >60 08/27/24    Lipid Panel:  Lab Results  Component Value Date   LDLCALC 76 09/20/2020   HgbA1c:  Lab Results  Component Value Date   HGBA1C 7.0 (H) 09/25/2020   Urine Drug Screen: No results found for: LABOPIA, COCAINSCRNUR, LABBENZ, AMPHETMU, THCU, LABBARB  Alcohol Level     Component Value Date/Time   [facility] <15 August 27, 2024 1034   INR  Lab Results  Component Value Date   INR 1.0 08/27/24   APTT  Lab Results  Component Value Date   APTT 32 08-27-2024   AED levels: No results found for: PHENYTOIN, ZONISAMIDE, LAMOTRIGINE, LEVETIRACETA  CT Head without contrast(Personally reviewed): CTH was negative for a large hypodensity concerning for a large territory infarct. Notable for small volume SDH along with falx.  CT angio Head and Neck with contrast(Personally reviewed): 1. No acute large vessel occlusion. 2. 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 3. Approximately 60% stenosis at the origin of the left cervical ICA. 4. Moderate stenosis at the origins of the bilateral vertebral arteries. 5. Moderate stenosis at the posterior vertical portion of the left cavernous ICA, with additional mild to moderate stenosis throughout the bilateral cavernous and supraclinoid ICAs. 6. Redemonstrated subdural hematoma along the anterior falx. 7. Pulmonary emphysema is an independent risk factor for lung cancer. Recommend consideration for evaluation for a low-dose CT lung cancer screening program.  MRI Brain(Personally reviewed): Left basal ganglia infarct   ASSESSMENT   Mark Dickerson is a 79 y.o. male with hx  of DM2, HTN, HLD, MI, PVD, tobacco use who presents with slurred speech, R facial droop since Monday 08/01/24.  He was found to have small volume subdural hematoma along the falx with no significant mass effect or midline shift.  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  The location and the size of the stroke suggests likely small vessel stroke.  He does have risk factors of diabetes, hypertension, hyperlipidemia, peripheral vascular disease, current everyday smoker.  RECOMMENDATIONS  - Frequent Neuro checks per stroke unit protocol - Recommend obtaining TTE  - Recommend obtaining Lipid panel with LDL - Please start statin if LDL > 70 - Recommend HbA1c to evaluate for diabetes and how well it is controlled. - Antithrombotic -hold off on antiplatelets until we can demonstrate that the subdural hematoma is stable in size. - Recommend DVT ppx - SBP goal -aim for SBP less than 160. - Recommend Telemetry monitoring for arrythmia - Recommend bedside swallow screen prior to PO intake. - Stroke education booklet - Recommend PT/OT/SLP consult - Repeat CT head around 5pm, approximately 6 hours from prior to ensure stability of the noted SDH. ______________________________________________________________________  Plan discussed with patient/family at the bedside.  Plan also discussed with internal medicine teaching service residents at the bedside.  Signed, Jensen Cheramie, MD [facility]     [1] No Known Allergies [2] No current facility-administered medications for this encounter.  Current Outpatient Medications:    acetaminophen  (TYLENOL ) 500 MG tablet, Take 1,000 mg by mouth 3 (three) times daily., Disp: , Rfl:    albuterol  (VENTOLIN  HFA) 108 (90 Base) MCG/ACT inhaler, Inhale 2 puffs into the lungs every 4 (four) hours as needed for wheezing or shortness of breath., Disp: , Rfl:    benazepril  (LOTENSIN ) 10 MG tablet, TAKE 1 TABLET BY MOUTH EVERY DAY, Disp: 90 tablet, Rfl: 1   clopidogrel  (PLAVIX ) 75 MG tablet, Take 75 mg by mouth daily., Disp: , Rfl:    Coenzyme Q10 200 MG capsule, Take 200 mg by mouth daily., Disp: , Rfl:    fenofibrate (TRICOR) 145 MG tablet, Take 145 mg by mouth daily., Disp: ,  Rfl:    gabapentin (NEURONTIN) 300 MG capsule, Take 300 mg by mouth 3 (three) times daily., Disp: , Rfl:    hydrochlorothiazide (HYDRODIURIL) 25 MG tablet, Take 25 mg by mouth daily., Disp: , Rfl:    metFORMIN (GLUCOPHAGE-XR) 500 MG 24 hr tablet, Take 500 mg by mouth daily., Disp: , Rfl:    metoprolol  succinate (TOPROL  XL) 25 MG 24 hr tablet, Take 0.5 tablets (12.5 mg total) by mouth daily., Disp: 45 tablet, Rfl: 2   nitroGLYCERIN  (NITROSTAT ) 0.4 MG SL tablet, NEW PRESCRIPTION REQUEST: PLACE ONE TABLET UNDER THE TONGUE BY MOUTH EVERY DAY AS NEEDED, Disp: 25 tablet, Rfl: 2   SYMBICORT 160-4.5 MCG/ACT inhaler, Inhale 2 puffs into the lungs 2 (two) times daily., Disp: , Rfl:    Vitamin D, Ergocalciferol, (DRISDOL) 1.25 MG (50000 UNIT) CAPS capsule, Take 50,000 Units by mouth once a week., Disp: , Rfl:

## 2024-08-03 NOTE — ED Notes (Signed)
 Patient removed external urinary catheter. Cleaned patient changed linens and applied a clean and dry brief.

## 2024-08-03 NOTE — Progress Notes (Signed)
 Patient ID: Mark Dickerson, male   DOB: August 18, 1945, 79 y.o.   MRN: [id number] BP (!) 164/75   Pulse 81   Temp 97.6 F (36.4 C) (Oral)   Resp 11   Ht 6' (1.829 m)   Wt 81.6 kg   SpO2 100%   BMI 24.41 kg/m  Head CT reviewed, this is acute and most likely the result of trauma. However this is highly unlikely to be the cause of his facial droop, or other stroke like symptoms.

## 2024-08-03 NOTE — ED Notes (Signed)
 DO Emil notified of failed swallow screen

## 2024-08-03 NOTE — H&P (Cosign Needed Addendum)
 " Date: 08/03/2024               Patient Name:  Mark Dickerson MRN: [id number]  DOB: [birth year] Age / Sex: 79 y.o., male   PCP: Keren Vicenta BRAVO, MD         Medical Service: Internal Medicine Teaching Service         Attending Physician: Dr. Mliss Pouch      First Contact: Mark Cheadle, MD    Second Contact: Dr. Missy Sandhoff, MD         Pager Information: First Contact Pager: [phone]   Second Contact Pager: [phone]   SUBJECTIVE   Chief Complaint: Dysphagia, slurred speech, facial droop  History of Present Illness: Mark Dickerson is a 79 y.o. male with PMH of HTN, HLD, T2DM, PAD, COPD, and right lung mass seen on imaging who presented to the [facility] Emergency Department for difficulty swallowing and facial droop and admitted for stroke.   On interview with the patient, he reports difficulty breathing, talking, and swallowing over the past few days. He states that his symptoms started Monday shortly after he woke up. He initially noticed drooling from the right corner of his mouth before the onset of his difficulty breathing, talking, and swallowing. He also noticed some blurry vision at the time, but denied any changes in his hearing. He also denied any changes in his sensation or strength at that time. His brother reports noticing these changes during breakfast, after which the patient rested with some resolution of his symptoms. The patient and his family report that the symptoms never fully resolved. When the patient's family checked in on him again this morning, they noticed that his breathing and aphasia had worsened over the last 24 hours, prompting them to call paramedics. They found him sitting in the chair this morning without any evidence of trauma or fall. The patient initially denied experiencing any falls, but then reported falling this morning at 1 AM. The patient's family has been concerned about the number of falls he's experienced recently and his inability to  get up after experiencing a fall. They attribute his falls to general weakness since March 2025 when he was being worked up for his lung mass. His family also reports new onset agitation and restlessness uncharacteristic of the patient. They describe him as being nervous and agitated. The patient denies any other symptoms currently including chest pain, palpitations, headache, leg swelling, shortness of breath, palpitations, dysuria, or urinary frequency. He endorses feeling lightheaded and dizzy when standing, but is unable to explain any further at this time. He also reports difficulty swallowing water and food at the current moment. He has not had much to eat since his symptoms started and his last bowel movement was 48 hours ago, normally going every 48-72 hours. He or his family report no other concerns at this time.  ED Course:  Labs significant for: - CBG 128 - PT/INR 14.0/1.0 - APTT 32 - CBC w/diff and CMP stable - EtOH unremarkable  Imaging: - CXR: masslike opacity over the right infrahilar region measuring approximately 5.4 x 7.2 cm - CT noncon: acute subdural hematoma along the frontal falx cerebri measuring 5 mm and chronic ischemic microvascular disease/old lacunar infarcts over the right basal ganglia and left caudate nucleus - CT angio:   - No acute large vessel occlusion   - Approximately 90% narrowing of the distal right common carotid artery   - Approximately 60% stenosis at the origin of the  left cervical ICA   - Moderate stenosis at the origins of the bilateral vertebral arteries   - Moderate stenosis at the posterior vertical portion of the left cavernous ICA - Mild to moderate stenosis throughout the bilateral cavernous and supraclinoid ICAs - MRI brain noncon: small acute infarct in the left basal ganglia and moderate chronic microvascular ischemic change   Received no medications  Consulted neurology, neurosurgery, and IMTS  Meds:  Patient reported taking unless  otherwise stated: Acetaminophen  1000 mg 3 times daily as needed Albuterol  inhaler 2 puffs every 4 hours for wheezing or SOB Amlodipine  5 mg daily Benazepril  (Lotensin ) 40 mg daily  Plavix  75 mg daily - Patient unsure if he is taking Coenzyme Q 10 200 mg daily - Patient unsure if he is taking Fenofibrate 145 mg daily - Patient unsure if he is taking Gabapentin 300 mg 3 times daily - Patient unsure if he is taking Hydrochlorothiazide 25 mg daily - Patient unsure if he is taking Metformin 500 mg daily Metoprolol  succinate 50 mg daily Nitroglycerin  0.4 mg sublingual - Patient unsure if he is taking Pravastatin 20 mg daily Vitamin D 50,000 units weekly  Active Medications[1]  Past Medical History Past Medical History:  Diagnosis Date   Diabetes mellitus without complication (HCC)    Hyperlipidemia    Hypertension    Myocardial infarction (HCC)    Peripheral arterial disease    Tobacco abuse   - NSTEMI  Past Surgical History Past Surgical History:  Procedure Laterality Date   ABDOMINAL AORTOGRAM W/LOWER EXTREMITY N/A 09/19/2020   Procedure: ABDOMINAL AORTOGRAM W/LOWER EXTREMITY;  Surgeon: Magda Debby SAILOR, MD;  Location: [facility];  Service: Cardiovascular;  Laterality: N/A;   AMPUTATION Left 11/01/2020   Procedure: LEFT THIRD TOE AMPUTATION;  Surgeon: Magda Debby SAILOR, MD;  Location: [facility] OR;  Service: Vascular;  Laterality: Left;   AORTA - BILATERAL FEMORAL ARTERY BYPASS GRAFT N/A 09/24/2020   Procedure: AORTA BIFEMORAL BYPASS GRAFT;  Surgeon: Magda Debby SAILOR, MD;  Location: [facility] OR;  Service: Vascular;  Laterality: N/A;   AORTIC ENDARTERECETOMY N/A 09/24/2020   Procedure: AORTIC ENDARTERECETOMY;  Surgeon: Magda Debby SAILOR, MD;  Location: [facility] OR;  Service: Vascular;  Laterality: N/A;   ENDARTERECTOMY FEMORAL Bilateral 09/24/2020   Procedure: ENDARTERECTOMY BILATERAL COMMON FEMORAL ARTERIES;  Surgeon: Magda Debby SAILOR, MD;  Location: [facility] OR;  Service: Vascular;  Laterality:  Bilateral;   FEMORAL-POPLITEAL BYPASS GRAFT Left 09/24/2020   Procedure: LEFT FEMORAL ARTERY TO BELOW KNEE POPLITEAL ARTERY BY PASS GRAFT USING NON REVERSED SAPPENOUS VEIN.;  Surgeon: Magda Debby SAILOR, MD;  Location: [facility] OR;  Service: Vascular;  Laterality: Left;   INTRAOPERATIVE ARTERIOGRAM Bilateral 09/24/2020   Procedure: INTRA OPERATIVE ARTERIOGRAM RIGHT AND LEFT LEG;  Surgeon: Magda Debby SAILOR, MD;  Location: [facility] OR;  Service: Vascular;  Laterality: Bilateral;   IR ANGIOGRAM EXTREMITY BILATERAL  08/31/2020   IR RADIOLOGIST EVAL & MGMT  08/14/2020   IR US  GUIDE VASC ACCESS LEFT  08/31/2020   IR US  GUIDE VASC ACCESS RIGHT  08/31/2020   VEIN HARVEST Left 09/24/2020   Procedure: VEIN HARVEST LEFT GREATER SAPPHENOUS;  Surgeon: Magda Debby SAILOR, MD;  Location: [facility] OR;  Service: Vascular;  Laterality: Left;   WOUND DEBRIDEMENT Left 11/01/2020   Procedure: DEBRIDEMENT LEFT FOOT WOUND;  Surgeon: Magda Debby SAILOR, MD;  Location: [facility] OR;  Service: Vascular;  Laterality: Left;     Social:  Lives With: Alone in [place]  Occupation: Retired, used to be a  estate agent Support: 3 brothers and sister-in-law Level of Function: Performs ADLs independently, but needs assistance with IADLs PCP: Keren Vicenta BRAVO, MD  Substances: -Tobacco: current smoker - 1ppd x 60+ years -Alcohol: denies current use; endorses past use, but unable to explain further -Recreational Drug: denies any past or current recreational drug use  Family History:  Family History  Problem Relation Age of Onset   Lung cancer Father      Allergies: Allergies as of 08/03/2024   (No Known Allergies)    Review of Systems: A complete ROS was negative except as per HPI.   OBJECTIVE:   Physical Exam:  Blood pressure (!) 152/83, pulse 73, temperature 97.6 F (36.4 C), temperature source Oral, resp. rate 18, height 6' (1.829 m), weight 81.6 kg, SpO2 98%.  Physical Exam Vitals reviewed.  Constitutional:      General: He is not in  acute distress.    Appearance: He is not ill-appearing.  HENT:     Head: Normocephalic and atraumatic.     Mouth/Throat:     Mouth: Mucous membranes are moist.     Pharynx: No oropharyngeal exudate or posterior oropharyngeal erythema.  Eyes:     General: No scleral icterus.    Extraocular Movements: Extraocular movements intact.     Conjunctiva/sclera: Conjunctivae normal.     Pupils: Pupils are equal, round, and reactive to light.  Cardiovascular:     Rate and Rhythm: Normal rate. Rhythm irregular.     Pulses: Normal pulses.     Heart sounds: Normal heart sounds. No murmur heard.    No friction rub. No gallop.  Pulmonary:     Effort: Pulmonary effort is normal. No respiratory distress.     Breath sounds: Stridor (inspiratory) present. Wheezing (expiratory) present. No rhonchi.  Abdominal:     General: Abdomen is flat. There is no distension.     Palpations: Abdomen is soft.     Tenderness: There is no abdominal tenderness.  Musculoskeletal:        General: No tenderness.     Right lower leg: No edema.     Left lower leg: No edema.     Left Lower Extremity: (amputation of the third toe on the left foot) Skin:    General: Skin is warm and dry.  Neurological:     Mental Status: He is confused.     Cranial Nerves: Cranial nerve deficit, dysarthria and facial asymmetry present.     Sensory: Sensation is intact. No sensory deficit.     Motor: Weakness (5/5 strength in bilateral proximal and distal upper extremities, 4/5 grip strength bilaterally; 5/5 strength in bilateral proximal and distal lower extremities) present.     Coordination: Coordination is intact. Coordination normal. Finger-Nose-Finger Test and Heel to [facility] Test normal.    Labs: CBC    Component Value Date/Time   WBC 9.1 08/03/2024 1033   RBC 4.78 08/03/2024 1033   HGB 15.1 08/03/2024 1033   HCT 43.9 08/03/2024 1033   PLT 198 08/03/2024 1033   MCV 91.8 08/03/2024 1033   MCH 31.6 08/03/2024 1033   MCHC 34.4  08/03/2024 1033   RDW 15.2 08/03/2024 1033   LYMPHSABS 1.8 08/03/2024 1033   MONOABS 0.7 08/03/2024 1033   EOSABS 0.7 (H) 08/03/2024 1033   BASOSABS 0.1 08/03/2024 1033     CMP     Component Value Date/Time   NA 132 (L) 08/03/2024 1033   NA 134 10/12/2020 1124   K 4.4 08/03/2024 1033  CL 95 (L) 08/03/2024 1033   CO2 23 08/03/2024 1033   GLUCOSE 117 (H) 08/03/2024 1033   BUN 12 08/03/2024 1033   BUN 15 10/12/2020 1124   CREATININE 1.01 08/03/2024 1033   CALCIUM  10.3 08/03/2024 1033   PROT 7.8 08/03/2024 1033   ALBUMIN  4.4 08/03/2024 1033   AST 31 08/03/2024 1033   ALT 7 08/03/2024 1033   ALKPHOS 69 08/03/2024 1033   BILITOT 1.0 08/03/2024 1033   GFRNONAA >60 08/03/2024 1033   - CBG 128 - PT/INR 14.0/1.0 - APTT 32 - EtOH unremarkable  Imaging:  CT HEAD POST STROKE FOLLOWUP/TIMED/STAT READ Result Date: 08/03/2024 EXAM: CT HEAD WITHOUT CONTRAST 08/03/2024 05:07:59 PM TECHNIQUE: CT of the head was performed without the administration of intravenous contrast. Automated exposure control, iterative reconstruction, and/or weight based adjustment of the mA/kV was utilized to reduce the radiation dose to as low as reasonably achievable. COMPARISON: 08/03/2024 CLINICAL HISTORY: Neuro deficit, acute, stroke suspected FINDINGS: BRAIN AND VENTRICLES: Stable 5 mm parafalcine subdural hemorrhage. No new or enlarging foci of intracranial hemorrhage. 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 Chronic microvascular ischemic changes and mild parenchymal volume loss. No evidence of acute infarct. No hydrocephalus. No mass effect or midline shift. ORBITS: Bilateral lens replacement. SINUSES: No acute abnormality. SOFT TISSUES AND SKULL: No acute soft tissue abnormality. No skull fracture. IMPRESSION: 1. Stable 5 mm parafalcine subdural hemorrhage. 2. No new or enlarging intracranial hemorrhage. Electronically signed by: Donnice Mania MD 08/03/2024 05:15 PM EST RP Workstation: [id number]   MR BRAIN  WO CONTRAST Result Date: 08/03/2024 EXAM: MRI Brain Without Contrast 08/03/2024 01:38:21 PM TECHNIQUE: Multiplanar multisequence MRI of the head/brain was performed without the administration of intravenous contrast. COMPARISON: None available. CLINICAL HISTORY: Neuro deficit, acute, stroke suspected FINDINGS: BRAIN AND VENTRICLES: Small acute infarct in the left basal ganglia. No intracranial hemorrhage. No mass. No midline shift. No hydrocephalus. The sella is unremarkable. Normal flow voids. Moderate patchy T2 hyperintensities in the white matter, compatible with chronic microvascular ischemic disease. ORBITS: No significant abnormality. SINUSES AND MASTOIDS: No significant abnormality. BONES AND SOFT TISSUES: Normal marrow signal. No soft tissue abnormality. IMPRESSION: 1. Small acute infarct in the left basal ganglia. 2. Moderate chronic microvascular ischemic change. Electronically signed by: Glendia Molt MD 08/03/2024 02:46 PM EST RP Workstation: [id number]   CT ANGIO HEAD NECK W WO CM Result Date: 08/03/2024 EXAM: CTA HEAD AND NECK WITH AND WITHOUT 08/03/2024 11:49:09 AM TECHNIQUE: CTA of the head and neck was performed with and without the administration of 75 mL of intravenous contrast (iohexol  (OMNIPAQUE ) 350 MG/ML injection). Multiplanar 2D and/or 3D reformatted images are provided for review. Automated exposure control, iterative reconstruction, and/or weight based adjustment of the mA/kV was utilized to reduce the radiation dose to as low as reasonably achievable. Stenosis of the internal carotid arteries measured using NASCET criteria. COMPARISON: CT head dated 08/03/2024 11:49:09 AM CLINICAL HISTORY: Stroke/TIA, determine embolic source. FINDINGS: CTA NECK: AORTIC ARCH AND ARCH VESSELS: Mild atherosclerosis of the aortic arch. Atherosclerosis involving the left subclavian artery origin resulting in mild stenosis. Additional atherosclerosis along the brachiocephalic artery and proximal right  subclavian artery resulting in mild to moderate stenosis of the proximal right subclavian artery. No dissection or arterial injury. CERVICAL CAROTID ARTERIES: Right carotid bifurcation: Prominent calcified atherosclerosis. Severe stenosis at the distal aspect of the right common carotid artery just prior to the bifurcation. Severe stenosis at the origin of the right cervical ICA with approximately 90% narrowing of the vessel. Additional atherosclerosis  along the right carotid bulb which results in approximately 75% stenosis. Left carotid bifurcation: Prominent calcified atherosclerosis. Approximately 60% stenosis at the origin of the left cervical ICA with additional atherosclerosis along the left carotid bulb resulting in similar stenosis. No dissection or arterial injury. CERVICAL VERTEBRAL ARTERIES: The right vertebral artery is dominant. Bulky calcified atherosclerosis at the origin of the right vertebral artery resulting in moderate stenosis. Atherosclerosis at the origin of the nondominant left vertebral artery also resulting in moderate stenosis. Atherosclerosis of the right V4 segment resulting in mild stenosis. No dissection or arterial injury. LUNGS AND MEDIASTINUM: Centrilobular emphysema in the lung apices. SOFT TISSUES: Edentulous maxilla. BONES: Degenerative changes throughout the visualized spine with disc space narrowing and prominent degenerative endplate osteophytes most pronounced at C4-C5. There is facet arthrosis at multiple levels throughout the cervical spine greatest on the left at C2-C3 and C3-C4. 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 There is moderate stenosis at the posterior vertical portion of the left cavernous ICA. Additional mild to moderate stenosis throughout the bilateral cavernous and supraclinoid ICAs. No significant stenosis of the anterior cerebral arteries. No significant stenosis of the middle  cerebral arteries. No aneurysm. POSTERIOR CIRCULATION: No significant stenosis of the posterior cerebral arteries. No significant stenosis of the basilar artery. No significant stenosis of the intracranial vertebral arteries. No aneurysm. OTHER: Redemonstrated subdural hematoma along the anterior falx. No dural venous sinus thrombosis on this non-dedicated study. IMPRESSION: 1. No acute large vessel occlusion. 2. 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 3. Approximately 60% stenosis at the origin of the left cervical ICA. 4. Moderate stenosis at the origins of the bilateral vertebral arteries. 5. Moderate stenosis at the posterior vertical portion of the left cavernous ICA, with additional mild to moderate stenosis throughout the bilateral cavernous and supraclinoid ICAs. 6. Redemonstrated subdural hematoma along the anterior falx. 7. Pulmonary emphysema is an independent risk factor for lung cancer. Recommend consideration for evaluation for a low-dose CT lung cancer screening program. Electronically signed by: Donnice Mania MD 08/03/2024 12:16 PM EST RP Workstation: [id number]   DG Chest Port 1 View Result Date: 08/03/2024 CLINICAL DATA:  Hypoxia. EXAM: PORTABLE CHEST 1 VIEW COMPARISON:  03/31/2024 FINDINGS: Lungs are somewhat hypoinflated. Increased size of masslike opacity over the right infrahilar region measuring approximately 5.4 x 7.2 cm. Subcentimeter rounded nodular opacity over the left lung base which may represent the left nipple. No effusion. Cardiomediastinal silhouette and remainder of the exam is unchanged. IMPRESSION: 1. Increased size of masslike opacity over the right infrahilar region measuring approximately 5.4 x 7.2 cm. Recommend further evaluation with chest CT with contrast. 2. Subcentimeter rounded nodular opacity over the left lung base which may represent the  left nipple. This can be evaluated on the chest CT. Electronically Signed   By: Toribio Agreste M.D.   On: 08/03/2024 11:18   CT HEAD WO CONTRAST Result Date: 08/03/2024 CLINICAL DATA:  Slurred speech and facial droop with urinary incontinence a few days. Symptoms began Monday morning. EXAM: CT HEAD WITHOUT CONTRAST TECHNIQUE: Contiguous axial images were obtained from the base of the skull through the vertex without intravenous contrast. RADIATION DOSE REDUCTION: This exam was performed according to the departmental dose-optimization program which includes automated exposure control, adjustment of the mA and/or kV according to patient size and/or use of iterative reconstruction technique.  COMPARISON:  03/31/2024 FINDINGS: Brain: Examination demonstrates acute subdural hemorrhage along the high frontal parietal falx cerebri. This measures 5 mm in thickness. There is no significant mass effect or midline shift. Ventricles and cisterns are otherwise normal. There is chronic ischemic microvascular disease present. Old lacunar infarct over the right basal ganglia. Small old lacunar infarct over the left caudate nucleus. Mild prominence of the cisterna magna. Vascular: No hyperdense vessel or unexpected calcification. Skull: Normal. Negative for fracture or focal lesion. Sinuses/Orbits: No acute finding. Other: None. IMPRESSION: 1. Acute subdural hematoma along the high frontal parietal falx cerebri measuring 5 mm in thickness. No significant mass effect or midline shift. 2. 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 Critical Value/emergent results were called by telephone at the time of interpretation on 08/03/2024 at 11:13 am to provider DAN FLOYD , who verbally acknowledged these results. Electronically Signed   By: Toribio Agreste M.D.   On: 08/03/2024 11:13     EKG: personally reviewed my interpretation is atrial fibrillation with regular rate and Qtc of 462. Prior EKG shows NSR  with Qtc of 408.  ASSESSMENT & PLAN:   Assessment & Plan by Problem: Principal Problem:   Acute stroke of basal ganglia (HCC) Active Problems:   Hypertension   Hyperlipidemia   Type 2 diabetes mellitus with complication, without long-term current use of insulin  (HCC)   Subdural hematoma (HCC)   Atrial fibrillation (HCC)   Mark Dickerson is a 79 y.o. person living with a PMH of HTN, HLD, T2DM, PAD, COPD, and suspected right lung mass who presented to the [facility] Emergency Department for dysphagia and facial droop and admitted for left basal ganglia CVA on hospital day 0.  #Acute stroke of left basal ganglia Patient presents with a 48 hour history of worsening dysarthria, dysphagia, and difficulty breathing concerning for stroke. Patient has an extensive history of risk factors including extensive smoking history, HTN, HLD, T2DM, and PAD. Labs including CBC, CMP, coagulation studies, and glucose were largely unremarkable. Imaging in the ED showed acute subdural hematoma along the frontal falx cerebri measuring 5 cm on noncontrast CT, 90% narrowing of the distal RCA on CT angiography, and small acute infarct in the left basal ganglia on noncontrast MRI. Neurosurgery was consulted for the hematoma and stated that deficits are likely not from hematoma. Neurology consult likens etiology of patient's presentation to acute small vessel stroke. Most likely etiology of the patient's symptoms, lab work, and imaging is left basal ganglia infarct with subsequent aphasia and dysarthria. Suspect embolic as patient in atrial fibrillation upon presentation to the ED. Workup still ongoing. - Neurology consulted, appreciate recs  - Neuro checks q4h  - Order TTE  - Order lipid panel with LDL   - Start statin if LDL < 70  - Order A1C  - Hold antiplatelet therapy d/t SDH  - Start DVT prophylaxis  - SBP goal < 160  - Continuous cardiac monitoring  - Repeat CT scan of the head 6 hours from prior scan -  Bedside swallow conducted - patient did not pass - Consult PT/OT/SLP  #Subdural hematoma #Fall on antiplatelet therapy Patient presents 12 hours after a possible fall on antiplatelet therapy. Based on patient history, unable to ascertain with 100% certainty if patient fell or has been on Plavix  recently given conflicting reports from the patient and his family. Patient without symptoms of increased ICP or bleeding including headache, nausea, vomiting, or changes in CBC. CT can of the  head showed small subdural hematoma in the frontal falx cerebri area. Neurosurgery was consulted and stated that patient's current deficits are likely unrelated to hematoma. Most likely etiology of hematoma is trauma after possible fall or incidental finding. - Neurosurgery consulted, appreciate recs - Neurology consulted, appreciate recs  - Repeat CT scan of the head 6 hours from prior scan to assess stability of hematoma  - Neurology recs as listed above  #New-onset atrial fibrillation without RVR Patient presents with new-onset A-fib without RVR. No prior history of RVR or anticoagulation. Patient is currently asymptomatic, denying any chest pain, palpitations, or shortness of breath. Current etiology of patient's new-onset A-fib is likely multifactorial including dehydration and recent fall. Current goal is rehydrate patient and symptomatically monitor patient for changes in rate. We will hold home beta blocker while pending SLP eval for swallowing function. - Continuous cardiac monitoring - Hold home metoprolol  succinate 50 mg daily  #COPD #Tobacco Use Disorder #R Lower Lobe Lung Mass Patient presented with difficultly breathing, specifically moving air through the trachea. Denies any shortness of breath. Patient and his family initially thought the difficult was because of his smoking history. Patient has a 60+ year history of smoking 1 ppd. Diagnosed with COPD and treated with symbicort and albuterol  daily. On  exam, patient has inspiratory stridor and mild expiratory wheezes. CXR shows right infrahilar lung mass that was previously being worked up by [facility] [employer] pulmonology (Dr. Karena). Patient declined a biopsy and treatment for mass suspected of being lung cancer with PCP and specialist follow up in 2025. His main concern is how a cancer diagnosis would affect his independence. - Home albuterol  inhaler prn  #History of Falls Per family patient has had multiple falls for the past 8-9 months. He has been steadily losing weight despite adequate oral intake per family. Family is concerned with how weak he is. His sister in law witnessed a fall several months ago that she thinks was due to his weakness. No tripping reported or other indications of a mechanical fall. He does not have any rugs or stairs in his home. SDH could certainly be sign of previous fall. Will check for orthostasis while he is here and have PT/OT work with him.  - Orthostatic vitals pending - PT/OT eval  #Type 2 diabetes mellitus Patient presents with a history of diabetes controlled with metformin 500 mg daily. Last A1C in 2022 was 6.4, repeat pending. Patient denies any current symptoms or complications. Will hold off on SSI while patient is NPO and awaiting further labs. Will consider sensitive SSI. - Hold home metformin - Repeat A1c pending  #Hypertension Patient has a history of hypertension managed with a multimedication regimen. Currently on amlodipine , metoprolol , and benazepril . Since admission, blood pressures have been hypertensive ranging in the 140s-170s. Neurology recommends keeping SBP below 160. If pressures consistently elevated will add on amlodipine . - Hold amlodipine , benazepril , metoprolol  and hydrochlorothiazide  #Hyperlipidemia #PAD #History of NSTEMI Patient has a history of hyperlipidemia managed with pravastatin as well as significant PAD requiring post aortobifemoral bypass and left femoral to below-knee  popliteal artery bypass in March of 2022. Shortly after patient experience NSTEMI without subsequent catheterization per patient request. Patient started on antiplatelet therapy after revascularization. Patient currently is asymptomatic without any chest pain or palpitations and EKG does not show any new infarct. Given recent stroke and extensive atherosclerosis on imaging, will need to switch statin from pravastain to high-intensity statin if LDL not at goal. - Hold home antiplatelet therapy -  Lipid panel pending  Best practice: Diet: NPO VTE: Enoxaparin  IVF: NS,Bolus Code: Full  Disposition planning: Prior to Admission Living Arrangement: Home, living alone in [place] Anticipated Discharge Location: SNF  Dispo: Admit patient to Inpatient with expected length of stay greater than 2 midnights.  Signed: Tobie Elia GAILS, Medical Student Internal Medicine Teaching Service 08/03/2024, 5:20 PM  On Call pager: [phone]  Attestation for Student Documentation:  I personally was present and re-performed the history, physical exam and medical decision-making activities of this service and have verified that the service and findings are accurately documented in the students note.  [name], MD 08/03/2024, 5:52 PM     [1]  No outpatient medications have been marked as taking for the 08/03/24 encounter [facility] Encounter).   "

## 2024-08-03 NOTE — ED Notes (Signed)
 Report called to [facility] RN on 3W

## 2024-08-03 NOTE — ED Notes (Signed)
 XR at bedside

## 2024-08-03 NOTE — ED Triage Notes (Signed)
 Patient arrives via [facility] for stroke like symptoms, slurred speech, facial droop, urinary incontinence x a few days. 2200 Sunday LKW. Woke up at 0700 on Monday with symptoms. Sister in law came today and called ems. No thinners. No previous hx of stroke. Hx of COPD and DM 2.   EMS vitals 186/84 - endorses compliance with meds HR 70 NSR 86 on room air placed on 2L up to 96% CBG 108 CO2 30 RR 17  20 LAC -- no meds en route

## 2024-08-04 ENCOUNTER — Inpatient Hospital Stay (HOSPITAL_COMMUNITY)

## 2024-08-04 DIAGNOSIS — I69391 Dysphagia following cerebral infarction: Secondary | ICD-10-CM

## 2024-08-04 DIAGNOSIS — I6200 Nontraumatic subdural hemorrhage, unspecified: Secondary | ICD-10-CM | POA: Diagnosis not present

## 2024-08-04 DIAGNOSIS — W19XXXA Unspecified fall, initial encounter: Secondary | ICD-10-CM | POA: Diagnosis not present

## 2024-08-04 DIAGNOSIS — I10 Essential (primary) hypertension: Secondary | ICD-10-CM

## 2024-08-04 DIAGNOSIS — E669 Obesity, unspecified: Secondary | ICD-10-CM

## 2024-08-04 DIAGNOSIS — I6389 Other cerebral infarction: Secondary | ICD-10-CM

## 2024-08-04 DIAGNOSIS — Z7984 Long term (current) use of oral hypoglycemic drugs: Secondary | ICD-10-CM | POA: Diagnosis not present

## 2024-08-04 DIAGNOSIS — Z8679 Personal history of other diseases of the circulatory system: Secondary | ICD-10-CM

## 2024-08-04 DIAGNOSIS — Z6824 Body mass index (BMI) 24.0-24.9, adult: Secondary | ICD-10-CM

## 2024-08-04 DIAGNOSIS — F17210 Nicotine dependence, cigarettes, uncomplicated: Secondary | ICD-10-CM | POA: Diagnosis not present

## 2024-08-04 DIAGNOSIS — I4891 Unspecified atrial fibrillation: Secondary | ICD-10-CM

## 2024-08-04 DIAGNOSIS — E785 Hyperlipidemia, unspecified: Secondary | ICD-10-CM | POA: Diagnosis not present

## 2024-08-04 DIAGNOSIS — J449 Chronic obstructive pulmonary disease, unspecified: Secondary | ICD-10-CM | POA: Diagnosis not present

## 2024-08-04 DIAGNOSIS — R918 Other nonspecific abnormal finding of lung field: Secondary | ICD-10-CM | POA: Diagnosis not present

## 2024-08-04 DIAGNOSIS — E1151 Type 2 diabetes mellitus with diabetic peripheral angiopathy without gangrene: Secondary | ICD-10-CM

## 2024-08-04 LAB — LIPID PANEL
Cholesterol: 181 mg/dL (ref 0–200)
HDL: 46 mg/dL
LDL Cholesterol: 109 mg/dL — ABNORMAL HIGH (ref 0–99)
Total CHOL/HDL Ratio: 3.9 ratio
Triglycerides: 127 mg/dL
VLDL: 25 mg/dL (ref 0–40)

## 2024-08-04 LAB — ECHOCARDIOGRAM COMPLETE
AR max vel: 1.97 cm2
AV Area VTI: 2.12 cm2
AV Area mean vel: 1.75 cm2
AV Mean grad: 4 mmHg
AV Peak grad: 7.1 mmHg
Ao pk vel: 1.33 m/s
Calc EF: 59 %
Height: 72 in
S' Lateral: 2.9 cm
Single Plane A2C EF: 55.1 %
Single Plane A4C EF: 59.8 %
Weight: 2880 [oz_av]

## 2024-08-04 LAB — HEMOGLOBIN A1C
Hgb A1c MFr Bld: 6.1 % — ABNORMAL HIGH (ref 4.8–5.6)
Mean Plasma Glucose: 128.37 mg/dL

## 2024-08-04 LAB — CBC
HCT: 44.8 % (ref 39.0–52.0)
Hemoglobin: 15.6 g/dL (ref 13.0–17.0)
MCH: 31.8 pg (ref 26.0–34.0)
MCHC: 34.8 g/dL (ref 30.0–36.0)
MCV: 91.2 fL (ref 80.0–100.0)
Platelets: 173 K/uL (ref 150–400)
RBC: 4.91 MIL/uL (ref 4.22–5.81)
RDW: 15.3 % (ref 11.5–15.5)
WBC: 8.6 K/uL (ref 4.0–10.5)
nRBC: 0 % (ref 0.0–0.2)

## 2024-08-04 LAB — BASIC METABOLIC PANEL WITH GFR
Anion gap: 15 (ref 5–15)
BUN: 16 mg/dL (ref 8–23)
CO2: 24 mmol/L (ref 22–32)
Calcium: 10.2 mg/dL (ref 8.9–10.3)
Chloride: 94 mmol/L — ABNORMAL LOW (ref 98–111)
Creatinine, Ser: 1.02 mg/dL (ref 0.61–1.24)
GFR, Estimated: >60 mL/min
Glucose, Bld: 114 mg/dL — ABNORMAL HIGH (ref 70–99)
Potassium: 4.2 mmol/L (ref 3.5–5.1)
Sodium: 133 mmol/L — ABNORMAL LOW (ref 135–145)

## 2024-08-04 MED ORDER — DEXTROSE 50 % IV SOLN: 1.0000 | Freq: Three times a day (TID) | INTRAVENOUS | Status: DC | PRN

## 2024-08-04 MED ORDER — HEPARIN SODIUM (PORCINE) 5000 UNIT/ML IJ SOLN
5000.0000 [IU] | Freq: Three times a day (TID) | INTRAMUSCULAR | Status: DC
  Administered 2024-08-04 – 2024-08-09 (×14): 5000 [IU] via SUBCUTANEOUS
  Filled 2024-08-04 (×14): qty 1

## 2024-08-04 MED ORDER — NICOTINE 14 MG/24HR TD PT24
14.0000 mg | MEDICATED_PATCH | Freq: Every day | TRANSDERMAL | Status: DC
  Administered 2024-08-04 – 2024-08-17 (×14): 14 mg via TRANSDERMAL
  Filled 2024-08-04 (×14): qty 1

## 2024-08-04 MED ORDER — ROSUVASTATIN CALCIUM 20 MG PO TABS
20.0000 mg | ORAL_TABLET | Freq: Every day | ORAL | Status: DC
  Administered 2024-08-05 – 2024-08-07 (×3): 20 mg via ORAL
  Filled 2024-08-04 (×4): qty 1

## 2024-08-04 MED ORDER — SODIUM CHLORIDE 0.9 % IV BOLUS
1000.0000 mL | Freq: Four times a day (QID) | INTRAVENOUS | Status: AC
  Administered 2024-08-04 (×2): 1000 mL via INTRAVENOUS

## 2024-08-04 NOTE — Progress Notes (Signed)
 PT Cancellation Note  Patient Details Name: Joshia Kitchings MRN: [id number] DOB: [birth year]   Cancelled Treatment:    Reason Eval/Treat Not Completed: Other (comment) (Pt being examined by MD residents. Will follow up later.)   Blakley Michna 08/04/2024, 8:58 AM

## 2024-08-04 NOTE — TOC Initial Note (Signed)
 Transition of Care [facility]) - Initial/Assessment Note    Patient Details  Name: Mark Dickerson MRN: [id number] Date of Birth: 15-Dec-1945  Transition of Care [facility]) CM/SW Contact:    Andrez JULIANNA George, RN Phone Number: 08/04/2024, 3:38 PM  Clinical Narrative:                 Katrell Milhorn is a 79 y.o. male with PMH of HTN, HLD, T2DM, PAD, COPD, and right lung mass seen on imaging who presented to the [facility] Emergency Department for aphasia and facial droop and admitted for stroke.   Pt is from home alone. He says his brother can check on him.  Pt drives but brother can assist.  Pt manages his own medications at home.   Current recommendations are for CIR.  ICM following for discharge destination.  Expected Discharge Plan: IP Rehab Facility Barriers to Discharge: Continued Medical Work up   Patient Goals and CMS Choice   CMS Medicare.gov Compare Post Acute Care list provided to:: Patient Choice offered to / list presented to : Patient      Expected Discharge Plan and Services   Discharge Planning Services: CM Consult Post Acute Care Choice: IP Rehab Living arrangements for the past 2 months: Single Family Home                                      Prior Living Arrangements/Services Living arrangements for the past 2 months: Single Family Home Lives with:: Self Patient language and need for interpreter reviewed:: Yes Do you feel safe going back to the place where you live?: Yes          Current home services: DME (cane/ walker) Criminal Activity/Legal Involvement Pertinent to Current Situation/Hospitalization: No - Comment as needed  Activities of Daily Living      Permission Sought/Granted                  Emotional Assessment Appearance:: Appears stated age Attitude/Demeanor/Rapport: Engaged Affect (typically observed): Accepting Orientation: : Oriented to Self, Oriented to Place   Psych Involvement: No (comment)  Admission diagnosis:   SDH (subdural hematoma) (HCC) [S06.5XAA] Acute ischemic stroke (HCC) [I63.9] Acute stroke of basal ganglia [facility]) [I63.9] Patient Active Problem List   Diagnosis Date Noted   Acute stroke of basal ganglia (HCC) 08/03/2024   Subdural hematoma (HCC) 08/03/2024   Atrial fibrillation (HCC) 08/03/2024   Hypertension 10/12/2020   Hyperlipidemia 10/12/2020   Type 2 diabetes mellitus with complication, without long-term current use of insulin  (HCC) 10/12/2020   Non-ST elevation (NSTEMI) myocardial infarction (HCC) 10/12/2020   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

## 2024-08-04 NOTE — Progress Notes (Signed)
 OT Cancellation Note  Patient Details Name: Mark Dickerson MRN: [id number] DOB: 16-Dec-1945   Cancelled Treatment:    Reason Eval/Treat Not Completed: Other (comment) (with SLP) will try again as able.  Etta NOVAK, OT Acute Rehabilitation Services Office [phone] Secure Chat Preferred    Etta GORMAN Hope 08/04/2024, 10:44 AM

## 2024-08-04 NOTE — Progress Notes (Signed)
 PT Cancellation Note  Patient Details Name: Mark Dickerson MRN: [id number] DOB: April 22, 1946   Cancelled Treatment:    Reason Eval/Treat Not Completed: Patient at procedure or test/unavailable (Pt now working with SLP. Will again try to follow up.)   Shantara Goosby 08/04/2024, 10:20 AM

## 2024-08-04 NOTE — Evaluation (Signed)
 Clinical/Bedside Swallow Evaluation Patient Details  Name: Mark Dickerson MRN: [id number] Date of Birth: 1946/06/20  Today's Date: 08/04/2024 Time: SLP Start Time (ACUTE ONLY): 1013 SLP Stop Time (ACUTE ONLY): 1028 SLP Time Calculation (min) (ACUTE ONLY): 15 min  Past Medical History:  Past Medical History:  Diagnosis Date   Diabetes mellitus without complication (HCC)    Hyperlipidemia    Hypertension    Myocardial infarction (HCC)    Peripheral arterial disease    Tobacco abuse    Past Surgical History:  Past Surgical History:  Procedure Laterality Date   ABDOMINAL AORTOGRAM W/LOWER EXTREMITY N/A 09/19/2020   Procedure: ABDOMINAL AORTOGRAM W/LOWER EXTREMITY;  Surgeon: Magda Debby SAILOR, MD;  Location: [facility] INVASIVE CV LAB;  Service: Cardiovascular;  Laterality: N/A;   AMPUTATION Left 11/01/2020   Procedure: LEFT THIRD TOE AMPUTATION;  Surgeon: Magda Debby SAILOR, MD;  Location: [facility] OR;  Service: Vascular;  Laterality: Left;   AORTA - BILATERAL FEMORAL ARTERY BYPASS GRAFT N/A 09/24/2020   Procedure: AORTA BIFEMORAL BYPASS GRAFT;  Surgeon: Magda Debby SAILOR, MD;  Location: [facility] OR;  Service: Vascular;  Laterality: N/A;   AORTIC ENDARTERECETOMY N/A 09/24/2020   Procedure: AORTIC ENDARTERECETOMY;  Surgeon: Magda Debby SAILOR, MD;  Location: [facility] OR;  Service: Vascular;  Laterality: N/A;   ENDARTERECTOMY FEMORAL Bilateral 09/24/2020   Procedure: ENDARTERECTOMY BILATERAL COMMON FEMORAL ARTERIES;  Surgeon: Magda Debby SAILOR, MD;  Location: [facility] OR;  Service: Vascular;  Laterality: Bilateral;   FEMORAL-POPLITEAL BYPASS GRAFT Left 09/24/2020   Procedure: LEFT FEMORAL ARTERY TO BELOW KNEE POPLITEAL ARTERY BY PASS GRAFT USING NON REVERSED SAPPENOUS VEIN.;  Surgeon: Magda Debby SAILOR, MD;  Location: [facility] OR;  Service: Vascular;  Laterality: Left;   INTRAOPERATIVE ARTERIOGRAM Bilateral 09/24/2020   Procedure: INTRA OPERATIVE ARTERIOGRAM RIGHT AND LEFT LEG;  Surgeon: Magda Debby SAILOR, MD;  Location: [facility] OR;  Service:  Vascular;  Laterality: Bilateral;   IR ANGIOGRAM EXTREMITY BILATERAL  08/31/2020   IR RADIOLOGIST EVAL & MGMT  08/14/2020   IR US  GUIDE VASC ACCESS LEFT  08/31/2020   IR US  GUIDE VASC ACCESS RIGHT  08/31/2020   VEIN HARVEST Left 09/24/2020   Procedure: VEIN HARVEST LEFT GREATER SAPPHENOUS;  Surgeon: Magda Debby SAILOR, MD;  Location: [facility] OR;  Service: Vascular;  Laterality: Left;   WOUND DEBRIDEMENT Left 11/01/2020   Procedure: DEBRIDEMENT LEFT FOOT WOUND;  Surgeon: Magda Debby SAILOR, MD;  Location: [facility] OR;  Service: Vascular;  Laterality: Left;   HPI:  Pt is a 79 y/o male presenting on 08/03/24 with slurred speech, R facial droop, and difficulty swallowing since 1/19. Found with small volume subdural hematoma along the falx, also with L basal ganglia infarct. Repeat CT stable. 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    Assessment / Plan / Recommendation  Clinical Impression  Pt presents with signs concerning for an acute, neurogenic dysphagia for which he would benefit from an MBS to better evaluate oropharyngeal function. Pending completion would leave NPO except for ice chips after oral care with staff or meds crushed in puree.   Pt has evidence of R facial and lingual asymmetry (CN VII, XII) with reduced labial seal and clearance of his buccal cavity. R anterior loss and pocketing are observed, but pt does consistently show awareness and makes attempt to manage. He did benefit from initial verbal cue for lingual sweep. Pt also has coughing that is elicited with thin liquids, concerning for  reduced airway protection, and says that this has been happening any time he has tried to drink water over the last few days since initial onset of symptoms.    SLP Visit Diagnosis: Dysphagia, unspecified (R13.10)      Swallow Evaluation Recommendations Recommendations: NPO except meds;Ice chips PRN after oral care Medication Administration: Crushed with  puree Oral care recommendations: Oral care QID (4x/day);Oral care before ice chips/water   Assistance Recommended at Discharge    Functional Status Assessment Patient has had a recent decline in their functional status and demonstrates the ability to make significant improvements in function in a reasonable and predictable amount of time.  Frequency and Duration            Prognosis Prognosis for improved oropharyngeal function: Good Barriers to Reach Goals: Cognitive deficits      Swallow Study   General HPI: Pt is a 79 y/o male presenting on 08/03/24 with slurred speech, R facial droop, and difficulty swallowing since 1/19. Found with small volume subdural hematoma along the falx, also with L basal ganglia infarct. Repeat CT stable. 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 Type of Study: Bedside Swallow Evaluation Previous Swallow Assessment: none in chart Diet Prior to this Study: NPO Temperature Spikes Noted: No Respiratory Status: Room air History of Recent Intubation: No Behavior/Cognition: Alert;Cooperative;Pleasant mood;Impulsive;Requires cueing Oral Cavity Assessment: Within Functional Limits Oral Care Completed by SLP: No Oral Cavity - Dentition: Missing dentition (no upper dentition) Vision: Functional for self-feeding Self-Feeding Abilities: Needs assist Patient Positioning: Upright in bed Baseline Vocal Quality: Normal Volitional Cough: Weak Volitional Swallow: Able to elicit    Oral/Motor/Sensory Function Overall Oral Motor/Sensory Function: Moderate impairment Facial ROM: Reduced right;Suspected CN VII (facial) dysfunction Facial Symmetry: Abnormal symmetry right;Suspected CN VII (facial) dysfunction Facial Sensation: Within Functional Limits Lingual ROM: Suspected CN XII (hypoglossal) dysfunction;Reduced left Lingual Symmetry: Abnormal symmetry right;Suspected CN XII (hypoglossal) dysfunction (deviates  to the pt's R) Lingual Strength: Reduced;Suspected CN XII (hypoglossal) dysfunction Velum: Within Functional Limits Mandible: Within Functional Limits   Ice Chips Ice chips: Impaired Presentation: Spoon Oral Phase Impairments: Reduced labial seal Oral Phase Functional Implications: Right anterior spillage   Thin Liquid Thin Liquid: Impaired Presentation: Cup;Self Fed;Straw Oral Phase Impairments: Reduced labial seal Oral Phase Functional Implications: Right anterior spillage Pharyngeal  Phase Impairments: Cough - Immediate;Cough - Delayed    Nectar Thick Nectar Thick Liquid: Not tested   Honey Thick Honey Thick Liquid: Not tested   Puree Puree: Impaired Presentation: Self Fed;Spoon Oral Phase Impairments: Reduced labial seal Oral Phase Functional Implications: Right anterior spillage;Right lateral sulci pocketing   Solid     Solid: Not tested      Leita SAILOR., M.A. CCC-SLP Acute Rehabilitation Services Office: [phone]  Secure chat preferred  08/04/2024,12:17 PM

## 2024-08-04 NOTE — Evaluation (Signed)
 Modified Barium Swallow Study  Patient Details  Name: Mark Dickerson MRN: [id number] Date of Birth: April 18, 1946  Today's Date: 08/04/2024  Modified Barium Swallow completed.  Full report located under Chart Review in the Imaging Section.  History of Present Illness Pt is a 79 y/o male presenting on 08/03/24 with slurred speech, R facial droop, and difficulty swallowing since 1/19. Found with small volume subdural hematoma along the falx, also with L basal ganglia infarct. Repeat CT stable. 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   Clinical Impression  Pt has an oral and pharyngeal dysphagia s/p acute stroke. Pt would benefit from Dys 1/Moderately thick liquids as pt displayed overt aspiration of thinner liquids and decreased oral swallow efficiency. SLP f/u as able to determine if PO texture upgrade is appropriate.    Pt displayed oral impairments across all domains; suspect that deficits in tongue control, lingual motion, and amount/location of oral residue are related to motor impairments of CN VII and XII. Use of straw increased oral efficiency, however decreased airway protection leading to trace aspiration of honey-thick liquid. Pt displayed moderate aspiration of thin and mildly-thick liquids as a result of reduced oral control as well as inadequate pharyngeal coordination and incomplete laryngeal vestibule closure. Pt sensed aspiration events, however was unable to clear material due to weak cough. Airway protection improved with honey thick liquids by cup/spoon when pt had more time for coordination. Pt displayed moderate pharyngeal residue with honey-thick liquids from reduced pharyngeal contraction and particularly curved epiglottis, but this cleared with a swallow of puree. Whole pill administered with puree, however pt was unable to clear pill from oral cavity.   Factors that may increase risk of adverse event in presence of  aspiration Noe & Lianne 2021): Reduced cognitive function;Weak cough;Inadequate oral hygiene;Limited mobility;Aspiration of thick, dense, and/or acidic materials  Swallow Evaluation Recommendations Recommendations: PO diet PO Diet Recommendation: Dysphagia 1 (Pureed);Moderately thick liquids (Level 3, honey thick) Liquid Administration via: Cup;Spoon;No straw Medication Administration: Crushed with puree Supervision: Staff to assist with self-feeding;Full supervision/cueing for swallowing strategies Swallowing strategies  : Slow rate;Small bites/sips;Check for pocketing or oral holding;Follow solids with liquids;Minimize environmental distractions;Check for anterior loss Postural changes: Position pt fully upright for meals;Stay upright 30-60 min after meals Oral care recommendations: Oral care BID (2x/day) Caregiver Recommendations: Avoid jello, ice cream, thin soups, popsicles;Remove water pitcher;Have oral suction available     Rocky Quan, Student SLP  08/04/2024,3:18 PM

## 2024-08-04 NOTE — Progress Notes (Addendum)
 "  HD#1 SUBJECTIVE:  Patient Summary: Mark Dickerson is a 79 y.o. with a pertinent PMH of HTN, HLD, T2DM, PAD, COPD, and right lung mass previously seen on imaging who presented with dysphagia, dysarthria, and right sided facial droop and admitted for left basal ganglia CVA.   Overnight Events: No acute events overnight.  Interim History: On interview, patient reports feeling stable since yesterday. He continues to endorse right sided facial and neck weakness impacting his ability to talk, breathe, and swallow. He does not report worsening or improvement of his symptoms. He denies noticing any new sensory or motor deficits in his face, upper extremities, groin, or lower extremities. He endorses a sense of worry about the stroke impacting his ability to function in the future. He denies the development of any new symptoms including chest pain. He reports no other concerns at this time but would like some emotional support from the team to help process the events that have unfolded for his over the past few days.  OBJECTIVE:  Vital Signs: Vitals:   08/03/24 2300 08/04/24 0448 08/04/24 0727 08/04/24 1109  BP:  130/65 (!) 165/85 137/81  Pulse: 84 74 81 88  Resp: 17 18 15 19   Temp:  98.9 F (37.2 C) 97.7 F (36.5 C) 97.8 F (36.6 C)  TempSrc:  Oral Oral Oral  SpO2: 98% 98% 98% 98%  Weight:      Height:       Supplemental O2: Room Air SpO2: 98 %  Filed Weights   08/03/24 1012  Weight: 81.6 kg     Intake/Output Summary (Last 24 hours) at 08/04/2024 1357 Last data filed at 08/03/2024 2334 Gross per 24 hour  Intake 0 ml  Output --  Net 0 ml   Net IO Since Admission: 0 mL [08/04/24 1357]  Physical Exam: Physical Exam Vitals reviewed.  Constitutional:      General: He is not in acute distress.    Appearance: He is not ill-appearing or diaphoretic.  HENT:     Mouth/Throat:     Mouth: Mucous membranes are dry.  Eyes:     General: No visual field deficit or scleral icterus.     Extraocular Movements: Extraocular movements intact.     Conjunctiva/sclera: Conjunctivae normal.     Pupils: Pupils are equal, round, and reactive to light.  Cardiovascular:     Rate and Rhythm: Normal rate. Rhythm irregular.     Heart sounds: No murmur heard. Pulmonary:     Effort: Pulmonary effort is normal. No respiratory distress.  Musculoskeletal:        General: No deformity (No obvious upper extremity or lower extremity deformity).     Comments: Dupuytren contracture present on fourth and fifth digits of the left hand. Dupuytren contracture present on third and fifth digits of the right hand.     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     Findings: Bruising (present on patient's fingers bilaterally) present.     Comments: Onychomycosis of all toes bilaterally.  Neurological:     Mental Status: He is alert.     Cranial Nerves: Cranial nerve deficit (right sided facial deficit affecting cranial nerve 7 and 12), dysarthria and facial asymmetry present.     Sensory: Sensation is intact. No sensory deficit.     Motor: Weakness (5/5 strength in bilateral proximal and distal upper extremities, 3+/5 grip strength bilaterally; 5/5  strength in right proximal and distal lower extremity; 4/5 strength in left lower proximal and distal extremity) present.     Comments: Significant for right sided facial droop and right sided tongue deviation on cranial nerve examination.  Psychiatric:        Mood and Affect: Mood normal.     Patient Lines/Drains/Airways Status     Active Line/Drains/Airways     Name Placement date Placement time Site Days   Peripheral IV 08/03/24 20 G Anterior;Right Forearm 08/03/24  1025  Forearm  1            Pertinent labs and imaging:      Latest Ref Rng & Units 08/04/2024    8:22 AM 08/03/2024   10:33 AM 11/01/2020    7:03 AM  CBC  WBC 4.0 - 10.5 K/uL 8.6  9.1    Hemoglobin  13.0 - 17.0 g/dL 84.3  84.8  87.7   Hematocrit 39.0 - 52.0 % 44.8  43.9  36.0   Platelets 150 - 400 K/uL 173  198         Latest Ref Rng & Units 08/04/2024    8:22 AM 08/03/2024   10:33 AM 11/01/2020    7:03 AM  CMP  Glucose 70 - 99 mg/dL 885  882  884   BUN 8 - 23 mg/dL 16  12  23    Creatinine 0.61 - 1.24 mg/dL 8.97  8.98  8.79   Sodium 135 - 145 mmol/L 133  132  133   Potassium 3.5 - 5.1 mmol/L 4.2  4.4  4.1   Chloride 98 - 111 mmol/L 94  95  98   CO2 22 - 32 mmol/L 24  23    Calcium  8.9 - 10.3 mg/dL 89.7  89.6    Total Protein 6.5 - 8.1 g/dL  7.8    Total Bilirubin 0.0 - 1.2 mg/dL  1.0    Alkaline Phos 38 - 126 U/L  69    AST 15 - 41 U/L  31    ALT 0 - 44 U/L  7     - A1C 6.1 - Lipid Panel: LDL 109  ECHOCARDIOGRAM COMPLETE Result Date: 08/04/2024    ECHOCARDIOGRAM REPORT   Patient Name:   Mark Dickerson Date of Exam: 08/04/2024 Medical Rec #:  [id number]          Height:       72.0 in Accession #:    [id number]         Weight:       180.0 lb Date of Birth:  1946-06-30           BSA:          2.037 m Patient Age:    78 years           BP:           165/85 mmHg Patient Gender: M                  HR:           80 bpm. Exam Location:  Inpatient Procedure: 2D Echo, Cardiac Doppler and Color Doppler (Both Spectral and Color            Flow Doppler were utilized during procedure). Indications:    Stroke I63.9  History:        Patient has prior history of Echocardiogram examinations, most  recent 09/20/2020. Arrythmias:Atrial Fibrillation.  Sonographer:    Nathanel Devonshire Referring Phys: [id number] DAN FLOYD IMPRESSIONS  1. Left ventricular ejection fraction, by estimation, is 55 to 60%. The left ventricle has normal function. The left ventricle has no regional wall motion abnormalities. Left ventricular diastolic function could not be evaluated.  2. Right ventricular systolic function is normal. The right ventricular size is normal. There is mildly elevated pulmonary artery systolic  pressure. The estimated right ventricular systolic pressure is 36.6 mmHg.  3. The mitral valve is degenerative. No evidence of mitral valve regurgitation. No evidence of mitral stenosis.  4. The aortic valve is tricuspid. Aortic valve regurgitation is not visualized. Aortic valve sclerosis is present, with no evidence of aortic valve stenosis.  5. The inferior vena cava is normal in size with greater than 50% respiratory variability, suggesting right atrial pressure of 3 mmHg. Comparison(s): A prior study was performed on 09/20/2020. No significant change from prior study. Conclusion(s)/Recommendation(s): No intracardiac source of embolism detected on this transthoracic study. Consider a transesophageal echocardiogram to exclude cardiac source of embolism if clinically indicated. FINDINGS  Left Ventricle: Left ventricular ejection fraction, by estimation, is 55 to 60%. The left ventricle has normal function. The left ventricle has no regional wall motion abnormalities. The left ventricular internal cavity size was normal in size. There is  no left ventricular hypertrophy. Left ventricular diastolic function could not be evaluated due to atrial fibrillation. Left ventricular diastolic function could not be evaluated. Right Ventricle: The right ventricular size is normal. No increase in right ventricular wall thickness. Right ventricular systolic function is normal. There is mildly elevated pulmonary artery systolic pressure. The tricuspid regurgitant velocity is 2.90  m/s, and with an assumed right atrial pressure of 3 mmHg, the estimated right ventricular systolic pressure is 36.6 mmHg. Left Atrium: Left atrial size was normal in size. Right Atrium: Right atrial size was normal in size. Pericardium: There is no evidence of pericardial effusion. Mitral Valve: The mitral valve is degenerative in appearance. Mild mitral annular calcification. No evidence of mitral valve regurgitation. No evidence of mitral valve  stenosis. Tricuspid Valve: The tricuspid valve is grossly normal. Tricuspid valve regurgitation is mild . No evidence of tricuspid stenosis. Aortic Valve: The aortic valve is tricuspid. Aortic valve regurgitation is not visualized. Aortic valve sclerosis is present, with no evidence of aortic valve stenosis. Aortic valve mean gradient measures 4.0 mmHg. Aortic valve peak gradient measures 7.1  mmHg. Aortic valve area, by VTI measures 2.12 cm. Pulmonic Valve: The pulmonic valve was normal in structure. Pulmonic valve regurgitation is not visualized. No evidence of pulmonic stenosis. Aorta: The aortic root and ascending aorta are structurally normal, with no evidence of dilitation. Venous: The inferior vena cava is normal in size with greater than 50% respiratory variability, suggesting right atrial pressure of 3 mmHg. IAS/Shunts: The atrial septum is grossly normal.  LEFT VENTRICLE PLAX 2D LVIDd:         4.20 cm     Diastology LVIDs:         2.90 cm     LV e' medial:   8.49 cm/s LV PW:         1.00 cm     LV E/e' medial: 7.6 LV IVS:        0.90 cm LVOT diam:     1.90 cm LV SV:         45 LV SV Index:   22 LVOT Area:     2.84 cm  LV Volumes (MOD) LV vol d, MOD A2C: 44.3 ml LV vol d, MOD A4C: 57.4 ml LV vol s, MOD A2C: 19.9 ml LV vol s, MOD A4C: 23.1 ml LV SV MOD A2C:     24.4 ml LV SV MOD A4C:     57.4 ml LV SV MOD BP:      31.0 ml RIGHT VENTRICLE          IVC RV Basal diam:  2.60 cm  IVC diam: 1.80 cm TAPSE (M-mode): 1.6 cm LEFT ATRIUM             Index LA diam:        3.00 cm 1.47 cm/m LA Vol (A2C):   41.1 ml 20.17 ml/m LA Vol (A4C):   38.2 ml 18.75 ml/m LA Biplane Vol: 40.4 ml 19.83 ml/m  AORTIC VALVE                    PULMONIC VALVE AV Area (Vmax):    1.97 cm     PV Vmax:       1.41 m/s AV Area (Vmean):   1.75 cm     PV Peak grad:  8.0 mmHg AV Area (VTI):     2.12 cm AV Vmax:           133.00 cm/s AV Vmean:          92.600 cm/s AV VTI:            0.210 m AV Peak Grad:      7.1 mmHg AV Mean Grad:      4.0  mmHg LVOT Vmax:         92.60 cm/s LVOT Vmean:        57.000 cm/s LVOT VTI:          0.157 m LVOT/AV VTI ratio: 0.75  AORTA Ao Root diam: 2.80 cm Ao Asc diam:  2.80 cm MV E velocity: 64.50 cm/s  TRICUSPID VALVE                            TR Peak grad:   33.6 mmHg                            TR Vmax:        290.00 cm/s                             SHUNTS                            Systemic VTI:  0.16 m                            Systemic Diam: 1.90 cm Sunit Tolia Electronically signed by Madonna Large Signature Date/Time: 08/04/2024/12:36:48 PM    Final    CT HEAD POST STROKE FOLLOWUP/TIMED/STAT READ Result Date: 08/03/2024 EXAM: CT HEAD WITHOUT CONTRAST 08/03/2024 05:07:59 PM TECHNIQUE: CT of the head was performed without the administration of intravenous contrast. Automated exposure control, iterative reconstruction, and/or weight based adjustment of the mA/kV was utilized to reduce the radiation dose to as low as reasonably achievable. COMPARISON: 08/03/2024 CLINICAL HISTORY: Neuro deficit, acute, stroke suspected FINDINGS: BRAIN AND VENTRICLES: Stable 5 mm parafalcine subdural hemorrhage. No new or enlarging foci of  intracranial hemorrhage. 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 Chronic microvascular ischemic changes and mild parenchymal volume loss. No evidence of acute infarct. No hydrocephalus. No mass effect or midline shift. ORBITS: Bilateral lens replacement. SINUSES: No acute abnormality. SOFT TISSUES AND SKULL: No acute soft tissue abnormality. No skull fracture. IMPRESSION: 1. Stable 5 mm parafalcine subdural hemorrhage. 2. No new or enlarging intracranial hemorrhage. Electronically signed by: Donnice Mania MD 08/03/2024 05:15 PM EST RP Workstation: [id number]    ASSESSMENT/PLAN:  Assessment: Principal Problem:   Acute stroke of basal ganglia (HCC) Active Problems:   Hypertension   Hyperlipidemia   Type 2 diabetes mellitus with complication, without long-term current use of insulin  (HCC)    Subdural hematoma (HCC)   Atrial fibrillation (HCC)   Plan: #Acute stroke of left basal ganglia  Patient has stable symptoms of dysphagia, dysarthria, and right sided facial droop secondary to ischemic infarct of left basal ganglia. No development of new sensory deficits or motor weakness on exam. Patient is still in A-fib with appropriate rates and systolic BP < 160. A1c of 6.1 which is at goal and LDL of 109 is not optimized. TTE showed EF of 55-60% and no source of embolism. Given patient's risk factors of extensive smoking history, HTN, HLD, and T2DM, and location of the stroke, most likely underlying etiology is thrombosis.  Patient is working with PT/OT/SLP and is NPO given dysphagia and multiple failed bedside swallow studies. PT recommends CIR but awaiting admission eval. OT recs pending. SLP recommends MBS which is pending. Appreciate further Neurology recommendations. - Neurology consulted, appreciate recs  - Neuro checks q4h  - Switch patient from pravastatin to rosuvastatin  20 mg daily when tolerating PO intake  - Continue to hold antiplatelet therapy due to SDH  - SBP goal <160  - Continuous cardiac monitoring - MBS pending per SLP, appreciate recs - Total 2L IVF while NPO - Delirium precautions  #Subdural hematoma #Fall on antiplatelet therapy Patient has had a fall on antiplatelet therapy. No development of new symptoms of ICP or bleeding including headache, nausea, vomiting or changes in the CBC. Repeat CT scan of the head showed stable size of hematoma. Patient's antiplatelet therapy has been held per neurology recommendations. Subdural hematoma is not likely to be the cause of the patient's presenting symptoms per neurosurgery, but the result of trauma after his fall. - Neurosurgery consulted, appreciate recs - Neurology consulted, appreciate recs  - Recommendations as above - Continue to monitor  #New-onset atrial fibrillation without RVR  Patient is in A-fib without RVR.  No prior history of A-fib or anticoagulation.  CHA2DS2-VASc of 6. Currently asymptomatic, denying any chest pain. Rates have been within the normal range. Patient has been provided fluid boluses given poor PO intake prior to admission and NPO status. Will continue to monitor patient for changes in rate, treating with fluid first and IV metoprolol  if necessary for rate control. Holding home beta blocker pending further SLP eval. holding anticoagulation in the setting of subdural hematoma. - Continuous cardiac monitoring - Hold home metoprolol  succinate 50 mg daily  #History of Falls  According to patient's family, patient has had multiple falls over the past 9 months attributed to his weight loss and weakness secondary to suspected lung cancer. Patient's family denied indications of a mechanical fall including tripping, rugs, or stairs. Orthostatic vitals are pending. His history of falls could stem from medication side effects, but unlikely given patient has a history of uncontrolled hypertension with his medications. Will continue to  monitor his progress with PT/OT. - Orthostatic vitals pending - PT/OT eval + treat   #COPD #Tobacco Use Disorder #R Lower Lobe Lung Mass Patient has a 60+ year history of smoking 1ppd. Diagnosed with COPD and has been experiencing shortness of breath attributed to his COPD by the family. Patient also has a right infrahilar lung mass that was being worked up by [facility], but patient declined biopsy and treatment of suspected lung cancer. Currently not in respiratory distress, but has been having difficulty moving air through his trachea. Oxygenating well on room air currently. - Home albuterol  inhaler prn  #Type 2 diabetes mellitus  Patient has diabetes well controlled with metformin 500 mg daily. Most recent A1C in the hospital was 6.1. Will hold SSI while patient is NPO and consider starting the patient on SSI once cleared by SLP for PO intake. - Hold home  metformin 500 mg daily  #Hypertension  Patient has poorly controlled hypertension presumably managed with amlodipine , metoprolol , benazepril , and hydrochlorothiazide. This could certainly be contributing risk factor for his acute CVA. Patient is unsure of which medicines he is exactly taking for his HTN. Neurology recommends SBP <160 and patient's pressures have been lower than the recommendation. Will consider adding on amlodipine  if patient's pressures rise. - Hold home amlodipine , metoprolol , benazepril , and hydrochlorothiazide  #Hyperlipidemia #PAD #History of NSTEMI Patient has a history of hyperlipidemia controlled with pravastatin. Also has a history of significant PAD requiring post aortobifemoral bypass and left femoral to below-knee popliteal artery bypass in 2022. Patient experienced NSTEMI without catheterization per patient request. Has been on antiplatelet therapy after revascularization. Lipid panel in the hospital significant for LDL of 109. Given recent stroke, will need to switch patient to high-intensity statin. - Hold home antiplatelet therapy - Switch pravastatin to PO rosuvastatin  20 mg daily when patient is tolerating PO intake  Best Practice: Diet: NPO until SLP assessment IVF: Fluids: 0.9NS, Rate: 1000 cc bolus x 2 VTE: Place and maintain sequential compression device Start: 08/03/24 1848 Code: Full  Disposition planning: Therapy Recs: CIR, DME: none Family Contact: Brothers, sister-in-law, and nephew, at bedside. DISPO: Anticipated discharge in 2-3 days to Rehab pending ongoing work up and management of acute stroke.  Signature:  Elia LULLA [facility]  1:57 PM, 08/04/2024  On Call pager [phone]  Attestation for Student Documentation:  I personally was present and re-performed the history, physical exam and medical decision-making activities of this service and have verified that the service and findings are accurately  documented in the students note.  [name], MD 08/04/2024, 2:19 PM   "

## 2024-08-04 NOTE — Evaluation (Signed)
 Speech Language Pathology Evaluation Patient Details Name: Mark Dickerson MRN: [id number] DOB: 06/24/1946 Today's Date: 08/04/2024 Time: 8971-8956 SLP Time Calculation (min) (ACUTE ONLY): 15 min  Problem List:  Patient Active Problem List   Diagnosis Date Noted   Acute stroke of basal ganglia (HCC) 08/03/2024   Subdural hematoma (HCC) 08/03/2024   Atrial fibrillation (HCC) 08/03/2024   Hypertension 10/12/2020   Hyperlipidemia 10/12/2020   Type 2 diabetes mellitus with complication, without long-term current use of insulin  (HCC) 10/12/2020   Non-ST elevation (NSTEMI) myocardial infarction (HCC) 10/12/2020   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/19/2020   Past Medical History:  Past Medical History:  Diagnosis Date   Diabetes mellitus without complication (HCC)    Hyperlipidemia    Hypertension    Myocardial infarction (HCC)    Peripheral arterial disease    Tobacco abuse    Past Surgical History:  Past Surgical History:  Procedure Laterality Date   ABDOMINAL AORTOGRAM W/LOWER EXTREMITY N/A 09/19/2020   Procedure: ABDOMINAL AORTOGRAM W/LOWER EXTREMITY;  Surgeon: Magda Debby SAILOR, MD;  Location: [facility];  Service: Cardiovascular;  Laterality: N/A;   AMPUTATION Left 11/01/2020   Procedure: LEFT THIRD TOE AMPUTATION;  Surgeon: Magda Debby SAILOR, MD;  Location: [facility] OR;  Service: Vascular;  Laterality: Left;   AORTA - BILATERAL FEMORAL ARTERY BYPASS GRAFT N/A 09/24/2020   Procedure: AORTA BIFEMORAL BYPASS GRAFT;  Surgeon: Magda Debby SAILOR, MD;  Location: [facility];  Service: Vascular;  Laterality: N/A;   AORTIC ENDARTERECETOMY N/A 09/24/2020   Procedure: AORTIC ENDARTERECETOMY;  Surgeon: Magda Debby SAILOR, MD;  Location: [facility];  Service: Vascular;  Laterality: N/A;   ENDARTERECTOMY FEMORAL Bilateral 09/24/2020   Procedure: ENDARTERECTOMY BILATERAL COMMON FEMORAL ARTERIES;  Surgeon: Magda Debby SAILOR, MD;  Location: [facility];  Service: Vascular;   Laterality: Bilateral;   FEMORAL-POPLITEAL BYPASS GRAFT Left 09/24/2020   Procedure: LEFT FEMORAL ARTERY TO BELOW KNEE POPLITEAL ARTERY BY PASS GRAFT USING NON REVERSED SAPPENOUS VEIN.;  Surgeon: Magda Debby SAILOR, MD;  Location: [facility];  Service: Vascular;  Laterality: Left;   INTRAOPERATIVE ARTERIOGRAM Bilateral 09/24/2020   Procedure: INTRA OPERATIVE ARTERIOGRAM RIGHT AND LEFT LEG;  Surgeon: Magda Debby SAILOR, MD;  Location: [facility];  Service: Vascular;  Laterality: Bilateral;   IR ANGIOGRAM EXTREMITY BILATERAL  08/31/2020   IR RADIOLOGIST EVAL & MGMT  08/14/2020   IR US  GUIDE VASC ACCESS LEFT  08/31/2020   IR US  GUIDE VASC ACCESS RIGHT  08/31/2020   VEIN HARVEST Left 09/24/2020   Procedure: VEIN HARVEST LEFT GREATER SAPPHENOUS;  Surgeon: Magda Debby SAILOR, MD;  Location: [facility];  Service: Vascular;  Laterality: Left;   WOUND DEBRIDEMENT Left 11/01/2020   Procedure: DEBRIDEMENT LEFT FOOT WOUND;  Surgeon: Magda Debby SAILOR, MD;  Location: [facility] OR;  Service: Vascular;  Laterality: Left;   HPI:  Pt is a 79 y/o male presenting on 08/03/24 with slurred speech, R facial droop, and difficulty swallowing since 1/19. Found with small volume subdural hematoma along the falx, also with L basal ganglia infarct. Repeat CT stable. 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   Assessment / Plan / Recommendation Clinical Impression  Pt presents with acute changes to his speech and presumably his cognition, although family is not here to confirm. Recommend intensive SLP f/u at next level of care.   Pt presents with signs of cranial nerve impairment (CN VII,  CN XII) and subsequent dysarthria with reduced precision for articulation. Intelligibility is reduced at the phrase level. Cognitively he shows impulsivity and reduced safety awareness as well as disorientation to situation and reduced recall of recent events. He seems to provide conflicting history to what is described  in his chart about PLOF. He has notable impairments in sustained attention that also impacts immediate recall/storage of information. He does follow most one-step commands when given minimal cueing though.     SLP Assessment  SLP Recommendation/Assessment: Patient needs continued Speech Language Pathology Services SLP Visit Diagnosis: Dysarthria and anarthria (R47.1);Cognitive communication deficit (R41.841)     Assistance Recommended at Discharge  Frequent or constant Supervision/Assistance  Functional Status Assessment Patient has had a recent decline in their functional status and demonstrates the ability to make significant improvements in function in a reasonable and predictable amount of time.  Frequency and Duration min 2x/week  2 weeks      SLP Evaluation Cognition  Overall Cognitive Status: No family/caregiver present to determine baseline cognitive functioning Arousal/Alertness: Awake/alert Orientation Level: Oriented to person;Oriented to place;Oriented to time;Disoriented to situation Attention: Sustained Sustained Attention: Impaired Sustained Attention Impairment: Verbal basic Memory: Impaired Memory Impairment: Decreased recall of new information Behaviors: Impulsive Safety/Judgment: Impaired       Comprehension  Auditory Comprehension Overall Auditory Comprehension: Impaired Commands: Impaired One Step Basic Commands: 75-100% accurate Interfering Components: Attention    Expression Expression Primary Mode of Expression: Verbal Verbal Expression Overall Verbal Expression: Appears within functional limits for tasks assessed   Oral / Motor  Oral Motor/Sensory Function Overall Oral Motor/Sensory Function: Moderate impairment Facial ROM: Reduced right;Suspected CN VII (facial) dysfunction Facial Symmetry: Abnormal symmetry right;Suspected CN VII (facial) dysfunction Facial Sensation: Within Functional Limits Lingual ROM: Suspected CN XII (hypoglossal)  dysfunction;Reduced left Lingual Symmetry: Abnormal symmetry right;Suspected CN XII (hypoglossal) dysfunction Lingual Strength: Reduced;Suspected CN XII (hypoglossal) dysfunction Velum: Within Functional Limits Mandible: Within Functional Limits Motor Speech Overall Motor Speech: Impaired Respiration: Within functional limits Phonation: Normal Resonance: Within functional limits Articulation: Impaired Level of Impairment: Phrase Intelligibility: Intelligibility reduced Word: 75-100% accurate Phrase: 75-100% accurate Motor Planning: Within functional limits Motor Speech Errors: Not applicable            Leita SAILOR., M.A. CCC-SLP Acute Rehabilitation Services Office: [phone]  Secure chat preferred  08/04/2024, 12:42 PM

## 2024-08-04 NOTE — Progress Notes (Signed)
  Inpatient Rehab Admissions Coordinator :  Per therapy recommendations, patient was screened for CIR candidacy by Ottie Glazier RN MSN.  At this time patient appears to be a potential candidate for CIR. I will place a rehab consult per protocol for full assessment. Please call me with any questions.  Ottie Glazier RN MSN Admissions Coordinator [phone]

## 2024-08-04 NOTE — Progress Notes (Signed)
 PT Cancellation Note  Patient Details Name: Mark Dickerson MRN: [id number] DOB: 02-03-1946   Cancelled Treatment:    Reason Eval/Treat Not Completed: Patient at procedure or test/unavailable (Pt now undergoing Echo. Will try again later.)   Aamina Skiff 08/04/2024, 9:28 AM

## 2024-08-04 NOTE — Evaluation (Signed)
 Physical Therapy Evaluation Patient Details Name: Mark Dickerson MRN: [id number] DOB: 04-Oct-1945 Today's Date: 08/04/2024  History of Present Illness  Pt is 79 y/o male presenting on 08/03/24 with slurred speech, R facial droop since 1/19. Found with small volume subdural hematoma along the falx, also with L basal ganglia infarct. Repeat CT stable. PMH includes: DM, HTN, MI, PAD.  Clinical Impression  Pt presents with admitting diagnosis above. Finally able to see patient on 4th attempt. Pt today was able to stand and step pivot to recliner with RW Min A. Pt today limited by hardwire telemetry and would benefit from a portable tele box to progress ambulation. PTA pt reports that he lives alone with family checking in daily and that he was able to ambulate independently with no AD. Patient will benefit from intensive inpatient follow-up therapy, >3 hours/day PT will continue to follow.         If plan is discharge home, recommend the following: A little help with walking and/or transfers;A little help with bathing/dressing/bathroom;Assistance with cooking/housework;Direct supervision/assist for medications management;Direct supervision/assist for financial management;Assist for transportation;Help with stairs or ramp for entrance;Supervision due to cognitive status   Can travel by private vehicle        Equipment Recommendations None recommended by PT  Recommendations for Other Services  Rehab consult    Functional Status Assessment Patient has had a recent decline in their functional status and demonstrates the ability to make significant improvements in function in a reasonable and predictable amount of time.     Precautions / Restrictions Precautions Precautions: Fall;Other (comment) Recall of Precautions/Restrictions: Intact Precaution/Restrictions Comments: Aphasia Restrictions Weight Bearing Restrictions Per Provider Order: No      Mobility  Bed Mobility Overal bed  mobility: Needs Assistance Bed Mobility: Supine to Sit     Supine to sit: Min assist     General bed mobility comments: Min A HHA for trunk elevation.    Transfers Overall transfer level: Needs assistance Equipment used: Rolling walker (2 wheels) Transfers: Sit to/from Stand, Bed to chair/wheelchair/BSC Sit to Stand: Min assist, From elevated surface   Step pivot transfers: Min assist       General transfer comment: Min A to stand and step pivot to recliner. Cues for hand placement on RW however pt still opting for pulling up on RW.    Ambulation/Gait               General Gait Details: Limited by hardwire tele. Will need to portable tele to advance gait training  Stairs            Wheelchair Mobility     Tilt Bed    Modified Rankin (Stroke Patients Only) Modified Rankin (Stroke Patients Only) Pre-Morbid Rankin Score: No symptoms Modified Rankin: Slight disability     Balance Overall balance assessment: Needs assistance Sitting-balance support: Bilateral upper extremity supported, Feet supported Sitting balance-Leahy Scale: Fair     Standing balance support: Bilateral upper extremity supported, During functional activity, Reliant on assistive device for balance Standing balance-Leahy Scale: Poor                               Pertinent Vitals/Pain Pain Assessment Pain Assessment: No/denies pain    Home Living Family/patient expects to be discharged to:: Private residence Living Arrangements: Alone Available Help at Discharge: Family;Available PRN/intermittently Type of Home: House Home Access: Stairs to enter Entrance Stairs-Rails: Left Entrance Stairs-Number of Steps: 2  Home Layout: One level Home Equipment: Rolling Walker (2 wheels);Grab bars - tub/shower;Cane - single point;Hand held shower head      Prior Function Prior Level of Function : Independent/Modified Independent;Driving;Patient poor historian/Family not  available;History of Falls (last six months) (Pt reports no falls however per chart review has had some falls.)             Mobility Comments: Pt reports that he mostly walked on his own however sometimes used the walker. ADLs Comments: Pt reports ind     Extremity/Trunk Assessment   Upper Extremity Assessment Upper Extremity Assessment: RUE deficits/detail RUE Deficits / Details: Decreased coordination with finger to nose testing. RUE Coordination: decreased gross motor    Lower Extremity Assessment Lower Extremity Assessment: RLE deficits/detail RLE Deficits / Details: decreased coordination with heel to shin RLE Coordination: decreased gross motor    Cervical / Trunk Assessment Cervical / Trunk Assessment: Kyphotic  Communication   Communication Communication: No apparent difficulties    Cognition Arousal: Alert Behavior During Therapy: Flat affect   PT - Cognitive impairments: No family/caregiver present to determine baseline                       PT - Cognition Comments: Followed commands appropriately. Somewhat of a poor historian as subjective didnt match up to chart review fully however no family present. Following commands: Intact       Cueing Cueing Techniques: Verbal cues, Tactile cues     General Comments General comments (skin integrity, edema, etc.): HR up to 127 with mobility with pt noted to be in AFIB.    Exercises     Assessment/Plan    PT Assessment Patient needs continued PT services  PT Problem List Decreased strength;Decreased range of motion;Decreased activity tolerance;Decreased balance;Decreased mobility;Decreased coordination;Decreased cognition;Decreased knowledge of use of DME;Decreased safety awareness;Decreased knowledge of precautions;Cardiopulmonary status limiting activity       PT Treatment Interventions DME instruction;Gait training;Stair training;Functional mobility training;Therapeutic activities;Therapeutic  exercise;Balance training;Neuromuscular re-education;Cognitive remediation;Patient/family education    PT Goals (Current goals can be found in the Care Plan section)  Acute Rehab PT Goals Patient Stated Goal: to get better PT Goal Formulation: With patient Time For Goal Achievement: 08/18/24 Potential to Achieve Goals: Good    Frequency Min 2X/week     Co-evaluation               AM-PAC PT 6 Clicks Mobility  Outcome Measure Help needed turning from your back to your side while in a flat bed without using bedrails?: A Little Help needed moving from lying on your back to sitting on the side of a flat bed without using bedrails?: A Little Help needed moving to and from a bed to a chair (including a wheelchair)?: A Little Help needed standing up from a chair using your arms (e.g., wheelchair or bedside chair)?: A Little Help needed to walk in hospital room?: A Lot Help needed climbing 3-5 steps with a railing? : Total 6 Click Score: 15    End of Session Equipment Utilized During Treatment: Gait belt Activity Tolerance: Patient tolerated treatment well Patient left: in chair;with call bell/phone within reach;with chair alarm set Nurse Communication: Mobility status PT Visit Diagnosis: Other abnormalities of gait and mobility (R26.89)    Time: 8950-8890 PT Time Calculation (min) (ACUTE ONLY): 20 min   Charges:   PT Evaluation $PT Eval Moderate Complexity: 1 Mod   PT General Charges $$ ACUTE PT VISIT: 1 Visit  Lidia Clavijo B, PT, DPT Acute Rehab Services [phone]   Bonny Egger 08/04/2024, 11:33 AM

## 2024-08-04 NOTE — Progress Notes (Signed)
 STROKE TEAM PROGRESS NOTE   SIGNIFICANT HOSPITAL EVENTS 1/21:  - Presented to the ED with 2 day history of slurred speech and right facial droop - CTH with evidence of acute SDH along the high frontal parietal falx cerebri  - CTA without acute LVO but with severe stenosis of the distal right common carotid artery just prior to the bifurcation and of the origin of the right cervical ICA, with approximately 90% narrowing along with approximately 60% stenosis at the origin of the left cervical ICA and moderate stenosis at the origins of the bilateral vertebral arteries and posterior vertical portion of the left cavernous ICA - MRI brain with small acute infarct in the left basal ganglia - Repeat CTH with stable SDH  INTERIM HISTORY/SUBJECTIVE No family is present at bedside today.  Patient is sitting up in bed in no acute distress.  Patient states my voice changed and states that he was hoping that the symptoms would improve at home.  He does state that he lives by himself.  OBJECTIVE CBC    Component Value Date/Time   WBC 9.1 08/03/2024 1033   RBC 4.78 08/03/2024 1033   HGB 15.1 08/03/2024 1033   HCT 43.9 08/03/2024 1033   PLT 198 08/03/2024 1033   MCV 91.8 08/03/2024 1033   MCH 31.6 08/03/2024 1033   MCHC 34.4 08/03/2024 1033   RDW 15.2 08/03/2024 1033   LYMPHSABS 1.8 08/03/2024 1033   MONOABS 0.7 08/03/2024 1033   EOSABS 0.7 (H) 08/03/2024 1033   BASOSABS 0.1 08/03/2024 1033   BMET    Component Value Date/Time   NA 132 (L) 08/03/2024 1033   NA 134 10/12/2020 1124   K 4.4 08/03/2024 1033   CL 95 (L) 08/03/2024 1033   CO2 23 08/03/2024 1033   GLUCOSE 117 (H) 08/03/2024 1033   BUN 12 08/03/2024 1033   BUN 15 10/12/2020 1124   CREATININE 1.01 08/03/2024 1033   CALCIUM  10.3 08/03/2024 1033   EGFR 60 10/12/2020 1124   GFRNONAA >60 08/03/2024 1033   Lab Results  Component Value Date   HGBA1C 6.1 (H) 08/04/2024   Lab Results  Component Value Date   CHOL 181 08/04/2024    HDL 46 08/04/2024   LDLCALC 109 (H) 08/04/2024   TRIG 127 08/04/2024   CHOLHDL 3.9 08/04/2024   Drugs of Abuse     Component Value Date/Time   LABOPIA NEGATIVE 08/03/2024 1618   COCAINSCRNUR NEGATIVE 08/03/2024 1618   LABBENZ NEGATIVE 08/03/2024 1618   AMPHETMU NEGATIVE 08/03/2024 1618   THCU NEGATIVE 08/03/2024 1618   LABBARB NEGATIVE 08/03/2024 1618    IMAGING past 24 hours CT HEAD POST STROKE FOLLOWUP/TIMED/STAT READ Result Date: 08/03/2024 EXAM: CT HEAD WITHOUT CONTRAST 08/03/2024 05:07:59 PM TECHNIQUE: CT of the head was performed without the administration of intravenous contrast. Automated exposure control, iterative reconstruction, and/or weight based adjustment of the mA/kV was utilized to reduce the radiation dose to as low as reasonably achievable. COMPARISON: 08/03/2024 CLINICAL HISTORY: Neuro deficit, acute, stroke suspected FINDINGS: BRAIN AND VENTRICLES: Stable 5 mm parafalcine subdural hemorrhage. No new or enlarging foci of intracranial hemorrhage. 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 Chronic microvascular ischemic changes and mild parenchymal volume loss. No evidence of acute infarct. No hydrocephalus. No mass effect or midline shift. ORBITS: Bilateral lens replacement. SINUSES: No acute abnormality. SOFT TISSUES AND SKULL: No acute soft tissue abnormality. No skull fracture. IMPRESSION: 1. Stable 5 mm parafalcine subdural hemorrhage. 2. No new or enlarging intracranial hemorrhage. Electronically signed by:  Donnice Mania MD 08/03/2024 05:15 PM EST RP Workstation: [id number]   MR BRAIN WO CONTRAST Result Date: 08/03/2024 EXAM: MRI Brain Without Contrast 08/03/2024 01:38:21 PM TECHNIQUE: Multiplanar multisequence MRI of the head/brain was performed without the administration of intravenous contrast. COMPARISON: None available. CLINICAL HISTORY: Neuro deficit, acute, stroke suspected FINDINGS: BRAIN AND VENTRICLES: Small acute infarct in the left basal ganglia. No  intracranial hemorrhage. No mass. No midline shift. No hydrocephalus. The sella is unremarkable. Normal flow voids. Moderate patchy T2 hyperintensities in the white matter, compatible with chronic microvascular ischemic disease. ORBITS: No significant abnormality. SINUSES AND MASTOIDS: No significant abnormality. BONES AND SOFT TISSUES: Normal marrow signal. No soft tissue abnormality. IMPRESSION: 1. Small acute infarct in the left basal ganglia. 2. Moderate chronic microvascular ischemic change. Electronically signed by: Glendia Molt MD 08/03/2024 02:46 PM EST RP Workstation: [id number]   CT ANGIO HEAD NECK W WO CM Result Date: 08/03/2024 EXAM: CTA HEAD AND NECK WITH AND WITHOUT 08/03/2024 11:49:09 AM TECHNIQUE: CTA of the head and neck was performed with and without the administration of 75 mL of intravenous contrast (iohexol  (OMNIPAQUE ) 350 MG/ML injection). Multiplanar 2D and/or 3D reformatted images are provided for review. Automated exposure control, iterative reconstruction, and/or weight based adjustment of the mA/kV was utilized to reduce the radiation dose to as low as reasonably achievable. Stenosis of the internal carotid arteries measured using NASCET criteria. COMPARISON: CT head dated 08/03/2024 11:49:09 AM CLINICAL HISTORY: Stroke/TIA, determine embolic source. FINDINGS: CTA NECK: AORTIC ARCH AND ARCH VESSELS: Mild atherosclerosis of the aortic arch. Atherosclerosis involving the left subclavian artery origin resulting in mild stenosis. Additional atherosclerosis along the brachiocephalic artery and proximal right subclavian artery resulting in mild to moderate stenosis of the proximal right subclavian artery. No dissection or arterial injury. CERVICAL CAROTID ARTERIES: Right carotid bifurcation: Prominent calcified atherosclerosis. Severe stenosis at the distal aspect of the right common carotid artery just prior to the bifurcation. Severe stenosis at the origin of the right cervical ICA with  approximately 90% narrowing of the vessel. Additional atherosclerosis along the right carotid bulb which results in approximately 75% stenosis. Left carotid bifurcation: Prominent calcified atherosclerosis. Approximately 60% stenosis at the origin of the left cervical ICA with additional atherosclerosis along the left carotid bulb resulting in similar stenosis. No dissection or arterial injury. CERVICAL VERTEBRAL ARTERIES: The right vertebral artery is dominant. Bulky calcified atherosclerosis at the origin of the right vertebral artery resulting in moderate stenosis. Atherosclerosis at the origin of the nondominant left vertebral artery also resulting in moderate stenosis. Atherosclerosis of the right V4 segment resulting in mild stenosis. No dissection or arterial injury. LUNGS AND MEDIASTINUM: Centrilobular emphysema in the lung apices. SOFT TISSUES: Edentulous maxilla. BONES: Degenerative changes throughout the visualized spine with disc space narrowing and prominent degenerative endplate osteophytes most pronounced at C4-C5. There is facet arthrosis at multiple levels throughout the cervical spine greatest on the left at C2-C3 and C3-C4. 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 There is moderate stenosis at the posterior vertical portion of the left cavernous ICA. Additional mild to moderate stenosis throughout the bilateral cavernous and supraclinoid ICAs. No significant stenosis of the anterior cerebral arteries. No significant stenosis of the middle cerebral arteries. No aneurysm. POSTERIOR CIRCULATION: No significant stenosis of the posterior cerebral arteries. No significant stenosis of the basilar artery. No significant stenosis of the intracranial vertebral arteries. No aneurysm. OTHER: Redemonstrated subdural hematoma along the anterior falx. No dural venous sinus thrombosis  on this non-dedicated study. IMPRESSION: 1. No acute  large vessel occlusion. 2. 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 3. Approximately 60% stenosis at the origin of the left cervical ICA. 4. Moderate stenosis at the origins of the bilateral vertebral arteries. 5. Moderate stenosis at the posterior vertical portion of the left cavernous ICA, with additional mild to moderate stenosis throughout the bilateral cavernous and supraclinoid ICAs. 6. Redemonstrated subdural hematoma along the anterior falx. 7. Pulmonary emphysema is an independent risk factor for lung cancer. Recommend consideration for evaluation for a low-dose CT lung cancer screening program. Electronically signed by: Donnice Mania MD 08/03/2024 12:16 PM EST RP Workstation: [id number]   DG Chest Port 1 View Result Date: 08/03/2024 CLINICAL DATA:  Hypoxia. EXAM: PORTABLE CHEST 1 VIEW COMPARISON:  03/31/2024 FINDINGS: Lungs are somewhat hypoinflated. Increased size of masslike opacity over the right infrahilar region measuring approximately 5.4 x 7.2 cm. Subcentimeter rounded nodular opacity over the left lung base which may represent the left nipple. No effusion. Cardiomediastinal silhouette and remainder of the exam is unchanged. IMPRESSION: 1. Increased size of masslike opacity over the right infrahilar region measuring approximately 5.4 x 7.2 cm. Recommend further evaluation with chest CT with contrast. 2. Subcentimeter rounded nodular opacity over the left lung base which may represent the left nipple. This can be evaluated on the chest CT. Electronically Signed   By: Toribio Agreste M.D.   On: 08/03/2024 11:18   CT HEAD WO CONTRAST Result Date: 08/03/2024 CLINICAL DATA:  Slurred speech and facial droop with urinary incontinence a few days. Symptoms began Monday morning. EXAM: CT HEAD WITHOUT CONTRAST TECHNIQUE: Contiguous axial images were obtained from the base of the  skull through the vertex without intravenous contrast. RADIATION DOSE REDUCTION: This exam was performed according to the departmental dose-optimization program which includes automated exposure control, adjustment of the mA and/or kV according to patient size and/or use of iterative reconstruction technique. COMPARISON:  03/31/2024 FINDINGS: Brain: Examination demonstrates acute subdural hemorrhage along the high frontal parietal falx cerebri. This measures 5 mm in thickness. There is no significant mass effect or midline shift. Ventricles and cisterns are otherwise normal. There is chronic ischemic microvascular disease present. Old lacunar infarct over the right basal ganglia. Small old lacunar infarct over the left caudate nucleus. Mild prominence of the cisterna magna. Vascular: No hyperdense vessel or unexpected calcification. Skull: Normal. Negative for fracture or focal lesion. Sinuses/Orbits: No acute finding. Other: None. IMPRESSION: 1. Acute subdural hematoma along the high frontal parietal falx cerebri measuring 5 mm in thickness. No significant mass effect or midline shift. 2. 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 Critical Value/emergent results were called by telephone at the time of interpretation on 08/03/2024 at 11:13 am to provider DAN FLOYD , who verbally acknowledged these results. Electronically Signed   By: Toribio Agreste M.D.   On: 08/03/2024 11:13   Vitals:   08/03/24 2245 08/03/24 2300 08/04/24 0448 08/04/24 0727  BP: (!) 152/105  130/65 (!) 165/85  Pulse: 78 84 74 81  Resp: 20 17 18 15   Temp: 97.8 F (36.6 C)  98.9 F (37.2 C) 97.7 F (36.5 C)  TempSrc:   Oral Oral  SpO2: 100% 98% 98% 98%  Weight:      Height:       PHYSICAL EXAM General:  Alert, well-nourished, well-developed patient in no  acute distress Psych:  Mood and affect appropriate for situation, he was calm and cooperative with exam CV: Irregular rate and rhythm on monitor,  atrial fibrillation Respiratory:  Regular, unlabored respirations on room air GI: Abdomen soft and nontender  NEURO:  Mental Status: AA&Ox3, patient is able to give clear and coherent history Speech/Language: speech is mildly dysarthric without aphasia.  Naming mostly intact, unable to name ID badge but able to correctly name all 7 other examiner objects correctly. Repetition, fluency, and comprehension intact.  Cranial Nerves:  II: PERRL. Visual fields full.  III, IV, VI: EOMI. Eyelids elevate symmetrically.  V: Sensation is intact to light touch and symmetrical to face.  VII: Right facial weakness is noted VIII: Hearing intact to voice. IX, X: Palate elevates symmetrically. Phonation is normal.  XI: Shoulder shrug 5/5. XII: Tongue is midline without fasciculations. Motor: Bilateral upper extremities elevate antigravity without vertical drift.  4/5 strength bilateral upper extremities.  Bilateral lower extremities 4/5 without vertical drift or asymmetry. Tone: is normal and bulk is normal Sensation: Intact to light touch bilaterally. Extinction absent to light touch to DSS.   Coordination: FTN intact bilaterally Gait: Deferred  ASSESSMENT/PLAN Mark Dickerson is a 79 y.o. male with history of DM2, HTN, HLD, MI, PVD, tobacco use presenting with slurred speech, right facial droop since 08/01/24.   Stroke: Small acute infarct in the left basal ganglia, etiology likely small vessel disease based on location CT Head: Old left caudate and right BG infarcts CTA head & neck No acute LVO. Severe stenosis of the distal right CCA just prior to the bifurcation and of the origin of the right cervical ICA, with approximately 90% narrowing.  Approximately 60% stenosis at the origin of the left cervical ICA. Moderate stenosis at the origins of the bilateral PAs. Moderate stenosis at the posterior vertical portion of the left cavernous ICA, with additional mild to moderate stenosis throughout the  bilateral cavernous and supraclinoid ICAs.  MRI  small acute infarct in the left basal ganglia. Moderate chronic microvascular ischemic change 2D Echo LVEF 55 to 60% LDL 109 HgbA1c 6.1 UDS negative VTE prophylaxis -okay to start heparin  subcu today clopidogrel  75 mg daily prior to admission, now on No antithrombotic given acute SDH.  Recommend to repeat CT 2-3 weeks (likely still in CIR), if SDH absorbed or nearly absorbed at that time can consider to start antithrombotics for stroke prevention  Therapy recommendations:  CIR Disposition:  pending   SDH CT Head: acute SDH along the high frontal parietal falx cerebri measuring 5 mm in thickness.  Repeat CTH- 6 hour stability scan- Stable 5 mm parafalcine SDH with no new or enlarging intracranial hemorrhage  Patient denies recent trauma or fall Holding off antithrombotics at this time Okay to start heparin  subcu for DVT prophylaxis  New diagnosed Atrial Fibrillation Atrial fibrillation noted on cardiac monitor and EKG on admission EKG also captured A-fib Current stroke like likely related to A-fib given location and number Recommend to repeat CT 2-3 weeks (likely still in CIR), if SDH absorbed or nearly absorbed at that time can consider to start antithrombotics for stroke prevention   Hypertension Home meds:  amlodipine , benazepril ,, hydrochlorothiazide. metoprolol  Stable Blood Pressure Goal: SBP less than 160  Long-term BP goal normotensive  Hyperlipidemia Home meds:  pravastatin 20 mg, fenofibrate LDL 109, goal < 70 Change pravastatin to rosuvastatin  20 mg Continue statin at discharge  Diabetes type II Controlled Home meds:  Metformin HgbA1c 6.1, goal < 7.0  CBGs SSI as needed  Tobacco Abuse Patient smokes 1 pack per day for at least 30 years      Ready to quit? N/A Nicotine  replacement therapy as needed  Dysphagia Patient has post-stroke dysphagia, SLP consulted  now dysphagia 1 and honey thick liquid  Advance diet  as able  Other Stroke Risk Factors Obesity, Body mass index is 24.41 kg/m., BMI >/= 30 associated with increased stroke risk, recommend weight loss, diet and exercise as appropriate  PVD  Other Active Problems Emphysema Centrilobular emphysema in the lung apices Long standing history of tobacco smoking   Hospital day # 1  Mimi Ny, AGACNP-BC [facility] Pager: [phone]  ATTENDING NOTE: I reviewed above note and agree with the assessment and plan. Pt was seen and examined.   RN at the bedside. Pt lying in bed, tele showing afib, stat EKG done also consistent with afib. Pt lethargic but awake, alert, eyes open, orientated to age, place, time but mild psychomotor slowing. No aphasia, paucity of speech, mild dysarthria, following all simple commands. Able to name and repeat in dysarthric voice. No gaze palsy, tracking bilaterally, visual field full, PERRL. R facial droop. Tongue midline. Bilateral UEs 4/5, no drift. Bilaterally LEs 3/5, no drift. Sensation symmetrical bilaterally, b/l FTN intact grossly, gait not tested.   For detailed assessment and plan, please refer to above as I have made changes wherever appropriate.   Neurology will sign off. Please call with questions. Pt will follow up with stroke clinic NP at [facility] in about 4 weeks. Thanks for the consult.  Ary Cummins, MD PhD Stroke Neurology 08/04/2024 6:34 PM    To contact Stroke Continuity provider, please refer to [email or web address]. After hours, contact General Neurology

## 2024-08-05 ENCOUNTER — Inpatient Hospital Stay (HOSPITAL_COMMUNITY)

## 2024-08-05 DIAGNOSIS — E1142 Type 2 diabetes mellitus with diabetic polyneuropathy: Secondary | ICD-10-CM | POA: Diagnosis not present

## 2024-08-05 DIAGNOSIS — I6200 Nontraumatic subdural hemorrhage, unspecified: Secondary | ICD-10-CM | POA: Diagnosis not present

## 2024-08-05 DIAGNOSIS — Z9181 History of falling: Secondary | ICD-10-CM

## 2024-08-05 DIAGNOSIS — E785 Hyperlipidemia, unspecified: Secondary | ICD-10-CM | POA: Diagnosis not present

## 2024-08-05 DIAGNOSIS — E1151 Type 2 diabetes mellitus with diabetic peripheral angiopathy without gangrene: Secondary | ICD-10-CM | POA: Diagnosis not present

## 2024-08-05 DIAGNOSIS — J449 Chronic obstructive pulmonary disease, unspecified: Secondary | ICD-10-CM | POA: Diagnosis not present

## 2024-08-05 DIAGNOSIS — I10 Essential (primary) hypertension: Secondary | ICD-10-CM | POA: Diagnosis not present

## 2024-08-05 DIAGNOSIS — I4891 Unspecified atrial fibrillation: Secondary | ICD-10-CM | POA: Diagnosis not present

## 2024-08-05 DIAGNOSIS — I6389 Other cerebral infarction: Secondary | ICD-10-CM | POA: Diagnosis not present

## 2024-08-05 DIAGNOSIS — Z8679 Personal history of other diseases of the circulatory system: Secondary | ICD-10-CM | POA: Diagnosis not present

## 2024-08-05 DIAGNOSIS — F17210 Nicotine dependence, cigarettes, uncomplicated: Secondary | ICD-10-CM | POA: Diagnosis not present

## 2024-08-05 DIAGNOSIS — R918 Other nonspecific abnormal finding of lung field: Secondary | ICD-10-CM | POA: Diagnosis not present

## 2024-08-05 LAB — BASIC METABOLIC PANEL WITH GFR
Anion gap: 15 (ref 5–15)
BUN: 16 mg/dL (ref 8–23)
CO2: 20 mmol/L — ABNORMAL LOW (ref 22–32)
Calcium: 9.6 mg/dL (ref 8.9–10.3)
Chloride: 99 mmol/L (ref 98–111)
Creatinine, Ser: 0.82 mg/dL (ref 0.61–1.24)
GFR, Estimated: >60 mL/min
Glucose, Bld: 103 mg/dL — ABNORMAL HIGH (ref 70–99)
Potassium: 3.8 mmol/L (ref 3.5–5.1)
Sodium: 134 mmol/L — ABNORMAL LOW (ref 135–145)

## 2024-08-05 LAB — CBC
HCT: 43.5 % (ref 39.0–52.0)
Hemoglobin: 14.9 g/dL (ref 13.0–17.0)
MCH: 31.8 pg (ref 26.0–34.0)
MCHC: 34.3 g/dL (ref 30.0–36.0)
MCV: 92.9 fL (ref 80.0–100.0)
Platelets: 174 K/uL (ref 150–400)
RBC: 4.68 MIL/uL (ref 4.22–5.81)
RDW: 15.4 % (ref 11.5–15.5)
WBC: 7.8 K/uL (ref 4.0–10.5)
nRBC: 0 % (ref 0.0–0.2)

## 2024-08-05 LAB — PHOSPHORUS: Phosphorus: 3 mg/dL (ref 2.5–4.6)

## 2024-08-05 LAB — MAGNESIUM: Magnesium: 1.8 mg/dL (ref 1.7–2.4)

## 2024-08-05 MED ORDER — GUAIFENESIN ER 600 MG PO TB12
600.0000 mg | ORAL_TABLET | Freq: Two times a day (BID) | ORAL | Status: DC
  Filled 2024-08-05: qty 1

## 2024-08-05 MED ORDER — SODIUM CHLORIDE 0.9 % IV BOLUS
1000.0000 mL | Freq: Once | INTRAVENOUS | Status: AC
  Administered 2024-08-05: 1000 mL via INTRAVENOUS

## 2024-08-05 MED ORDER — GUAIFENESIN 100 MG/5ML PO LIQD
5.0000 mL | ORAL | Status: DC | PRN
  Administered 2024-08-07: 5 mL via ORAL
  Filled 2024-08-05: qty 5

## 2024-08-05 MED ORDER — AMLODIPINE BESYLATE 5 MG PO TABS
5.0000 mg | ORAL_TABLET | Freq: Every day | ORAL | Status: DC
  Administered 2024-08-05 – 2024-08-07 (×3): 5 mg via ORAL
  Filled 2024-08-05 (×3): qty 1

## 2024-08-05 MED ORDER — PROSOURCE TF20 ENFIT COMPATIBL EN LIQD
60.0000 mL | Freq: Every day | ENTERAL | Status: DC
  Administered 2024-08-05 – 2024-08-12 (×8): 60 mL
  Filled 2024-08-05 (×10): qty 60

## 2024-08-05 MED ORDER — OSMOLITE 1.5 CAL PO LIQD
1000.0000 mL | ORAL | Status: DC
  Administered 2024-08-05 – 2024-08-09 (×4): 1000 mL
  Filled 2024-08-05: qty 1000

## 2024-08-05 MED ORDER — THIAMINE MONONITRATE 100 MG PO TABS
100.0000 mg | ORAL_TABLET | Freq: Every day | ORAL | Status: AC
  Administered 2024-08-05 – 2024-08-11 (×7): 100 mg
  Filled 2024-08-05 (×7): qty 1

## 2024-08-05 NOTE — NC FL2 (Signed)
 " [place]  MEDICAID FL2 LEVEL OF CARE FORM     IDENTIFICATION  Patient Name: Mark Dickerson Birthdate: Jun 12, 1946 Sex: male Admission Date (Current Location): 08/03/2024  [facility] and [place] Number:  [employer] and Address:  [facility], [address] [place], [name] [address]      Provider Number: [id number]  Attending Physician Name and Address:  Trudy Mliss Dragon, MD  Relative Name and Phone Number:       Current Level of Care: Hospital Recommended Level of Care: Skilled Nursing Facility Prior Approval Number:    Date Approved/Denied:   PASRR Number: [id number]  Discharge Plan: SNF    Current Diagnoses: Patient Active Problem List   Diagnosis Date Noted   Acute stroke of basal ganglia (HCC) 08/03/2024   Subdural hematoma (HCC) 08/03/2024   Atrial fibrillation (HCC) 08/03/2024   Hypertension 10/12/2020   Hyperlipidemia 10/12/2020   Type 2 diabetes mellitus with complication, without long-term current use of insulin  (HCC) 10/12/2020   Non-ST elevation (NSTEMI) myocardial infarction (HCC) 10/12/2020   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/19/2020    Orientation RESPIRATION BLADDER Height & Weight     Self, Place  Normal Incontinent Weight: 180 lb (81.6 kg) Height:  6' (182.9 cm)  BEHAVIORAL SYMPTOMS/MOOD NEUROLOGICAL BOWEL NUTRITION STATUS      Incontinent Diet (see DC summary)  AMBULATORY STATUS COMMUNICATION OF NEEDS Skin   Extensive Assist Verbally Normal                       Personal Care Assistance Level of Assistance  Bathing, Feeding, Dressing Bathing Assistance: Limited assistance Feeding assistance: Limited assistance Dressing Assistance: Limited assistance     Functional Limitations Info  Sight, Speech Sight Info: Impaired   Speech Info: Impaired (dysarthria)    SPECIAL CARE FACTORS FREQUENCY  PT (By licensed PT), OT (By licensed OT), Speech therapy      PT Frequency: 5x/wk OT Frequency: 5x/wk     Speech Therapy Frequency: 5x/wk      Contractures Contractures Info: Not present    Additional Factors Info  Code Status, Allergies Code Status Info: Full Allergies Info: NKA           Current Medications (08/05/2024):  This is the current hospital active medication list Current Facility-Administered Medications  Medication Dose Route Frequency Provider Last Rate Last Admin   albuterol  (PROVENTIL ) (2.5 MG/3ML) 0.083% nebulizer solution 3 mL  3 mL Inhalation Q4H PRN [name], MD       amLODipine  (NORVASC ) tablet 5 mg  5 mg Oral Daily Tan, [name], MD   5 mg at 08/05/24 1001   dextrose  50 % solution 50 mL  1 ampule Intravenous Q8H PRN [name], MD       guaiFENesin  (MUCINEX ) 12 hr tablet 600 mg  600 mg Oral BID [name], MD       heparin  injection 5,000 Units  5,000 Units Subcutaneous Q8H [name], MD   5,000 Units at 08/05/24 0542   nicotine  (NICODERM CQ  - dosed in mg/24 hours) patch 14 mg  14 mg Transdermal Daily Tan, [name], MD   14 mg at 08/05/24 1002   rosuvastatin  (CRESTOR ) tablet 20 mg  20 mg Oral Daily [name], [name], NP   20 mg at 08/05/24 1001     Discharge Medications: Please see discharge summary for a list of discharge medications.  Relevant Imaging Results:  Relevant Lab  Results:   Additional Information SS#: [id number]  Mark CHRISTELLA Goodie, LCSW     "

## 2024-08-05 NOTE — Procedures (Signed)
 Cortrak  Person Inserting Tube:  Zillah Alexie, Olivia SAUNDERS, RD Tube Type:  Cortrak - 43 inches Tube Size:  10 Tube Location:  Left nare Secured by: Bridle Initial Placement:  Gastric Technique Used to Measure Tube Placement:  Marking at nare/corner of mouth Cortrak Secured At:  85 cm Initial Placement Verification:  Cortrak device (Registered Dieticians Only)  Cortrak Tube Team Note:  Consult received to place a Cortrak feeding tube.   No x-ray is required. RN may begin using tube.   If the tube becomes dislodged please keep the tube and contact the Cortrak team at [email or web address] for replacement.  If after hours and replacement cannot be delayed, place a NG tube and confirm placement with an abdominal x-ray.    Olivia Kenning, RD Registered Dietitian  See Amion for more information

## 2024-08-05 NOTE — Plan of Care (Signed)
" °  Problem: Education: Goal: Knowledge of disease or condition will improve 08/05/2024 0318 by Kathy Lonni DEL, RN Outcome: Progressing 08/05/2024 0315 by Kathy Lonni DEL, RN Outcome: Progressing Goal: Knowledge of secondary prevention will improve (MUST DOCUMENT ALL) 08/05/2024 0318 by Kathy Lonni DEL, RN Outcome: Progressing 08/05/2024 0315 by Kathy Lonni DEL, RN Outcome: Progressing Goal: Knowledge of patient specific risk factors will improve (DELETE if not current risk factor) 08/05/2024 0318 by Kathy Lonni DEL, RN Outcome: Progressing 08/05/2024 0315 by Kathy Lonni DEL, RN Outcome: Progressing   Problem: Ischemic Stroke/TIA Tissue Perfusion: Goal: Complications of ischemic stroke/TIA will be minimized Outcome: Progressing   Problem: Coping: Goal: Will verbalize positive feelings about self Outcome: Progressing Goal: Will identify appropriate support needs Outcome: Progressing   Problem: Health Behavior/Discharge Planning: Goal: Ability to manage health-related needs will improve Outcome: Progressing Goal: Goals will be collaboratively established with patient/family Outcome: Progressing   Problem: Self-Care: Goal: Ability to participate in self-care as condition permits will improve Outcome: Progressing Goal: Verbalization of feelings and concerns over difficulty with self-care will improve Outcome: Progressing Goal: Ability to communicate needs accurately will improve Outcome: Progressing   Problem: Nutrition: Goal: Risk of aspiration will decrease Outcome: Progressing Goal: Dietary intake will improve Outcome: Progressing   Problem: Education: Goal: Knowledge of General Education information will improve Description: Including pain rating scale, medication(s)/side effects and non-pharmacologic comfort measures Outcome: Progressing   Problem: Health Behavior/Discharge Planning: Goal: Ability to manage health-related needs will  improve Outcome: Progressing   Problem: Clinical Measurements: Goal: Ability to maintain clinical measurements within normal limits will improve Outcome: Progressing Goal: Will remain free from infection Outcome: Progressing Goal: Diagnostic test results will improve Outcome: Progressing Goal: Respiratory complications will improve Outcome: Progressing Goal: Cardiovascular complication will be avoided Outcome: Progressing   Problem: Activity: Goal: Risk for activity intolerance will decrease Outcome: Progressing   Problem: Nutrition: Goal: Adequate nutrition will be maintained Outcome: Progressing   Problem: Coping: Goal: Level of anxiety will decrease Outcome: Progressing   Problem: Elimination: Goal: Will not experience complications related to bowel motility Outcome: Progressing Goal: Will not experience complications related to urinary retention Outcome: Progressing   Problem: Pain Managment: Goal: General experience of comfort will improve and/or be controlled Outcome: Progressing   Problem: Safety: Goal: Ability to remain free from injury will improve Outcome: Progressing   Problem: Skin Integrity: Goal: Risk for impaired skin integrity will decrease Outcome: Progressing   "

## 2024-08-05 NOTE — TOC Progression Note (Signed)
 Transition of Care [facility]) - Progression Note    Patient Details  Name: Grover Robinson MRN: [id number] Date of Birth: September 05, 1945  [facility] CM/SW Contact  [name], [name] Phone Number: 08/05/2024, 4:03 PM  Clinical Narrative:   CSW updated by Rehab Admissions that patient will need SNF placement and family in agreement. Referral completed and faxed out, awaiting bed offers/availability. Noting per chart review that patient now made NPO with cortrak placed, medical workup ongoing. CSW to follow.    Expected Discharge Plan: Skilled Nursing Facility Barriers to Discharge: Continued Medical Work up, English As A Second Language Teacher               Expected Discharge Plan and Services   Discharge Planning Services: CM Consult Post Acute Care Choice: IP Rehab Living arrangements for the past 2 months: Single Family Home                                       Social Drivers of Health (SDOH) Interventions SDOH Screenings   Food Insecurity: Patient Declined (08/04/2024)  Housing: Unknown (08/04/2024)  Transportation Needs: Patient Declined (08/04/2024)  Utilities: Patient Declined (08/04/2024)  Social Connections: Patient Declined (08/04/2024)  Tobacco Use: High Risk (09/17/2023)   Received from [facility]    Readmission Risk Interventions     No data to display

## 2024-08-05 NOTE — Progress Notes (Signed)
 "  HD#2 SUBJECTIVE:  Patient Summary: Mark Dickerson is an independently-living (with family support) 79 y.o. 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   Overnight Events: No acute events overnight.  Interim History: On interview with the patient (who is providing history), he reports feeling more comfortable out of the bed today than yesterday. He reports that his difficulty swallowing and speaking have improved a little bit. His family reports similar observations with improvements in his speech. He endorses experiencing difficulty with the swallow test yesterday because they had him try a lot of different items. We discussed the testing he underwent as well as the results. He voiced understanding about his TTE and swallow study. He denies any difficulty breathing this morning, but endorses brief episodes of difficulty breathing when laying in the bed. He states that these episodes of difficulty breathing have been happening prior to his stroke/hospitalization and are transient. He also endorses difficulty coughing up some sputum in his chest. He feels like it can't get past his trachea. He denies having any other questions or concerns at this time.  OBJECTIVE:  Vital Signs: Vitals:   08/04/24 2009 08/04/24 2310 08/05/24 0424 08/05/24 0837  BP: (!) 175/84 (!) 154/60 (!) 151/75 (!) 166/80  Pulse: 89 88 69 83  Resp:    18  Temp: 97.6 F (36.4 C) 97.6 F (36.4 C) 98.9 F (37.2 C) 97.8 F (36.6 C)  TempSrc: Oral Oral  Oral Oral  SpO2: 98% 97% 97% 100%  Weight:      Height:       Supplemental O2: Room Air SpO2: 100 %  Filed Weights   08/03/24 1012  Weight: 81.6 kg     Intake/Output Summary (Last 24 hours) at 08/05/2024 1059 Last data filed at 08/05/2024 0600 Gross per 24 hour  Intake --  Output 400 ml  Net -400 ml   Net IO Since Admission: -400 mL [08/05/24 1059]  Physical Exam: Vitals reviewed.  Constitutional:      General: Well-nourished, well-kempt, and non-ill-appearing man in no acute distress sitting in hospital chair reclined. HENT:     Mouth/Throat: Edentatus upper.    Mouth: Mucous membranes are moist.  Eyes:    Extraocular Movements: Extraocular movements intact.     Conjunctiva/sclera: Conjunctivae normal.     Pupils: Pupils are equal, round, and reactive to light.  Cardiovascular:     Rate and Rhythm: Normal rate. Rhythm irregular.     Pulses: Normal radial pulses.     Heart sounds: No murmur heard. Pulmonary:     Effort: Pulmonary effort is normal. No respiratory distress.     Breath sounds: No rhonchi in the anterior lung fields.  Skin:    General: Skin is warm and dry.  Neurological:     Mental Status: He is alert and oriented to person, place, and time. He responds to commands and is answering questions appropriately. Able to count to 20 by 2s.    Cranial Nerves: Right lower facial droop present, improved from yesterday. Weakness in shrugging left trapezius against resistance. Tongue is now midline. Dysarthric speech and  impaired swallow. All other cranial nerves intact.    Motor: Left shoulder weakness against resistance, 4/5 strength. Psychiatric:        Mood and Affect: Mood normal.     Patient Lines/Drains/Airways Status     Active Line/Drains/Airways     Name Placement date Placement time Site Days   Peripheral IV 08/03/24 20 G Anterior;Right Forearm 08/03/24  1025  Forearm  2            Pertinent labs and imaging:      Latest Ref Rng & Units  08/05/2024    6:50 AM 08/04/2024    8:22 AM 08/03/2024   10:33 AM  CBC  WBC 4.0 - 10.5 K/uL 7.8  8.6  9.1   Hemoglobin 13.0 - 17.0 g/dL 85.0  84.3  84.8   Hematocrit 39.0 - 52.0 % 43.5  44.8  43.9   Platelets 150 - 400 K/uL 174  173  198        Latest Ref Rng & Units 08/05/2024    6:50 AM 08/04/2024    8:22 AM 08/03/2024   10:33 AM  CMP  Glucose 70 - 99 mg/dL 896  885  882   BUN 8 - 23 mg/dL 16  16  12    Creatinine 0.61 - 1.24 mg/dL 9.17  8.97  8.98   Sodium 135 - 145 mmol/L 134  133  132   Potassium 3.5 - 5.1 mmol/L 3.8  4.2  4.4   Chloride 98 - 111 mmol/L 99  94  95   CO2 22 - 32 mmol/L 20  24  23    Calcium  8.9 - 10.3 mg/dL 9.6  89.7  89.6   Total Protein 6.5 - 8.1 g/dL   7.8   Total Bilirubin 0.0 - 1.2 mg/dL   1.0   Alkaline Phos 38 - 126 U/L   69   AST 15 - 41 U/L   31   ALT 0 - 44 U/L   7     DG Swallowing Func-Speech Pathology Result Date: 08/04/2024 Table formatting from the original result was not included. Modified Barium Swallow Study Patient Details Name: Mark Dickerson MRN: [id number] Date of Birth: [birth year] Today's Date: 08/04/2024 HPI/PMH: HPI: Pt is a 79 y/o male presenting on 08/03/24 with slurred speech, R facial droop, and difficulty swallowing since 1/19. Found with small volume subdural hematoma along the falx, also with L basal ganglia infarct. Repeat CT stable. 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 Clinical Impression:  Pt has an oral and pharyngeal dysphagia s/p acute stroke. Pt would benefit from Dys 1/Moderately thick liquids as pt displayed overt aspiration of thinner liquids and decreased oral swallow efficiency. SLP f/u as able to determine if PO texture upgrade is appropriate.  Pt displayed oral impairments across all domains; suspect that deficits in tongue control, lingual motion, and amount/location of oral residue are related to motor impairments of CN VII and XII. Use of straw increased oral  efficiency, however decreased airway protection leading to trace aspiration of honey-thick liquid. Pt displayed moderate aspiration of thin and mildly-thick liquids as a result of reduced oral control as well as inadequate pharyngeal coordination and incomplete laryngeal vestibule closure. Pt sensed aspiration events, however was unable to clear material due to weak cough. Airway protection improved with honey thick liquids by cup/spoon when pt had more time for coordination. Pt displayed moderate pharyngeal residue with honey-thick liquids  from reduced pharyngeal contraction and particularly curved epiglottis, but this cleared with a swallow of puree. Whole pill administered with puree, however pt was unable to clear pill from oral cavity.  Factors that may increase risk of adverse event in presence of aspiration Noe & Lianne 2021): Factors that may increase risk of adverse event in presence of aspiration Noe & Lianne 2021): Reduced cognitive function; Weak cough; Inadequate oral hygiene; Limited mobility; Aspiration of thick, dense, and/or acidic materials Recommendations/Plan: Swallowing Evaluation Recommendations Swallowing Evaluation Recommendations Recommendations: PO diet PO Diet Recommendation: Dysphagia 1 (Pureed); Moderately thick liquids (Level 3, honey thick) Liquid Administration via: Cup; Spoon; No straw Medication Administration: Crushed with puree Supervision: Staff to assist with self-feeding; Full supervision/cueing for swallowing strategies Swallowing strategies  : Slow rate; Small bites/sips; Check for pocketing or oral holding; Follow solids with liquids; Minimize environmental distractions; Check for anterior loss Postural changes: Position pt fully upright for meals; Stay upright 30-60 min after meals Oral care recommendations: Oral care BID (2x/day) Caregiver Recommendations: Avoid jello, ice cream, thin soups, popsicles; Remove water pitcher; Have oral suction available Treatment  Plan Treatment Plan Treatment recommendations: Therapy as outlined in treatment plan below Follow-up recommendations: Acute inpatient rehab (3 hours/day) Functional status assessment: Patient has had a recent decline in their functional status and demonstrates the ability to make significant improvements in function in a reasonable and predictable amount of time. Treatment frequency: Min 2x/week Treatment duration: 2 weeks Interventions: Aspiration precaution training; Compensatory techniques; Patient/family education; Trials of upgraded texture/liquids; Diet toleration management by SLP Recommendations Recommendations for follow up therapy are one component of a multi-disciplinary discharge planning process, led by the attending physician.  Recommendations may be updated based on patient status, additional functional criteria and insurance authorization. Assessment: Orofacial Exam: Orofacial Exam Oral Cavity: Oral Hygiene: Lingual coating Oral Cavity - Dentition: Missing dentition Orofacial Anatomy: WFL Oral Motor/Sensory Function: Suspected cranial nerve impairment CN VII - Facial: Right motor impairment CN XII - Hypoglossal: Right motor impairment Anatomy: Anatomy: Suspected cervical osteophytes Boluses Administered: Boluses Administered Boluses Administered: Thin liquids (Level 0); Mildly thick liquids (Level 2, nectar thick); Moderately thick liquids (Level 3, honey thick); Puree; Solid  Oral Impairment Domain: Oral Impairment Domain Lip Closure: Escape beyond mid-chin (R-sided) Tongue control during bolus hold: Posterior escape of less than half of bolus Bolus preparation/mastication: Slow prolonged chewing/mashing with complete recollection Bolus transport/lingual motion: Slow tongue motion Oral residue: Majority of bolus remaining Location of oral residue : Floor of mouth; Lateral sulci; Tongue; Palate Initiation of pharyngeal swallow : Pyriform sinuses  Pharyngeal Impairment Domain: Pharyngeal Impairment  Domain Soft palate elevation: Trace column of contrast or air between SP and PW Laryngeal elevation: Partial superior movement of thyroid  cartilage/partial approximation of arytenoids to epiglottic petiole Anterior hyoid excursion: Complete anterior movement Epiglottic movement: Partial inversion Laryngeal vestibule closure: Incomplete, narrow column air/contrast in laryngeal vestibule Pharyngeal stripping wave : Present - diminished Pharyngeal contraction (A/P view only): N/A Pharyngoesophageal segment opening: Partial distention/partial duration, partial obstruction of flow Tongue base retraction: Trace column of contrast or air between tongue base and PPW Pharyngeal residue: Collection of residue within or on pharyngeal structures Location of pharyngeal residue: Diffuse (>3 areas)  Esophageal Impairment Domain: Esophageal Impairment Domain Esophageal clearance upright position: Esophageal retention Pill: Pill Consistency administered: Puree Puree: Impaired (see clinical impressions) Penetration/Aspiration Scale Score: Penetration/Aspiration Scale Score 1.  Material does not enter airway: Puree; Solid 7.  Material enters airway, passes BELOW cords and not ejected out despite cough attempt  by patient: Thin liquids (Level 0); Mildly thick liquids (Level 2, nectar thick); Moderately thick liquids (Level 3, honey thick) Compensatory Strategies: Compensatory Strategies Compensatory strategies: Yes Straw: Ineffective Ineffective Straw: Moderately thick liquid (Level 3, honey thick); Mildly thick liquid (Level 2, nectar thick); Thin liquid (Level 0) Multiple swallows: Ineffective Ineffective Multiple Swallows: Thin liquid (Level 0); Mildly thick liquid (Level 2, nectar thick); Moderately thick liquid (Level 3, honey thick) Other(comment): Ineffective (Hard cough after swallow) Ineffective Other(comment): Thin liquid (Level 0); Mildly thick liquid (Level 2, nectar thick)   General Information: Caregiver present: No  Diet  Prior to this Study: NPO   Temperature : Normal   Respiratory Status: WFL   Supplemental O2: None (Room air)   History of Recent Intubation: No  Behavior/Cognition: Alert; Cooperative; Pleasant mood; Impulsive; Requires cueing Self-Feeding Abilities: Needs assist with self-feeding Baseline vocal quality/speech: Normal Volitional Cough: Able to elicit Volitional Swallow: Able to elicit Exam Limitations: No limitations Goal Planning: Prognosis for improved oropharyngeal function: Good Barriers to Reach Goals: Cognitive deficits; Language deficits; Severity of deficits No data recorded Patient/Family Stated Goal: none stated Consulted and agree with results and recommendations: Patient Pain: Pain Assessment Pain Assessment: Faces Faces Pain Scale: 0 End of Session: Start Time:SLP Start Time (ACUTE ONLY): 1300 Stop Time: SLP Stop Time (ACUTE ONLY): 1331 Time Calculation:SLP Time Calculation (min) (ACUTE ONLY): 31 min Charges: SLP Evaluations $ SLP Speech Visit: 1 Visit SLP Evaluations $BSS Swallow: 1 Procedure $MBS Swallow: 1 Procedure $ SLP EVAL LANGUAGE/SOUND PRODUCTION: 1 Procedure SLP visit diagnosis: SLP Visit Diagnosis: Dysphagia, oropharyngeal phase (R13.12) Past Medical History: Past Medical History: Diagnosis Date  Diabetes mellitus without complication (HCC)   Hyperlipidemia   Hypertension   Myocardial infarction (HCC)   Peripheral arterial disease   Tobacco abuse  Past Surgical History: Past Surgical History: Procedure Laterality Date  ABDOMINAL AORTOGRAM W/LOWER EXTREMITY N/A 09/19/2020  Procedure: ABDOMINAL AORTOGRAM W/LOWER EXTREMITY;  Surgeon: Magda Debby SAILOR, MD;  Location: [facility];  Service: Cardiovascular;  Laterality: N/A;  AMPUTATION Left 11/01/2020  Procedure: LEFT THIRD TOE AMPUTATION;  Surgeon: Magda Debby SAILOR, MD;  Location: [facility] OR;  Service: Vascular;  Laterality: Left;  AORTA - BILATERAL FEMORAL ARTERY BYPASS GRAFT N/A 09/24/2020  Procedure: AORTA BIFEMORAL BYPASS GRAFT;  Surgeon:  Magda Debby SAILOR, MD;  Location: [facility] OR;  Service: Vascular;  Laterality: N/A;  AORTIC ENDARTERECETOMY N/A 09/24/2020  Procedure: AORTIC ENDARTERECETOMY;  Surgeon: Magda Debby SAILOR, MD;  Location: [facility] OR;  Service: Vascular;  Laterality: N/A;  ENDARTERECTOMY FEMORAL Bilateral 09/24/2020  Procedure: ENDARTERECTOMY BILATERAL COMMON FEMORAL ARTERIES;  Surgeon: Magda Debby SAILOR, MD;  Location: [facility] OR;  Service: Vascular;  Laterality: Bilateral;  FEMORAL-POPLITEAL BYPASS GRAFT Left 09/24/2020  Procedure: LEFT FEMORAL ARTERY TO BELOW KNEE POPLITEAL ARTERY BY PASS GRAFT USING NON REVERSED SAPPENOUS VEIN.;  Surgeon: Magda Debby SAILOR, MD;  Location: [facility] OR;  Service: Vascular;  Laterality: Left;  INTRAOPERATIVE ARTERIOGRAM Bilateral 09/24/2020  Procedure: INTRA OPERATIVE ARTERIOGRAM RIGHT AND LEFT LEG;  Surgeon: Magda Debby SAILOR, MD;  Location: [facility] OR;  Service: Vascular;  Laterality: Bilateral;  IR ANGIOGRAM EXTREMITY BILATERAL  08/31/2020  IR RADIOLOGIST EVAL & MGMT  08/14/2020  IR US  GUIDE VASC ACCESS LEFT  08/31/2020  IR US  GUIDE VASC ACCESS RIGHT  08/31/2020  VEIN HARVEST Left 09/24/2020  Procedure: VEIN HARVEST LEFT GREATER SAPPHENOUS;  Surgeon: Magda Debby SAILOR, MD;  Location: [facility] OR;  Service: Vascular;  Laterality: Left;  WOUND DEBRIDEMENT Left 11/01/2020  Procedure: DEBRIDEMENT LEFT FOOT  WOUND;  Surgeon: Magda Debby SAILOR, MD;  Location: [facility] OR;  Service: Vascular;  Laterality: Left; Note populated for Rocky Quan, Student SLP Leita SAILOR., M.A. CCC-SLP Acute Rehabilitation Services Office: [phone] Secure chat preferred 08/04/2024, 4:14 PM   ASSESSMENT/PLAN:  Assessment: Principal Problem:   Acute stroke of basal ganglia (HCC) Active Problems:   Hypertension   Hyperlipidemia   Type 2 diabetes mellitus with complication, without long-term current use of insulin  (HCC)   Subdural hematoma (HCC)   Atrial fibrillation (HCC)   Plan: #Acute stroke of left basal ganglia  Patient reports improvements in his ability to  swallow and speak which is corroborated by family. On exam, there is new motor weakness in the left trapezius and shoulder though his tongue is now midline and facial droop improving. His new, isolated motor weakness cannot be fully explained by the left basal ganglia stroke, but patient does not have any subjective concerns regarding this. We will continue monitoring with daily neuro exams. PT/OT/SLP continue to work with the patient on his activity levels, swallowing, and speech. OT recommendations pending. MBS by SLP showed deficits in tongue control and lingual motion contributing to oral and pharyngeal dysphagia, now recommending dysphagia I diet with moderately thick liquids and full observation. Neurology recommendations provided and signed off. PT recommending CIR however patient is not a candidate and now pursuing discharge to SNF near his home. - Neurology consulted, appreciate recs  - Continue rosuvastatin  20 mg daily  - Hold antiplatelet therapy d/t SDH until repeat CT scan in 2-3 weeks shows near resolution of SDH  - VTE prophylaxis: subcutaneous heparin   - SBP goal < 160, long-term goal of normotension  - Continuous cardiac monitoring  - Outpatient f/u with stroke clinic at [facility] in ~ 4 weeks - PT/OT/SLP eval and treat - Delirium precautions - 1L IVF d/t poor oral intake  #Subdural hematoma #Probable fall on antiplatelet therapy #History of Falls  Patient has had a concerning history of falls over the last 9 months, voiced by the patient's family. They show concern regarding his weight loss and weakness secondary to his suspected lung cancer. They deny any indications that the patient's falls are mechanical in nature. Most recent fall suspected to be prior to presentation at [facility] while on antiplatelet therapy. No development of new symptoms indicative of increasing ICP or bleeding. Neurology recommends holding antiplatelet therapy until further imaging of SDH in 2-3 weeks. Orthostatic  vitals are pending and we will continue to monitor his progress with therapy. - Neurology consulted, appreciate recs  - Repeat CT scan of the brain in 2-3 weeks to assess SDH - Hold antiplatelet therapy until repeat CT scan shows near resolution of SDH - Orthostatic vitals pending  #New-onset atrial fibrillation without RVR  Patient is in A-fib without RVR. No prior history of A-fib or anticoagulation. CHA2DS2-VASc of 7. Rates have been within normal limits. Holding AC d/t SDH. We will continue to monitor his rates and, if needed, will give him IV beta blocker. - Continuous cardiac monitoring - Hold home metoprolol  succinate 50 mg daily  #Hypertension  Patient has a history of poorly controlled hypertension presumably managed with amlodipine , metoprolol , benazepril , and hydrochlorothiazide. Neurology recommends SBP <160 with long-term goal of normotension. Patient's pressures have been elevated above systolic goal, ranging from 150-175. - Start home PO amlodipine  5 mg daily - Hold home metoprolol , benazepril , and hydrochlorothiazide  #Type 2 diabetes mellitus, non-insulin  dependent, controlled, complicated by peripheral neuropathy and large vessel disease Patient has diabetes  well controlled with metformin 500 mg daily. Most recent A1C in the hospital was A1C and BGLs ranging in the 110s without insulin . Given patient's control of his diabetes and limited PO intake, will hold sensitive SSI. We will reassess his need for sensitive SSI as his PO intake progresses. - Hold home metformin 500 mg daily - CBG TID AC  #Hyperlipidemia Patient has a history of HLD managed with pravastatin. Most recent lipid panel in the hospital showed LDL of 109, significantly above goal of <70, contributing to his acute stroke risk. Transitioned to rosuvastatin  20 mg daily. 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 Patient declined biopsy and treatment, and denies experiencing any pain or difficulty breathing due to COPD or mass. He is not in any respiratory distress and has been oxygenating well on room air. - Home albuterol  inhaler prn - Mucinex  600 mg bid  #PAD (with multiple LE revascularization procedures) #History of NSTEMI (post-procedure) Patient has an extensive history of PAD with multiple lower extremity revascularization procedures as well as post-procedure NSTEMI in 2022. For his PAD, he has been on antiplatelet therapy which has been held in context of his SDH.  - Hold home antiplatelet therapy per recommendations above  Best Practice: Diet: Dysphagia 1; Moderately thick liquids (level 3) IVF: Fluids: 0.9NS, Rate: 1000 cc bolus VTE: heparin  injection 5,000 Units Start: 08/04/24 2200 Place and maintain sequential compression device Start: 08/03/24 1848 Code: Full  Disposition planning: Therapy Recs: CIR, DME: none Family Contact: brother and sister-in-law, at bedside. DISPO: Anticipated discharge in 1-2 days to SNF pending insurance auth.  Signature:  Elia LULLA [facility]  10:59 AM, 08/05/2024  On Call pager [phone]  Attestation for Student Documentation:  I personally was present and re-performed the history, physical exam and medical decision-making activities of this service and have verified that the service and findings are accurately documented in the students note.  [name], MD 08/05/2024, 11:21 AM  "

## 2024-08-05 NOTE — Care Management Important Message (Signed)
 Important Message  Patient Details  Name: Mark Dickerson MRN: [id number] Date of Birth: 1945-08-15   Important Message Given:  Yes - Medicare IM     Jennie Laneta Dragon 08/05/2024, 4:33 PM

## 2024-08-05 NOTE — Progress Notes (Signed)
 Physical Therapy Treatment Patient Details Name: Mark Dickerson MRN: [id number] DOB: April 01, 1946 Today's Date: 08/05/2024   History of Present Illness Pt is 79 y/o male presenting on 08/03/24 with slurred speech, R facial droop since 1/19. Found with small volume subdural hematoma along the falx, also with L basal ganglia infarct. Repeat CT stable. PMH includes: DM, HTN, MI, PAD.    PT Comments  Pt tolerates treatment well, progressing to ambulation at bedside this session. Pt continues to lack LE power to stand without PT cues to improve transfer efficiency with hand placement and increased anterior weight shift. Patient will benefit from continued inpatient follow up therapy, <3 hours/day.    If plan is discharge home, recommend the following: A little help with walking and/or transfers;A lot of help with bathing/dressing/bathroom;Assistance with cooking/housework;Assist for transportation;Help with stairs or ramp for entrance   Can travel by private vehicle     Yes  Equipment Recommendations  BSC/3in1    Recommendations for Other Services       Precautions / Restrictions Precautions Precautions: Fall;Other (comment) Recall of Precautions/Restrictions: Intact Precaution/Restrictions Comments: dysarthric Restrictions Weight Bearing Restrictions Per Provider Order: No     Mobility  Bed Mobility Overal bed mobility: Needs Assistance Bed Mobility: Supine to Sit, Sit to Supine     Supine to sit: Min assist Sit to supine: Min assist        Transfers Overall transfer level: Needs assistance Equipment used: Rolling walker (2 wheels) Transfers: Sit to/from Stand Sit to Stand: Min assist, Contact guard assist           General transfer comment: minA initially progressing to CGA with verbal cueing. PT provides cues for hand placement and to encourage increased trunk flexion and anterior weight shift when initiating transfers    Ambulation/Gait Ambulation/Gait  assistance: Contact guard assist Gait Distance (Feet): 40 Feet Assistive device: Rolling walker (2 wheels) Gait Pattern/deviations: Step-to pattern Gait velocity: reduced Gait velocity interpretation: <1.8 ft/sec, indicate of risk for recurrent falls   General Gait Details: slowed step-to gait, pt remains on wall monitor telemetry thus PT must walk at bedside back and forth as wires allow   Stairs             Wheelchair Mobility     Tilt Bed    Modified Rankin (Stroke Patients Only) Modified Rankin (Stroke Patients Only) Pre-Morbid Rankin Score: No symptoms Modified Rankin: Moderately severe disability     Balance Overall balance assessment: Needs assistance Sitting-balance support: No upper extremity supported, Feet supported Sitting balance-Leahy Scale: Fair     Standing balance support: Bilateral upper extremity supported, Reliant on assistive device for balance Standing balance-Leahy Scale: Poor                              Communication Communication Communication: Impaired Factors Affecting Communication: Reduced clarity of speech  Cognition Arousal: Alert Behavior During Therapy: WFL for tasks assessed/performed   PT - Cognitive impairments: No family/caregiver present to determine baseline, Memory, Safety/Judgement                         Following commands: Impaired Following commands impaired: Follows one step commands with increased time    Cueing Cueing Techniques: Verbal cues, Visual cues  Exercises      General Comments General comments (skin integrity, edema, etc.): VSS on RA, pt with frequent drooling from L side of mouth  Pertinent Vitals/Pain Pain Assessment Pain Assessment: No/denies pain    Home Living                          Prior Function            PT Goals (current goals can now be found in the care plan section) Acute Rehab PT Goals Patient Stated Goal: to get better Progress  towards PT goals: Progressing toward goals    Frequency    Min 2X/week      PT Plan      Co-evaluation              AM-PAC PT 6 Clicks Mobility   Outcome Measure  Help needed turning from your back to your side while in a flat bed without using bedrails?: A Little Help needed moving from lying on your back to sitting on the side of a flat bed without using bedrails?: A Little Help needed moving to and from a bed to a chair (including a wheelchair)?: A Little Help needed standing up from a chair using your arms (e.g., wheelchair or bedside chair)?: A Little Help needed to walk in hospital room?: A Little Help needed climbing 3-5 steps with a railing? : Total 6 Click Score: 16    End of Session Equipment Utilized During Treatment: Gait belt Activity Tolerance: Patient tolerated treatment well Patient left: in bed;with call bell/phone within reach;with bed alarm set Nurse Communication: Mobility status PT Visit Diagnosis: Other abnormalities of gait and mobility (R26.89)     Time: 8681-8658 PT Time Calculation (min) (ACUTE ONLY): 23 min  Charges:    $Gait Training: 8-22 mins $Therapeutic Activity: 8-22 mins PT General Charges $$ ACUTE PT VISIT: 1 Visit                     Bernardino JINNY Ruth, PT, DPT Acute Rehabilitation Office [phone]    Bernardino JINNY Ruth 08/05/2024, 1:52 PM

## 2024-08-05 NOTE — Evaluation (Signed)
 Occupational Therapy Evaluation Patient Details Name: Mark Dickerson MRN: [id number] DOB: Jul 19, 1945 Today's Date: 08/05/2024   History of Present Illness   Pt is 79 y/o male presenting on 08/03/24 with slurred speech, R facial droop since 1/19. Found with small volume subdural hematoma along the falx, also with L basal ganglia infarct. Repeat CT stable. PMH includes: DM, HTN, MI, PAD.     Clinical Impressions PTA, pt lives alone, typically Modified Independent with ADLs, household IADLs and mobility using RW. Pt reports brother assists with transportation/getting groceries as needed. Pt presents now with deficits in speech, strength, standing balance, coordination and cognition. Pt requires Mod A for bed mobility, initial Mod A to stand at bedside but able to progress transfers to Min-CGA during session. Pt requires Min A overall for ADLs. Nursing at bedside to assist in transferring pt to tele box for further OOB activity though x2 issues w/ tele boxes and secretary initiated a maintenance ticket to address. Based on PLOF and rehab potential, recommend intensive therapy services > 3 hours therapy per day.     If plan is discharge home, recommend the following:   A lot of help with walking and/or transfers;A lot of help with bathing/dressing/bathroom;Assistance with cooking/housework;Direct supervision/assist for medications management;Direct supervision/assist for financial management;Assist for transportation;Help with stairs or ramp for entrance     Functional Status Assessment   Patient has had a recent decline in their functional status and demonstrates the ability to make significant improvements in function in a reasonable and predictable amount of time.     Equipment Recommendations   Tub/shower seat     Recommendations for Other Services   Rehab consult     Precautions/Restrictions   Precautions Precautions: Fall;Other (comment) Restrictions Weight Bearing  Restrictions Per Provider Order: No     Mobility Bed Mobility Overal bed mobility: Needs Assistance Bed Mobility: Supine to Sit     Supine to sit: Mod assist, HOB elevated     General bed mobility comments: assist to lift trunk and assist needed to scoot to gain balance    Transfers Overall transfer level: Needs assistance Equipment used: Rolling walker (2 wheels) Transfers: Sit to/from Stand, Bed to chair/wheelchair/BSC Sit to Stand: Mod assist, Contact guard assist     Step pivot transfers: Min assist     General transfer comment: Initial Mod A to stand from bedside and step 4 ft to chair with RW. Able to later stand from recliner with CGA pushing from armrests      Balance Overall balance assessment: Needs assistance Sitting-balance support: Bilateral upper extremity supported, Feet supported Sitting balance-Leahy Scale: Poor     Standing balance support: Bilateral upper extremity supported, During functional activity, Reliant on assistive device for balance Standing balance-Leahy Scale: Poor                             ADL either performed or assessed with clinical judgement   ADL Overall ADL's : Needs assistance/impaired Eating/Feeding: Supervision/ safety;Sitting Eating/Feeding Details (indicate cue type and reason): able to drink from cup of thickened juice after assist to open Grooming: Minimal assistance;Sitting   Upper Body Bathing: Minimal assistance;Sitting   Lower Body Bathing: Minimal assistance;Sitting/lateral leans;Sit to/from stand Lower Body Bathing Details (indicate cue type and reason): able to translate washcloth from R<>L hand for peri care in standing, no overt LOB with this task Upper Body Dressing : Minimal assistance;Sitting   Lower Body Dressing: Minimal assistance;Sitting/lateral leans;Sit  to/from Database Administrator: Minimal assistance;Stand-pivot;BSC/3in1;Rolling walker (2 wheels)   Toileting- Clothing Manipulation  and Hygiene: Minimal assistance;Sitting/lateral lean;Sit to/from stand               Vision Baseline Vision/History: 1 Wears glasses Ability to See in Adequate Light: 0 Adequate Patient Visual Report: Other (comment) (R inattention) Vision Assessment?: Vision impaired- to be further tested in functional context;Yes Eye Alignment: Within Functional Limits Ocular Range of Motion: Restricted on the right;Impaired-to be further tested in functional context Alignment/Gaze Preference: Within Defined Limits Tracking/Visual Pursuits: Impaired - to be further tested in functional context Saccades: Decreased speed of saccadic movement;Impaired - to be further tested in functional context Convergence: Impaired - to be further tested in functional context Visual Fields: Right visual field deficit Additional Comments: Able to track to R side but decreased smoothness and noted difficulty following targets requiring R eye to catch up. peripheral testing similiar to R and L.     Perception Perception: Impaired Preception Impairment Details: Inattention/Neglect     Praxis         Pertinent Vitals/Pain Pain Assessment Pain Assessment: No/denies pain     Extremity/Trunk Assessment Upper Extremity Assessment Upper Extremity Assessment: Generalized weakness;Right hand dominant;RUE deficits/detail RUE Deficits / Details: Decreased coordination but able to use this UE functionally RUE Coordination: decreased gross motor   Lower Extremity Assessment Lower Extremity Assessment: Defer to PT evaluation   Cervical / Trunk Assessment Cervical / Trunk Assessment: Normal   Communication Communication Communication: Impaired Factors Affecting Communication: Reduced clarity of speech   Cognition Arousal: Alert Behavior During Therapy: Flat affect Cognition: Difficult to assess Difficult to assess due to: Impaired communication           OT - Cognition Comments: pleasant, initially reports  year as 2007 but able to accurately identify month. fair following of commands, unaware of urine incontinence in bed.                 Following commands: Impaired Following commands impaired: Follows one step commands with increased time, Follows multi-step commands with increased time     Cueing  General Comments   Cueing Techniques: Verbal cues;Tactile cues  RNs at bedside to assist in switching pt from hardwire tele. Attempted x 2 boxes with increased time though unsuccessful in setting up. Notified secretary who placed a ticket to assess the two tele boxes   Exercises     Shoulder Instructions      Home Living Family/patient expects to be discharged to:: Private residence Living Arrangements: Alone Available Help at Discharge: Family Type of Home: House Home Access: Stairs to enter Secretary/administrator of Steps: 2 Entrance Stairs-Rails: Left Home Layout: One level     Bathroom Shower/Tub: Producer, Television/film/video: Standard     Home Equipment: Agricultural Consultant (2 wheels);Grab bars - tub/shower;Cane - single point;Hand held shower head          Prior Functioning/Environment Prior Level of Function : Independent/Modified Independent;Driving             Mobility Comments: reports use of RW ADLs Comments: Reports Mod I for ADLs, basic household IADLs. pt reports brother has been assisting picking up groceries, etc    OT Problem List: Decreased strength;Decreased activity tolerance;Impaired balance (sitting and/or standing);Decreased cognition;Decreased coordination;Impaired vision/perception;Decreased knowledge of use of DME or AE;Decreased knowledge of precautions   OT Treatment/Interventions: Self-care/ADL training;Therapeutic exercise;Energy conservation;DME and/or AE instruction;Therapeutic activities;Patient/family education;Balance training      OT Goals(Current  goals can be found in the care plan section)   Acute Rehab OT Goals Patient  Stated Goal: pt hopeful to return to independence, be able to drink regular coffee OT Goal Formulation: With patient Time For Goal Achievement: 08/19/24 Potential to Achieve Goals: Good   OT Frequency:  Min 2X/week    Co-evaluation              AM-PAC OT 6 Clicks Daily Activity     Outcome Measure Help from another person eating meals?: A Little Help from another person taking care of personal grooming?: A Little Help from another person toileting, which includes using toliet, bedpan, or urinal?: A Little Help from another person bathing (including washing, rinsing, drying)?: A Little Help from another person to put on and taking off regular upper body clothing?: A Little Help from another person to put on and taking off regular lower body clothing?: A Little 6 Click Score: 18   End of Session Equipment Utilized During Treatment: Gait belt;Rolling walker (2 wheels) Nurse Communication: Mobility status  Activity Tolerance: Patient tolerated treatment well Patient left: in chair;with call bell/phone within reach;with chair alarm set  OT Visit Diagnosis: Unsteadiness on feet (R26.81);Other abnormalities of gait and mobility (R26.89);Muscle weakness (generalized) (M62.81)                Time: 0820-0902 OT Time Calculation (min): 42 min Charges:  OT General Charges $OT Visit: 1 Visit OT Evaluation $OT Eval Moderate Complexity: 1 Mod OT Treatments $Self Care/Home Management : 8-22 mins $Therapeutic Activity: 8-22 mins  Mliss NOVAK, OTR/L Acute Rehab Services Office: [phone]   Mliss Fish 08/05/2024, 9:22 AM

## 2024-08-05 NOTE — Progress Notes (Signed)
" °  Inpatient Rehabilitation Admissions Coordinator   Met with patient, brother, Beryl, and sister In law, Erminio at bedside for rehab assessment. We discussed goals and expectations of a possible CIR admit. Prior to admit patient lived alone and 2 brothers and their wives checked in on him daily. They are unable to provide him 24/7 supervision after discharge from CIR. I dicussed SNF rehab and they are in agreement. They prefer in the [place] area. I will alert acute team and TOC. We will sign off, for we do not expect him to get to an independent level to return home alone with daily check in by family members as before. Please call me with any questions.   Heron Leavell, RN, MSN Rehab Admissions Coordinator [phone]   "

## 2024-08-05 NOTE — Progress Notes (Addendum)
 Speech Language Pathology Treatment: Dysphagia  Patient Details Name: Mark Dickerson MRN: [id number] DOB: 07/28/1945 Today's Date: 08/05/2024 Time: 8962-8897 SLP Time Calculation (min) (ACUTE ONLY): 25 min  Assessment / Plan / Recommendation Clinical Impression  Pt was seen with brother and sister-in-law present, with education provided by SLP about MBS results and recommendations. While pt was sitting upright in his chair he began to self-feed trials from current diet with fairly consistent coughing noted after sips of honey thick liquids by cup. He and his brother report some coughing during breakfast; this was confirmed with nursing and NT as well. Cues to facilitate smaller sips did not reduce coughing, but when offered via spoon, coughing was eliminated. No overt coughing was seen with purees, although with both consistencies there is oral residue. Pt was also set up with yankauer.   PLAN: Continue Dys 1 (puree) diet and honey thick liquids but given coughing that has been observed that is concerning for aspiration, would focus on offering via spoon. Encouraged pt/family to inform nursing if coughing with POs persists. In the event he should continue to have frequent coughing with PO intake over the weekend, nursing can message the SLP team via secure chat and can consider making pt NPO.   ADDENDUM: SLP notified by RN this afternoon that pt has continued to have a lot of coughing with PO intake during lunch meal. MD also notified and spoke with pt. Plan now is to change to NPO and place Cortrak. SLP will still follow for potential to be able to resume modified diet.   HPI HPI: Pt is a 79 y/o male presenting on 08/03/24 with slurred speech, R facial droop, and difficulty swallowing since 1/19. Found with small volume subdural hematoma along the falx, also with L basal ganglia infarct. Repeat CT stable. 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      SLP Plan  Continue with current plan of care        Swallow Evaluation Recommendations   Recommendations: PO diet PO Diet Recommendation: Dysphagia 1 (Pureed);Moderately thick liquids (Level 3, honey thick) Liquid Administration via: Spoon Medication Administration: Crushed with puree Supervision: Staff to assist with self-feeding;Full supervision/cueing for swallowing strategies Postural changes: Position pt fully upright for meals;Stay upright 30-60 min after meals Oral care recommendations: Oral care BID (2x/day) Caregiver Recommendations: Avoid jello, ice cream, thin soups, popsicles;Remove water  pitcher;Have oral suction available     Recommendations                         Frequent or constant Supervision/Assistance Dysphagia, oropharyngeal phase (R13.12)     Continue with current plan of care     Leita SAILOR., M.A. CCC-SLP Acute Rehabilitation Services Office: [phone]  Secure chat preferred   08/05/2024, 12:00 PM

## 2024-08-05 NOTE — Progress Notes (Signed)
 Initial Nutrition Assessment  DOCUMENTATION CODES:   Non-severe (moderate) malnutrition in context of chronic illness  INTERVENTION:  Initiate tube feeding via Cortrak: initiate at 20 ml/h and increase 10 ml q10h until goal rate reached Osmolite 1.5 at 50 ml/h (1200 ml per day) Prosource TF20 60 ml daily Provides 1880 kcal, 95 gm protein, 914 ml free water daily  Pt is at risk for refeeding syndrome given moderate malnutrition. Monitor magnesium  and phosphorus daily x 3 days, MD to replete as needed. 100mg  thiamine x 5 days  Monitor continued progress with SLP and diet recommendations  NUTRITION DIAGNOSIS:   Moderate Malnutrition related to chronic illness as evidenced by moderate muscle depletion, severe muscle depletion, moderate fat depletion.  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 Admitted with slurred speech and R facial droop, diagnosed stroke and SDH.  1/21 admitted 1/22 advanced to DYS 1, honey thick 1/23 made NPO after repeated coughing episodes, Cortrak placed, TF initiated   Pt resting in bed at time of assessment. Pt's speech remains slow and jumbled but responds appropriately to questions and providers have noted improvements in speech. Pt reported swallowing difficulty and had multiple bouts of coughing following PO intake which led to pt being made NPO. Pt unable to provide recent intake information since having a diet order yesterday.  Pt reports eating 2x per day PTA. He reports eating out or cooking for his meals. Pt unable to provide thorough hx but states he ate variety and did not avoid any specific foods.   Limited wt hx available in chart. Nutrition  focused physical exam shows moderate fat and moderate to severe muscle depletions, indicative of malnutrition. More severe muscle depletions may be exacerbated by atrophy due to decreased activity in combination with malnutrition. Suspect malnutrition related to chronic illness and has been ongoing issue for pt.  Pt reports using walker at baseline but states he lived alone and was independent PTA.  Recommend short term enteral nutrition to meet calorie and protein needs while pt continues to work with SLP for diet advancements. If pt able to be advanced, recommend adding oral supplements to promote PO intake. If unable to pass swallow evaluation soon, recommend PMT consult to establish GOC to discuss long term options for nutrition as Cortrak may be barrier to discharge.   Nutritionally Relevant Medications: Sodium chloride  IV  Labs reviewed: Sodium 134 A1c 6.1 No recent CBG Potassium WNL at baseline Phos and Mag pending  Admit weight: 81.6 kg   NUTRITION - FOCUSED PHYSICAL EXAM:  Flowsheet Row Most Recent Value  Orbital Region Moderate depletion  Upper Arm Region Moderate depletion  Thoracic and Lumbar Region Moderate depletion  Buccal Region Mild depletion  Temple Region Moderate depletion  Clavicle Bone Region Moderate depletion  Clavicle and Acromion Bone Region Moderate depletion  Scapular Bone Region Moderate depletion  Dorsal Hand Severe depletion  Patellar Region Severe depletion  Anterior Thigh Region Severe depletion  Posterior Calf Region Severe depletion  Edema (RD Assessment) None  Hair Reviewed  Eyes Reviewed  Mouth Reviewed  Skin Reviewed  Nails Reviewed    Diet Order:   Diet Order  Diet NPO time specified  Diet effective now                   EDUCATION NEEDS:   No education needs have been identified at this time  Skin:  Skin Assessment: Reviewed RN Assessment  Last BM:  1/19  Height:   Ht Readings from Last 1 Encounters:   08/03/24 6' (1.829 m)    Weight:   Wt Readings from Last 1 Encounters:  08/03/24 81.6 kg    Ideal Body Weight:  80.91 kg  BMI:  Body mass index is 24.41 kg/m.  Estimated Nutritional Needs:   Kcal:  1900-2100  Protein:  85-100g  Fluid:  2L    Josette Glance, MS, RDN, LDN Clinical Dietitian I Please reach out via secure chat

## 2024-08-06 DIAGNOSIS — R918 Other nonspecific abnormal finding of lung field: Secondary | ICD-10-CM | POA: Diagnosis not present

## 2024-08-06 DIAGNOSIS — I4891 Unspecified atrial fibrillation: Secondary | ICD-10-CM | POA: Diagnosis not present

## 2024-08-06 DIAGNOSIS — E1142 Type 2 diabetes mellitus with diabetic polyneuropathy: Secondary | ICD-10-CM | POA: Diagnosis not present

## 2024-08-06 DIAGNOSIS — E785 Hyperlipidemia, unspecified: Secondary | ICD-10-CM | POA: Diagnosis not present

## 2024-08-06 DIAGNOSIS — I252 Old myocardial infarction: Secondary | ICD-10-CM

## 2024-08-06 DIAGNOSIS — I739 Peripheral vascular disease, unspecified: Secondary | ICD-10-CM | POA: Diagnosis not present

## 2024-08-06 DIAGNOSIS — E440 Moderate protein-calorie malnutrition: Secondary | ICD-10-CM | POA: Insufficient documentation

## 2024-08-06 DIAGNOSIS — J449 Chronic obstructive pulmonary disease, unspecified: Secondary | ICD-10-CM

## 2024-08-06 DIAGNOSIS — I6389 Other cerebral infarction: Secondary | ICD-10-CM | POA: Diagnosis not present

## 2024-08-06 DIAGNOSIS — S065XAA Traumatic subdural hemorrhage with loss of consciousness status unknown, initial encounter: Secondary | ICD-10-CM

## 2024-08-06 DIAGNOSIS — F17210 Nicotine dependence, cigarettes, uncomplicated: Secondary | ICD-10-CM | POA: Diagnosis not present

## 2024-08-06 DIAGNOSIS — I639 Cerebral infarction, unspecified: Principal | ICD-10-CM

## 2024-08-06 DIAGNOSIS — I6200 Nontraumatic subdural hemorrhage, unspecified: Secondary | ICD-10-CM | POA: Diagnosis not present

## 2024-08-06 DIAGNOSIS — E1151 Type 2 diabetes mellitus with diabetic peripheral angiopathy without gangrene: Secondary | ICD-10-CM | POA: Diagnosis not present

## 2024-08-06 DIAGNOSIS — I10 Essential (primary) hypertension: Secondary | ICD-10-CM | POA: Diagnosis not present

## 2024-08-06 LAB — MAGNESIUM: Magnesium: 1.9 mg/dL (ref 1.7–2.4)

## 2024-08-06 LAB — CBC
HCT: 41.6 % (ref 39.0–52.0)
Hemoglobin: 14.4 g/dL (ref 13.0–17.0)
MCH: 31.9 pg (ref 26.0–34.0)
MCHC: 34.6 g/dL (ref 30.0–36.0)
MCV: 92 fL (ref 80.0–100.0)
Platelets: 192 10*3/uL (ref 150–400)
RBC: 4.52 MIL/uL (ref 4.22–5.81)
RDW: 15.3 % (ref 11.5–15.5)
WBC: 7.6 10*3/uL (ref 4.0–10.5)
nRBC: 0 % (ref 0.0–0.2)

## 2024-08-06 LAB — BASIC METABOLIC PANEL WITH GFR
Anion gap: 12 (ref 5–15)
BUN: 19 mg/dL (ref 8–23)
CO2: 23 mmol/L (ref 22–32)
Calcium: 9.7 mg/dL (ref 8.9–10.3)
Chloride: 101 mmol/L (ref 98–111)
Creatinine, Ser: 0.81 mg/dL (ref 0.61–1.24)
GFR, Estimated: >60 mL/min
Glucose, Bld: 127 mg/dL — ABNORMAL HIGH (ref 70–99)
Potassium: 3.2 mmol/L — ABNORMAL LOW (ref 3.5–5.1)
Sodium: 136 mmol/L (ref 135–145)

## 2024-08-06 LAB — PHOSPHORUS: Phosphorus: 2.9 mg/dL (ref 2.5–4.6)

## 2024-08-06 LAB — GLUCOSE, CAPILLARY
Glucose-Capillary: 127 mg/dL — ABNORMAL HIGH (ref 70–99)
Glucose-Capillary: 130 mg/dL — ABNORMAL HIGH (ref 70–99)
Glucose-Capillary: 130 mg/dL — ABNORMAL HIGH (ref 70–99)
Glucose-Capillary: 136 mg/dL — ABNORMAL HIGH (ref 70–99)
Glucose-Capillary: 162 mg/dL — ABNORMAL HIGH (ref 70–99)

## 2024-08-06 MED ORDER — LABETALOL HCL 5 MG/ML IV SOLN
5.0000 mg | Freq: Once | INTRAVENOUS | Status: AC
  Administered 2024-08-06: 5 mg via INTRAVENOUS
  Filled 2024-08-06: qty 4

## 2024-08-06 MED ORDER — BENAZEPRIL HCL 20 MG PO TABS
40.0000 mg | ORAL_TABLET | Freq: Every day | ORAL | Status: DC
  Administered 2024-08-06 – 2024-08-07 (×2): 40 mg via ORAL
  Filled 2024-08-06 (×3): qty 2

## 2024-08-06 NOTE — Progress Notes (Signed)
 "  HD#3 SUBJECTIVE:  Patient Summary: Mark Dickerson is an independently-living (with family support) 79 y.o. 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   Overnight Events: NAEOV.   Interim History: Seen on rounds, lying in bed. Denies SOB or chest pain. S/p placement of cortrack yesterday.  Mildly hypertensive(above BP goal< 160) for the SDH.   OBJECTIVE:  Vital Signs: Vitals:   08/06/24 0040 08/06/24 0500 08/06/24 0519 08/06/24 0752  BP: (!) 172/84  (!) 163/80 (!) 165/94  Pulse: 84  68 71  Resp: 20  20 20   Temp: 98.2 F (36.8 C)  98 F (36.7 C) 97.7 F (36.5 C)  TempSrc: Oral  Oral Oral  SpO2: 97%  96% 94%  Weight:  84.4 kg    Height:       Supplemental O2: Room Air SpO2: 94 %  Filed Weights   08/03/24 1012 08/06/24 0500  Weight: 81.6 kg 84.4 kg     Intake/Output Summary (Last 24 hours) at 08/06/2024 0913 Last data filed at 08/06/2024 0600 Gross per 24 hour  Intake 163.33 ml  Output 300 ml  Net -136.67 ml   Net IO Since Admission: -536.67 mL [08/06/24 0913]  Physical Exam: Vitals reviewed.  Constitutional:      General: Well-nourished, well-kempt, and non-ill-appearing man in no acute distress sitting in hospital chair reclined. HENT:     Mouth/Throat: Edentatus upper.    Mouth: Mucous membranes are moist.  Eyes:    Extraocular Movements: Extraocular movements intact.     Conjunctiva/sclera: Conjunctivae normal.     Pupils: Pupils are equal, round, and reactive to light.   Cardiovascular:     Rate and Rhythm: Normal rate. Rhythm irregular.     Pulses: Normal radial pulses.     Heart sounds: No murmur heard. Pulmonary:     Effort: Pulmonary effort is normal. No respiratory distress.     Breath sounds: No rhonchi in the anterior lung fields.  Skin:    General: Skin is warm and dry.  Neurological:     Mental Status: He is alert and oriented to person, place, and time. He responds to commands and is answering questions appropriately.     Cranial Nerves: Right lower facial droop present, with improved strength in shrugging left trapezius against resistance. Tongue is now midline. Dysarthric speech and impaired swallow. All other cranial nerves intact.    Motor: Left shoulder weakness against resistance, 4/5 strength. Psychiatric:        Mood and Affect: Mood normal.     Patient Lines/Drains/Airways Status     Active Line/Drains/Airways     Name Placement date Placement time Site Days   Peripheral IV 08/03/24 20 G Anterior;Right Forearm 08/03/24  1025  Forearm  3   Small Bore Feeding Tube 10 Fr. Left nare Marking at nare/corner of mouth 85 cm 08/05/24  1524  Left nare  1            Pertinent labs and imaging:      Latest Ref Rng & Units 08/06/2024    7:56 AM 08/05/2024    6:50  AM 08/04/2024    8:22 AM  CBC  WBC 4.0 - 10.5 K/uL 7.6  7.8  8.6   Hemoglobin 13.0 - 17.0 g/dL 85.5  85.0  84.3   Hematocrit 39.0 - 52.0 % 41.6  43.5  44.8   Platelets 150 - 400 K/uL 192  174  173        Latest Ref Rng & Units 08/05/2024    6:50 AM 08/04/2024    8:22 AM 08/03/2024   10:33 AM  CMP  Glucose 70 - 99 mg/dL 896  885  882   BUN 8 - 23 mg/dL 16  16  12    Creatinine 0.61 - 1.24 mg/dL 9.17  8.97  8.98   Sodium 135 - 145 mmol/L 134  133  132   Potassium 3.5 - 5.1 mmol/L 3.8  4.2  4.4   Chloride 98 - 111 mmol/L 99  94  95   CO2 22 - 32 mmol/L 20  24  23    Calcium  8.9 - 10.3 mg/dL 9.6  89.7  89.6   Total Protein 6.5 - 8.1 g/dL   7.8   Total Bilirubin 0.0 -  1.2 mg/dL   1.0   Alkaline Phos 38 - 126 U/L   69   AST 15 - 41 U/L   31   ALT 0 - 44 U/L   7     DG Abd 1 View Result Date: 08/05/2024 EXAM: 1 VIEW XRAY OF THE ABDOMEN 08/05/2024 05:23:00 PM COMPARISON: None available. CLINICAL HISTORY: Encounter for imaging study to confirm nasogastric tube placement. FINDINGS: LINES, TUBES AND DEVICES: Enteric tube in place with tip overlying the distal stomach or proximal duodenum. BOWEL: Nonobstructive bowel gas pattern. SOFT TISSUES: No abnormal calcifications. BONES: No acute fracture. LUNGS: Right lung mass. IMPRESSION: 1. Enteric tube in place with tip overlying the distal stomach or proximal duodenum. 2. Right lung mass. Electronically signed by: Oneil Devonshire MD 08/05/2024 07:48 PM EST RP Workstation: [id number]    ASSESSMENT/PLAN:  Assessment: Principal Problem:   Acute stroke of basal ganglia (HCC) Active Problems:   Hypertension   Hyperlipidemia   Type 2 diabetes mellitus with complication, without long-term current use of insulin  (HCC)   Subdural hematoma (HCC)   Atrial fibrillation (HCC)   Malnutrition of moderate degree   Plan: #Acute stroke of left basal ganglia  Improving, has right facial droop and dysarthric speech. MBS (1/22) showed impaired tongue control and lingual motion causing oral and pharyngeal dysphagia. Unable to tolerate dysphagia level 1 diet; Cortrak placed. Dysphagia secondary to stroke. Not a candidate for inpatient rehab due to lack of 24-hour support. Patient and family agreeable to SNF; awaiting bed. - Neurology signed off, appreciate recs.  - Continue rosuvastatin  20 mg daily  - Hold antiplatelet therapy d/t SDH until repeat CT scan in 2-3 weeks shows near resolution of SDH  - VTE prophylaxis: subcutaneous heparin   - SBP goal < 160, long-term goal of normotension  - Continuous cardiac monitoring  - Outpatient f/u with stroke clinic at [facility] in ~ 4 weeks - PT/OT/SLP eval and treat - Delirium  precautions  #Subdural hematoma #Probable fall on antiplatelet therapy #History of Falls  Stable, no signs of increased ICP on exam. Neurology recommends holding antiplatelet therapy pending repeat SDH imaging in 2-3 weeks.  Holding patient antiplatelet therapy in setting of SDH.  No orthostatic vitals. -Continue treatment as above.   #New-onset atrial fibrillation without RVR  Stable, normal rate. - Continuous cardiac monitoring - Hold  home metoprolol  succinate 50 mg daily  #Hypertension  BP above goal< 160 in the setting of SDH.  Has been getting amlodipine  5 mg daily. - Will resume home benazepril  for better BP control. - Continue holding metoprolol  and HCTZ.  #Type 2 diabetes mellitus, non-insulin  dependent, controlled, complicated by peripheral neuropathy and large vessel disease Stable blood glucose. -Holding home metformin. - CBG TID AC  #Hyperlipidemia Patient has a history of HLD managed with pravastatin. Most recent lipid panel in the hospital showed LDL of 109, significantly above goal of <70, contributing to his acute stroke risk. Transitioned to rosuvastatin  20 mg daily. 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 Patient declined biopsy and treatment, and denies experiencing any pain or difficulty breathing due to COPD or mass. He is not in any respiratory distress and has been oxygenating well on room air. - Home albuterol  inhaler prn - Mucinex  600 mg bid  #PAD (with multiple LE revascularization procedures) #History of NSTEMI (post-procedure) Patient has an extensive history of PAD with multiple lower extremity revascularization procedures as well as post-procedure NSTEMI in 2022. For his PAD, he has been on antiplatelet therapy which has been  held in context of his SDH.  - Hold home antiplatelet therapy per recommendations above  Best Practice: Diet: Tube feeds IVF: Fluids: 0.9NS, Rate: 1000 cc bolus VTE: heparin  injection 5,000 Units Start: 08/04/24 2200 Place and maintain sequential compression device Start: 08/03/24 1848 Code: Full  Disposition planning: Therapy Recs: CIR, DME: none Family Contact: brother and sister-in-law, at bedside. DISPO: Anticipated discharge in 1-2 days to SNF pending insurance auth.  Signature: Zaul Hubers, M.D.  Internal Medicine Resident, PGY-2  9:13 AM, 08/06/2024  "

## 2024-08-06 NOTE — Progress Notes (Signed)
 Physical Therapy Treatment Patient Details Name: Mark Dickerson MRN: [id number] DOB: 1946/03/20 Today's Date: 08/06/2024   History of Present Illness Pt is 79 y/o male presenting on 08/03/24 with slurred speech, R facial droop since 1/19. Found with small volume subdural hematoma along the falx, also with L basal ganglia infarct. Repeat CT stable. PMH includes: DM, HTN, MI, PAD.    PT Comments  Pt progressing towards physical therapy goals. Was able to perform transfers and ambulation with up to min assist and RW for support. Pt continues to have difficulty managing his saliva, and required assist to wipe mouth frequently throughout session. Pt with mild R side inattention, with gaze tendency to L during hallway ambulation. We were able to get pt switched to a portable telemetry box and leave the room for the first time. Pt reports enjoying his time out of the room and was eager and motivated to participate with PT. Will continue to follow.    If plan is discharge home, recommend the following: A little help with walking and/or transfers;A lot of help with bathing/dressing/bathroom;Assistance with cooking/housework;Assist for transportation;Help with stairs or ramp for entrance   Can travel by private vehicle     Yes  Equipment Recommendations  BSC/3in1    Recommendations for Other Services Rehab consult     Precautions / Restrictions Precautions Precautions: Fall;Other (comment) Recall of Precautions/Restrictions: Intact Precaution/Restrictions Comments: dysarthric Restrictions Weight Bearing Restrictions Per Provider Order: No     Mobility  Bed Mobility Overal bed mobility: Needs Assistance Bed Mobility: Supine to Sit, Sit to Supine     Supine to sit: Min assist Sit to supine: Contact guard assist   General bed mobility comments: HOB elevated to sit up. Pt reaching for therapist assist. Encourage pt to use railing, but pt unable to fully elevate trunk to full sitting  position. Min assist to complete. Pt able to return to supine without assistance, however hands on guarding at trunk provided for safety.    Transfers Overall transfer level: Needs assistance Equipment used: Rolling walker (2 wheels) Transfers: Sit to/from Stand Sit to Stand: Min assist, Contact guard assist   Step pivot transfers: Min assist       General transfer comment: Min assist to power up to full stand from low bed height. VC's for hand placement on seated surface for safety.    Ambulation/Gait Ambulation/Gait assistance: Contact guard assist Gait Distance (Feet): 150 Feet (x2 bouts with seated rest break between) Assistive device: Rolling walker (2 wheels) Gait Pattern/deviations: Step-to pattern Gait velocity: Decreased Gait velocity interpretation: <1.31 ft/sec, indicative of household ambulator   General Gait Details: VC's throughout for improved posture, closer walker proximity, and forward gaze. Occasional assist for walker management. Hands on assist throughout for safety.   Stairs             Wheelchair Mobility     Tilt Bed    Modified Rankin (Stroke Patients Only) Modified Rankin (Stroke Patients Only) Pre-Morbid Rankin Score: No symptoms Modified Rankin: Moderately severe disability     Balance Overall balance assessment: Needs assistance Sitting-balance support: No upper extremity supported, Feet supported Sitting balance-Leahy Scale: Fair     Standing balance support: Bilateral upper extremity supported, Reliant on assistive device for balance Standing balance-Leahy Scale: Poor                              Communication Communication Communication: Impaired Factors Affecting Communication: Reduced clarity of  speech  Cognition Arousal: Alert Behavior During Therapy: WFL for tasks assessed/performed   PT - Cognitive impairments: No family/caregiver present to determine baseline, Memory, Safety/Judgement                        PT - Cognition Comments: Followed commands appropriately. Difficult to follow what pt saying at times due to dysarthria and abrupt changes in conversation topic. Following commands: Impaired Following commands impaired: Follows one step commands with increased time    Cueing Cueing Techniques: Verbal cues, Visual cues  Exercises      General Comments        Pertinent Vitals/Pain Pain Assessment Pain Assessment: No/denies pain Faces Pain Scale: No hurt    Home Living                          Prior Function            PT Goals (current goals can now be found in the care plan section) Acute Rehab PT Goals Patient Stated Goal: to get better PT Goal Formulation: With patient Time For Goal Achievement: 08/18/24 Potential to Achieve Goals: Good Progress towards PT goals: Progressing toward goals    Frequency    Min 2X/week      PT Plan      Co-evaluation              AM-PAC PT 6 Clicks Mobility   Outcome Measure  Help needed turning from your back to your side while in a flat bed without using bedrails?: A Little Help needed moving from lying on your back to sitting on the side of a flat bed without using bedrails?: A Little Help needed moving to and from a bed to a chair (including a wheelchair)?: A Little Help needed standing up from a chair using your arms (e.g., wheelchair or bedside chair)?: A Little Help needed to walk in hospital room?: A Little Help needed climbing 3-5 steps with a railing? : A Lot 6 Click Score: 17    End of Session Equipment Utilized During Treatment: Gait belt Activity Tolerance: Patient tolerated treatment well Patient left: in bed;with call bell/phone within reach;with bed alarm set Nurse Communication: Mobility status PT Visit Diagnosis: Other abnormalities of gait and mobility (R26.89)     Time: 8684-8651 PT Time Calculation (min) (ACUTE ONLY): 33 min  Charges:    $Gait Training: 23-37  mins PT General Charges $$ ACUTE PT VISIT: 1 Visit                     Leita Sable, PT, DPT Acute Rehabilitation Services Secure Chat Preferred Office: [phone]    Leita JONETTA Sable 08/06/2024, 2:11 PM

## 2024-08-06 NOTE — Plan of Care (Signed)
 Sat in chair for more than 2 hours, walk in the hallway using front-wheel walker as assisted by physiotherapist. Able to take thick liquid diet through mouth.  Problem: Education: Goal: Knowledge of secondary prevention will improve (MUST DOCUMENT ALL) Outcome: Progressing   Problem: Education: Goal: Knowledge of patient specific risk factors will improve (DELETE if not current risk factor) Outcome: Progressing   Problem: Education: Goal: Knowledge of disease or condition will improve Outcome: Progressing   Problem: Ischemic Stroke/TIA Tissue Perfusion: Goal: Complications of ischemic stroke/TIA will be minimized Outcome: Progressing   Problem: Coping: Goal: Will verbalize positive feelings about self Outcome: Progressing   Problem: Coping: Goal: Will identify appropriate support needs Outcome: Progressing   Problem: Self-Care: Goal: Ability to participate in self-care as condition permits will improve Outcome: Progressing   Problem: Health Behavior/Discharge Planning: Goal: Goals will be collaboratively established with patient/family Outcome: Progressing   Problem: Health Behavior/Discharge Planning: Goal: Ability to manage health-related needs will improve Outcome: Progressing   Problem: Coping: Goal: Will identify appropriate support needs Outcome: Progressing   Problem: Nutrition: Goal: Risk of aspiration will decrease Outcome: Progressing   Problem: Education: Goal: Knowledge of General Education information will improve Description: Including pain rating scale, medication(s)/side effects and non-pharmacologic comfort measures Outcome: Progressing   Problem: Nutrition: Goal: Dietary intake will improve Outcome: Progressing   Problem: Clinical Measurements: Goal: Will remain free from infection Outcome: Progressing   Problem: Clinical Measurements: Goal: Ability to maintain clinical measurements within normal limits will improve Outcome: Progressing    Problem: Nutrition: Goal: Adequate nutrition will be maintained Outcome: Progressing   Problem: Activity: Goal: Risk for activity intolerance will decrease Outcome: Progressing   Problem: Coping: Goal: Level of anxiety will decrease Outcome: Progressing   Problem: Pain Managment: Goal: General experience of comfort will improve and/or be controlled Outcome: Progressing   Problem: Elimination: Goal: Will not experience complications related to urinary retention Outcome: Progressing   Problem: Skin Integrity: Goal: Risk for impaired skin integrity will decrease Outcome: Progressing

## 2024-08-07 DIAGNOSIS — J449 Chronic obstructive pulmonary disease, unspecified: Secondary | ICD-10-CM | POA: Diagnosis not present

## 2024-08-07 DIAGNOSIS — I6200 Nontraumatic subdural hemorrhage, unspecified: Secondary | ICD-10-CM | POA: Diagnosis not present

## 2024-08-07 DIAGNOSIS — I10 Essential (primary) hypertension: Secondary | ICD-10-CM | POA: Diagnosis not present

## 2024-08-07 DIAGNOSIS — I4891 Unspecified atrial fibrillation: Secondary | ICD-10-CM | POA: Diagnosis not present

## 2024-08-07 DIAGNOSIS — I6389 Other cerebral infarction: Secondary | ICD-10-CM | POA: Diagnosis not present

## 2024-08-07 DIAGNOSIS — E1151 Type 2 diabetes mellitus with diabetic peripheral angiopathy without gangrene: Secondary | ICD-10-CM | POA: Diagnosis not present

## 2024-08-07 DIAGNOSIS — E876 Hypokalemia: Secondary | ICD-10-CM | POA: Diagnosis not present

## 2024-08-07 DIAGNOSIS — R918 Other nonspecific abnormal finding of lung field: Secondary | ICD-10-CM | POA: Diagnosis not present

## 2024-08-07 DIAGNOSIS — F17210 Nicotine dependence, cigarettes, uncomplicated: Secondary | ICD-10-CM | POA: Diagnosis not present

## 2024-08-07 DIAGNOSIS — E785 Hyperlipidemia, unspecified: Secondary | ICD-10-CM | POA: Diagnosis not present

## 2024-08-07 DIAGNOSIS — E1142 Type 2 diabetes mellitus with diabetic polyneuropathy: Secondary | ICD-10-CM | POA: Diagnosis not present

## 2024-08-07 DIAGNOSIS — I739 Peripheral vascular disease, unspecified: Secondary | ICD-10-CM | POA: Diagnosis not present

## 2024-08-07 LAB — BASIC METABOLIC PANEL WITH GFR
Anion gap: 13 (ref 5–15)
BUN: 18 mg/dL (ref 8–23)
CO2: 22 mmol/L (ref 22–32)
Calcium: 9.9 mg/dL (ref 8.9–10.3)
Chloride: 103 mmol/L (ref 98–111)
Creatinine, Ser: 0.73 mg/dL (ref 0.61–1.24)
GFR, Estimated: >60 mL/min
Glucose, Bld: 155 mg/dL — ABNORMAL HIGH (ref 70–99)
Potassium: 3.3 mmol/L — ABNORMAL LOW (ref 3.5–5.1)
Sodium: 138 mmol/L (ref 135–145)

## 2024-08-07 LAB — GLUCOSE, CAPILLARY
Glucose-Capillary: 116 mg/dL — ABNORMAL HIGH (ref 70–99)
Glucose-Capillary: 164 mg/dL — ABNORMAL HIGH (ref 70–99)
Glucose-Capillary: 175 mg/dL — ABNORMAL HIGH (ref 70–99)
Glucose-Capillary: 179 mg/dL — ABNORMAL HIGH (ref 70–99)
Glucose-Capillary: 198 mg/dL — ABNORMAL HIGH (ref 70–99)

## 2024-08-07 LAB — CBC
HCT: 44.7 % (ref 39.0–52.0)
Hemoglobin: 15.4 g/dL (ref 13.0–17.0)
MCH: 31.8 pg (ref 26.0–34.0)
MCHC: 34.5 g/dL (ref 30.0–36.0)
MCV: 92.2 fL (ref 80.0–100.0)
Platelets: 191 10*3/uL (ref 150–400)
RBC: 4.85 MIL/uL (ref 4.22–5.81)
RDW: 15.3 % (ref 11.5–15.5)
WBC: 8.4 10*3/uL (ref 4.0–10.5)
nRBC: 0 % (ref 0.0–0.2)

## 2024-08-07 LAB — MAGNESIUM: Magnesium: 2 mg/dL (ref 1.7–2.4)

## 2024-08-07 LAB — PHOSPHORUS: Phosphorus: 3.1 mg/dL (ref 2.5–4.6)

## 2024-08-07 MED ORDER — METOPROLOL SUCCINATE ER 25 MG PO TB24
12.5000 mg | ORAL_TABLET | Freq: Every day | ORAL | Status: DC
  Administered 2024-08-07: 12.5 mg via ORAL
  Filled 2024-08-07 (×2): qty 1

## 2024-08-07 MED ORDER — LABETALOL HCL 5 MG/ML IV SOLN
5.0000 mg | Freq: Once | INTRAVENOUS | Status: AC
  Administered 2024-08-07: 5 mg via INTRAVENOUS
  Filled 2024-08-07: qty 4

## 2024-08-07 MED ORDER — POLYETHYLENE GLYCOL 3350 17 G PO PACK
17.0000 g | PACK | Freq: Two times a day (BID) | ORAL | Status: DC
  Filled 2024-08-07: qty 1

## 2024-08-07 MED ORDER — SORBITOL 70 % SOLN: 15.0000 mL | Freq: Every day | Status: DC | PRN

## 2024-08-07 MED ORDER — POLYETHYLENE GLYCOL 3350 17 G PO PACK
34.0000 g | PACK | Freq: Once | ORAL | Status: AC
  Administered 2024-08-07: 34 g via ORAL
  Filled 2024-08-07: qty 2

## 2024-08-07 MED ORDER — AMLODIPINE BESYLATE 5 MG PO TABS
10.0000 mg | ORAL_TABLET | Freq: Every day | ORAL | Status: DC
  Filled 2024-08-07: qty 2

## 2024-08-07 MED ORDER — POTASSIUM CHLORIDE 20 MEQ PO PACK
40.0000 meq | PACK | ORAL | Status: AC
  Administered 2024-08-07 (×2): 40 meq
  Filled 2024-08-07 (×2): qty 2

## 2024-08-07 MED ORDER — SODIUM CHLORIDE 0.9 % IV BOLUS
1000.0000 mL | Freq: Once | INTRAVENOUS | Status: AC
  Administered 2024-08-07: 1000 mL via INTRAVENOUS

## 2024-08-07 MED ORDER — INSULIN ASPART 100 UNIT/ML IJ SOLN
0.0000 [IU] | Freq: Three times a day (TID) | INTRAMUSCULAR | Status: DC
  Administered 2024-08-07 (×2): 2 [IU] via SUBCUTANEOUS
  Administered 2024-08-08: 1 [IU] via SUBCUTANEOUS
  Administered 2024-08-08 – 2024-08-09 (×4): 2 [IU] via SUBCUTANEOUS
  Administered 2024-08-09 – 2024-08-10 (×2): 1 [IU] via SUBCUTANEOUS
  Administered 2024-08-10 (×2): 2 [IU] via SUBCUTANEOUS
  Administered 2024-08-11 (×2): 1 [IU] via SUBCUTANEOUS
  Administered 2024-08-11: 3 [IU] via SUBCUTANEOUS
  Administered 2024-08-12: 1 [IU] via SUBCUTANEOUS
  Administered 2024-08-12 (×2): 2 [IU] via SUBCUTANEOUS
  Administered 2024-08-13: 1 [IU] via SUBCUTANEOUS
  Administered 2024-08-13 (×2): 2 [IU] via SUBCUTANEOUS
  Administered 2024-08-14: 1 [IU] via SUBCUTANEOUS
  Administered 2024-08-14: 2 [IU] via SUBCUTANEOUS
  Administered 2024-08-15: 1 [IU] via SUBCUTANEOUS
  Administered 2024-08-15 – 2024-08-16 (×2): 2 [IU] via SUBCUTANEOUS
  Administered 2024-08-17: 1 [IU] via SUBCUTANEOUS
  Administered 2024-08-17: 2 [IU] via SUBCUTANEOUS
  Filled 2024-08-07 (×2): qty 1
  Filled 2024-08-07 (×2): qty 2
  Filled 2024-08-07 (×2): qty 1
  Filled 2024-08-07: qty 2
  Filled 2024-08-07: qty 1
  Filled 2024-08-07: qty 2
  Filled 2024-08-07: qty 1
  Filled 2024-08-07 (×2): qty 2
  Filled 2024-08-07: qty 1
  Filled 2024-08-07 (×2): qty 2
  Filled 2024-08-07: qty 3
  Filled 2024-08-07 (×3): qty 2
  Filled 2024-08-07: qty 3
  Filled 2024-08-07: qty 2
  Filled 2024-08-07: qty 1
  Filled 2024-08-07 (×4): qty 2

## 2024-08-07 MED ORDER — LABETALOL HCL 5 MG/ML IV SOLN
5.0000 mg | Freq: Once | INTRAVENOUS | Status: AC
  Administered 2024-08-08: 5 mg via INTRAVENOUS
  Filled 2024-08-07: qty 1
  Filled 2024-08-07 (×2): qty 4

## 2024-08-07 NOTE — Plan of Care (Signed)
" °  Problem: Ischemic Stroke/TIA Tissue Perfusion: Goal: Complications of ischemic stroke/TIA will be minimized Outcome: Progressing   Problem: Nutrition: Goal: Risk of aspiration will decrease Outcome: Progressing Goal: Dietary intake will improve Outcome: Progressing   Problem: Clinical Measurements: Goal: Ability to maintain clinical measurements within normal limits will improve Outcome: Progressing Goal: Will remain free from infection Outcome: Progressing Goal: Diagnostic test results will improve Outcome: Progressing Goal: Respiratory complications will improve Outcome: Progressing Goal: Cardiovascular complication will be avoided Outcome: Progressing   Problem: Activity: Goal: Risk for activity intolerance will decrease Outcome: Progressing   Problem: Elimination: Goal: Will not experience complications related to bowel motility Outcome: Progressing Goal: Will not experience complications related to urinary retention Outcome: Progressing   Problem: Pain Managment: Goal: General experience of comfort will improve and/or be controlled Outcome: Progressing   Problem: Safety: Goal: Ability to remain free from injury will improve Outcome: Progressing   Problem: Skin Integrity: Goal: Risk for impaired skin integrity will decrease Outcome: Progressing   "

## 2024-08-07 NOTE — Progress Notes (Addendum)
 "  HD#4 SUBJECTIVE:  Patient Summary: Mark Dickerson is an independently-living (with family support) 79 y.o. 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   Overnight Events: BP increased to 190s/110s overnight and received IV Labetalol  5 mg once. Pressures returned to goal SBP < 160.  Interim History: Patient evaluated on rounds this AM. Resting comfortably and easily awakened. He reports feeling like he's improving in terms of swallow/speaking and is hopeful that the Dobhoff can be removed to return to oral diet. Denies any current chest pain or shortness of breath. No other acute concerns. States that he was sleeping since 9 PM last night.  OBJECTIVE:  Vital Signs: Vitals:   08/06/24 2254 08/06/24 2350 08/07/24 0349 08/07/24 0500  BP: (!) 184/89 (!) 159/90 (!) 155/79   Pulse: 70 82 69   Resp:   17   Temp:  98 F (36.7 C) 97.7 F (36.5 C)   TempSrc:  Oral Oral   SpO2:  96% 95%   Weight:    80.9 kg  Height:       Supplemental O2: Room Air SpO2: 95 %  Filed Weights   08/03/24 1012 08/06/24 0500 08/07/24 0500  Weight: 81.6 kg 84.4 kg 80.9 kg     Intake/Output Summary (Last 24 hours) at 08/07/2024 0625 Last data filed at 08/07/2024 0200 Gross per 24 hour  Intake --  Output 950 ml  Net -950 ml   Net IO Since Admission: -1,486.67 mL [08/07/24 0625]  Physical Exam: Constitutional: well-appearing, elderly male in no acute distress HENT: normocephalic atraumatic, Dobhoff tube in place via right nare Eyes:  conjunctiva non-erythematous, PERRL, no scleral icterus Cardiovascular: irregularly irregular rhythm with regular rate, no m/r/g Pulmonary/Chest: normal work of breathing on room air, upper airway sounds in anterior lung fields Abdominal: soft, non-tender, non-distended, bowel sounds normal MSK: normal bulk and tone GU: + condom cathter in place Neurological: alert & oriented x3, dysarthria without aphasia; mild right facial droop upon smile, otherwise CN II-XII intact. 5/5 strength noted to upper extremities both proximally and distally. 5/5 strength in BLE proximally + distally.  Skin: warm and dry, mildly reduced skin turgor to forearm + neck Extremities: no edema or cyanosis; peripheral pulses intact; SCDs in place Psych: normal mood and affect, thought content normal   Patient Lines/Drains/Airways Status     Active Line/Drains/Airways     Name Placement date Placement time Site Days   Peripheral IV 08/03/24 20 G Anterior;Right Forearm 08/03/24  1025  Forearm  4   Small Bore Feeding Tube 10 Fr. Left nare Marking at nare/corner of mouth 85 cm 08/05/24  1524  Left nare  2            Pertinent labs and imaging:      Latest Ref Rng & Units 08/06/2024    7:56 AM 08/05/2024    6:50 AM 08/04/2024    8:22 AM  CBC  WBC 4.0 - 10.5 K/uL 7.6  7.8  8.6   Hemoglobin 13.0 - 17.0 g/dL 85.5  85.0  84.3   Hematocrit 39.0 - 52.0 % 41.6  43.5  44.8   Platelets 150 - 400 K/uL 192  174  173        Latest Ref Rng & Units 08/06/2024    7:56 AM 08/05/2024    6:50 AM 08/04/2024    8:22 AM  CMP  Glucose 70 - 99 mg/dL 872  896  885   BUN 8 - 23 mg/dL 19  16  16    Creatinine 0.61 - 1.24 mg/dL 9.18  9.17  8.97   Sodium 135 - 145 mmol/L 136  134  133   Potassium 3.5 - 5.1 mmol/L 3.2  3.8  4.2   Chloride 98 - 111 mmol/L 101  99  94   CO2 22 - 32 mmol/L 23  20  24    Calcium  8.9 - 10.3 mg/dL 9.7  9.6  89.7     No results found.   ASSESSMENT/PLAN:  Assessment: Principal Problem:   Acute  stroke of basal ganglia (HCC) Active Problems:   Hypertension   Hyperlipidemia   Type 2 diabetes mellitus with complication, without long-term current use of insulin  (HCC)   Subdural hematoma (HCC)   Atrial fibrillation (HCC)   Malnutrition of moderate degree   SDH (subdural hematoma) (HCC)   Acute ischemic stroke (HCC)   Plan: #Acute stroke of left basal ganglia w/ severe dysphagia Exam grossly unchanged since yesterday. Symptoms and exam consistent with acute left basal ganglia CVA, also seen on initial MRI. A1c optimized, LDL not at goal so was started on high-intensity statin. Patient continues to be in rate-controlled atrial fibrillation (newly onset this admission) but no definitive embolic source found on TTE. Holding DAPT d/t prior SDH. Will continue with daily neuro exams. PT/OT/SLP continue to work with the patient on his activity levels, swallowing, and speech. After concerns while on dysphagia I diet, patient had Dobhoff placed with Cortrak. PT recommending CIR but patient is not a candidate due to lack of home support, now pursuing SNF. Will need Dobhoff removed prior to discharge. Appreciate SLP assistance. Neurology signed off. - Neurology consulted, appreciate recs  - Continue rosuvastatin  20 mg daily  - Hold antiplatelet therapy d/t SDH until repeat CT scan in 2-3 weeks shows near resolution of SDH  - VTE prophylaxis: subcutaneous heparin   - SBP goal < 160, long-term goal of normotension  - Continuous cardiac monitoring  - Outpatient f/u with stroke clinic at [facility] - RD consulted  - Continue Cortrak  - Appreciate assistance with tube feeds/free water  - 1L IVF - PT/OT/SLP eval and treat - Delirium precautions  #Subdural hematoma #Probable fall on antiplatelet therapy #History of Falls  Patient has had a concerning history of falls over the last 9 months, voiced by the patient's family. They show concern regarding his weight loss and weakness secondary to his suspected  lung cancer. They deny any indications that the patient's falls are mechanical in nature. Most recent fall suspected to be prior to presentation at [facility] while on antiplatelet therapy. No new symptoms indicative of increasing ICP or bleeding. Neurology recommends holding antiplatelet therapy until further imaging of SDH in 2-3 weeks. Orthostatic vitals are pending and we will continue to monitor his progress with therapy. - Neurology consulted, appreciate recs  - Repeat CT scan of the brain in 2-3 weeks to assess SDH - Hold antiplatelet therapy until repeat CT scan shows near resolution of SDH - Orthostatic vitals pending  #New-onset atrial fibrillation without RVR  Patient is in A-fib without RVR. No prior history of A-fib or  anticoagulation. CHA2DS2-VASc of 7. Rates have been within normal limits. Holding AC d/t SDH. We will continue to monitor his rates and, if needed, will give him IV beta blocker. - Continuous cardiac monitoring - Hold home metoprolol  succinate 50 mg daily  #Hypertension  Patient has a history of poorly controlled hypertension presumably managed with amlodipine , metoprolol , benazepril , and hydrochlorothiazide. Neurology recommends SBP <160 with long-term goal of normotension. Patient's pressures have been elevated above systolic goal, ranging from 150s-190s SBP. Will continue holding hydrochlorothiazide while administering IVF and start lower dose of metoprolol  and assess for tolerance with regards to HR.  - Continue home amlodipine  5 mg daily - Continue home benazepril  40 mg daily - Start metoprolol  succinate 12.5 mg daily - Hold home hydrochlorothiazide, continue to monitor pressures  #Type 2 diabetes mellitus, non-insulin  dependent, controlled, complicated by peripheral neuropathy and large vessel disease Patient has diabetes well controlled with metformin 500 mg daily. Most recent A1C in the hospital was A1C and BGLs ranging in the 110s without insulin . Given patient's  control of his diabetes and limited PO intake, will hold sensitive SSI. We will reassess his need for sensitive SSI as his PO intake progresses. - Hold home metformin 500 mg daily - Sensitive SSI  #Hypokalemia K 3.3 this AM despite starting home benazepril  recently. Likely in setting of poor oral intake prior to Dobhoff insertion. - Replete with 40 mEq x 2 today - Trend BMPs  #Hyperlipidemia Patient has a history of HLD managed with pravastatin. Most recent lipid panel in the hospital showed LDL of 109, significantly above goal of <70, contributing to his acute stroke risk. Transitioned to rosuvastatin  20 mg daily. 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 Patient declined biopsy and treatment, and denies experiencing any pain or difficulty breathing due to COPD or mass. He is not in any respiratory distress and has been oxygenating well on room air. - Home albuterol  inhaler prn - Robitussin 5 ml q4h PRN for cough/loosen phlegm  #PAD (with multiple LE revascularization procedures) #History of NSTEMI (post-procedure) Patient has an extensive history of PAD with multiple lower extremity revascularization procedures as well as post-procedure NSTEMI in 2022. For his PAD, he has been on antiplatelet therapy which has been held in context of his SDH.  - Hold home antiplatelet therapy per recommendations above  Best Practice: Diet: NPO (Dobhoff in place) IVF: Fluids: 0.9NS, Rate: 1000 cc bolus VTE: heparin  injection 5,000 Units Start: 08/04/24 2200 Place and maintain sequential compression device Start: 08/03/24 1848 Code: Full  Disposition planning: Therapy Recs: CIR, DME: none Family Contact: brother and sister-in-law, at bedside. DISPO: Anticipated discharge in 1-2  days to SNF pending insurance auth and removal of Dobhoff.  Signature:  Letha Cheadle, MD Internal Medicine Resident  PGY-1 6:25 AM, 08/07/2024  On Call pager [phone]  "

## 2024-08-08 DIAGNOSIS — Z720 Tobacco use: Secondary | ICD-10-CM

## 2024-08-08 DIAGNOSIS — I4891 Unspecified atrial fibrillation: Secondary | ICD-10-CM | POA: Diagnosis not present

## 2024-08-08 DIAGNOSIS — K5900 Constipation, unspecified: Secondary | ICD-10-CM

## 2024-08-08 DIAGNOSIS — I10 Essential (primary) hypertension: Secondary | ICD-10-CM | POA: Diagnosis not present

## 2024-08-08 DIAGNOSIS — E1142 Type 2 diabetes mellitus with diabetic polyneuropathy: Secondary | ICD-10-CM | POA: Diagnosis not present

## 2024-08-08 DIAGNOSIS — E876 Hypokalemia: Secondary | ICD-10-CM | POA: Diagnosis not present

## 2024-08-08 DIAGNOSIS — R918 Other nonspecific abnormal finding of lung field: Secondary | ICD-10-CM | POA: Diagnosis not present

## 2024-08-08 DIAGNOSIS — R1310 Dysphagia, unspecified: Secondary | ICD-10-CM

## 2024-08-08 DIAGNOSIS — I6200 Nontraumatic subdural hemorrhage, unspecified: Secondary | ICD-10-CM | POA: Diagnosis not present

## 2024-08-08 DIAGNOSIS — E785 Hyperlipidemia, unspecified: Secondary | ICD-10-CM | POA: Diagnosis not present

## 2024-08-08 DIAGNOSIS — J449 Chronic obstructive pulmonary disease, unspecified: Secondary | ICD-10-CM | POA: Diagnosis not present

## 2024-08-08 DIAGNOSIS — I69391 Dysphagia following cerebral infarction: Secondary | ICD-10-CM | POA: Diagnosis not present

## 2024-08-08 LAB — BASIC METABOLIC PANEL WITH GFR
Anion gap: 11 (ref 5–15)
BUN: 18 mg/dL (ref 8–23)
CO2: 24 mmol/L (ref 22–32)
Calcium: 9.9 mg/dL (ref 8.9–10.3)
Chloride: 102 mmol/L (ref 98–111)
Creatinine, Ser: 0.85 mg/dL (ref 0.61–1.24)
GFR, Estimated: >60 mL/min
Glucose, Bld: 180 mg/dL — ABNORMAL HIGH (ref 70–99)
Potassium: 3.7 mmol/L (ref 3.5–5.1)
Sodium: 137 mmol/L (ref 135–145)

## 2024-08-08 LAB — GLUCOSE, CAPILLARY
Glucose-Capillary: 144 mg/dL — ABNORMAL HIGH (ref 70–99)
Glucose-Capillary: 151 mg/dL — ABNORMAL HIGH (ref 70–99)
Glucose-Capillary: 158 mg/dL — ABNORMAL HIGH (ref 70–99)
Glucose-Capillary: 165 mg/dL — ABNORMAL HIGH (ref 70–99)
Glucose-Capillary: 165 mg/dL — ABNORMAL HIGH (ref 70–99)
Glucose-Capillary: 198 mg/dL — ABNORMAL HIGH (ref 70–99)

## 2024-08-08 LAB — MAGNESIUM: Magnesium: 2 mg/dL (ref 1.7–2.4)

## 2024-08-08 LAB — PHOSPHORUS: Phosphorus: 2.7 mg/dL (ref 2.5–4.6)

## 2024-08-08 MED ORDER — ROSUVASTATIN CALCIUM 20 MG PO TABS
20.0000 mg | ORAL_TABLET | Freq: Every day | ORAL | Status: DC
  Administered 2024-08-08 – 2024-08-12 (×5): 20 mg
  Filled 2024-08-08 (×4): qty 1

## 2024-08-08 MED ORDER — GUAIFENESIN 100 MG/5ML PO LIQD
5.0000 mL | ORAL | Status: DC | PRN
  Administered 2024-08-08 – 2024-08-09 (×2): 5 mL
  Filled 2024-08-08 (×2): qty 5

## 2024-08-08 MED ORDER — BENAZEPRIL HCL 20 MG PO TABS
40.0000 mg | ORAL_TABLET | Freq: Every day | ORAL | Status: DC
  Administered 2024-08-08 – 2024-08-12 (×5): 40 mg
  Filled 2024-08-08 (×5): qty 2

## 2024-08-08 MED ORDER — LABETALOL HCL 5 MG/ML IV SOLN
5.0000 mg | INTRAVENOUS | Status: DC | PRN
  Administered 2024-08-08 – 2024-08-09 (×2): 5 mg via INTRAVENOUS

## 2024-08-08 MED ORDER — METOPROLOL TARTRATE 25 MG/10 ML ORAL SUSPENSION
6.2500 mg | Freq: Two times a day (BID) | ORAL | Status: DC
  Administered 2024-08-08 – 2024-08-10 (×5): 6.25 mg
  Filled 2024-08-08 (×6): qty 5

## 2024-08-08 MED ORDER — POLYETHYLENE GLYCOL 3350 17 G PO PACK
17.0000 g | PACK | Freq: Two times a day (BID) | ORAL | Status: DC
  Administered 2024-08-09 – 2024-08-12 (×5): 17 g
  Filled 2024-08-08 (×6): qty 1

## 2024-08-08 MED ORDER — AMLODIPINE BESYLATE 5 MG PO TABS
10.0000 mg | ORAL_TABLET | Freq: Every day | ORAL | Status: DC
  Administered 2024-08-08 – 2024-08-12 (×5): 10 mg
  Filled 2024-08-08 (×4): qty 2

## 2024-08-08 MED ORDER — FREE WATER
200.0000 mL | Freq: Four times a day (QID) | Status: DC
  Administered 2024-08-08 – 2024-08-10 (×8): 200 mL

## 2024-08-08 NOTE — Plan of Care (Addendum)
 He sleeps often but awake upon name being called. SBP often elevated, PRN medication given.   Problem: Education: Goal: Knowledge of disease or condition will improve Outcome: Progressing   Problem: Education: Goal: Knowledge of General Education information will improve Description: Including pain rating scale, medication(s)/side effects and non-pharmacologic comfort measures Outcome: Progressing   Problem: Coping: Goal: Level of anxiety will decrease Outcome: Progressing   Problem: Skin Integrity: Goal: Risk for impaired skin integrity will decrease Outcome: Progressing   Problem: Nutritional: Goal: Maintenance of adequate nutrition will improve Outcome: Progressing

## 2024-08-08 NOTE — Plan of Care (Signed)
  Problem: Education: Goal: Knowledge of disease or condition will improve Outcome: Progressing Goal: Knowledge of secondary prevention will improve (MUST DOCUMENT ALL) Outcome: Progressing Goal: Knowledge of patient specific risk factors will improve (DELETE if not current risk factor) Outcome: Progressing   Problem: Ischemic Stroke/TIA Tissue Perfusion: Goal: Complications of ischemic stroke/TIA will be minimized Outcome: Progressing   Problem: Education: Goal: Knowledge of General Education information will improve Description: Including pain rating scale, medication(s)/side effects and non-pharmacologic comfort measures Outcome: Progressing

## 2024-08-08 NOTE — TOC Progression Note (Signed)
 Transition of Care [facility]) - Progression Note    Patient Details  Name: Mark Dickerson MRN: [id number] Date of Birth: Jul 11, 1946  Transition [facility]) CM/SW Contact  Luise JAYSON Pan, [place] Phone Number: 08/08/2024, 9:38 AM  Clinical Narrative:   [place]onna to SNF placement: Cortrak. CSW to continue SNF placement search once cortak is removed and patient improves.   CSW will continue to follow.    Expected Discharge Plan: Skilled Nursing Facility Barriers to Discharge: Continued Medical Work up, English As A Second Language Teacher               Expected Discharge Plan and Services   Discharge Planning Services: CM Consult Post Acute Care Choice: IP Rehab Living arrangements for the past 2 months: Single Family Home                                       Social Drivers of Health (SDOH) Interventions SDOH Screenings   Food Insecurity: Patient Declined (08/04/2024)  Housing: Unknown (08/04/2024)  Transportation Needs: Patient Declined (08/04/2024)  Utilities: Patient Declined (08/04/2024)  Social Connections: Patient Declined (08/04/2024)  Tobacco Use: High Risk (09/17/2023)   Received from [facility]    Readmission Risk Interventions     No data to display

## 2024-08-08 NOTE — Progress Notes (Signed)
 Speech Language Pathology Treatment: Dysphagia  Patient Details Name: Mark Dickerson MRN: [id number] DOB: [birth year] Today's Date: 08/08/2024 Time: 8682-8666 SLP Time Calculation (min) (ACUTE ONLY): 16 min  Assessment / Plan / Recommendation Clinical Impression  Pt has been NPO this weekend with Cortrak in place. SLP provided trials of consistencies recommended from [facility] last week to see if diet could be resumed. Pt consumed 4oz of honey thick liquids and at least half of a cup of applesauce with no overt s/s of aspiration. Last week he was coughing more consistently during PO trials and during meals, so this appears to be improvement in that it is a noticeable reduction in s/s of aspiration. He also seems to have less R pocketing during PO trials, but there was some baseline pooling of saliva with anterior loss.   PLAN: Recommend resuming PO diet of Dys 1 (puree) solids and honey thick liquids by cup or spoon (no straws) with supervision. Would consider leaving in Cortrak for a little longer, given that pt had difficulty starting a diet at the end of last week, so we may want to see if he can tolerate this over the course of entire meal trays.    HPI HPI: Pt is a 79 y/o male presenting on 08/03/24 with slurred speech, R facial droop, and difficulty swallowing since 1/19. Found with small volume subdural hematoma along the falx, also with L basal ganglia infarct. Repeat CT stable. 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      SLP Plan  Continue with current plan of care        Swallow Evaluation Recommendations   Recommendations: PO diet PO Diet Recommendation: Dysphagia 1 (Pureed);Moderately thick liquids (Level 3, honey thick) Liquid Administration via: Cup;Spoon;No straw Medication Administration: Crushed with puree Supervision: Staff to assist with self-feeding;Full supervision/cueing for swallowing strategies Postural  changes: Position pt fully upright for meals;Stay upright 30-60 min after meals Oral care recommendations: Oral care BID (2x/day) Caregiver Recommendations: Avoid jello, ice cream, thin soups, popsicles;Remove water  pitcher;Have oral suction available     Recommendations         Dysphagia, oropharyngeal phase (R13.12)     Continue with current plan of care     Leita SAILOR., M.A. CCC-SLP Acute Rehabilitation Services Office: [phone]  Secure chat preferred   08/08/2024, 2:04 PM

## 2024-08-08 NOTE — Progress Notes (Addendum)
 "  HD#5 SUBJECTIVE:  Patient Summary: Mark Dickerson is an independently-living (with family support) 79 y.o. 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   Overnight Events: BP increased to 190s/110s overnight and received IV Labetalol  5 mg once. Pressures returned to goal SBP < 160.  Interim History: Patient resting comfortably in bed.  He feels like he is improving compared to yesterday in terms of speech and swallowing function.  He denies any sort of chest pain, shortness of breath, and also denies any weakness in his arms/legs compared to yesterday.  I discussed with him the options for nutrition moving forward.  Explained that we will have our SLP evaluate him to see if he could possibly switch back to dysphagia diet.  Did mention the possibility of a PEG tube if he is not able to swallow safely.  The patient mentioned that he would like physical therapy to come back and work with him.  He would like to get out of bed and have the SCDs removed from his legs.  Reports that he had several bowel movements yesterday, did not want such strong laxatives today.  OBJECTIVE:  Vital Signs: Vitals:   08/08/24 0108 08/08/24 0402 08/08/24 0402 08/08/24 0800  BP: 138/62  (!) 148/74 (!) 159/83  Pulse:   76 78  Resp:   17 16  Temp:   98.3 F (36.8 C) 97.7 F (36.5 C)  TempSrc:   Oral Oral  SpO2:   94% 96%  Weight:  81.8 kg    Height:       Supplemental O2: Room Air SpO2: 96 %  Filed Weights   08/06/24 0500 08/07/24 0500  08/08/24 0402  Weight: 84.4 kg 80.9 kg 81.8 kg     Intake/Output Summary (Last 24 hours) at 08/08/2024 1052 Last data filed at 08/08/2024 0217 Gross per 24 hour  Intake 1942.5 ml  Output 1900 ml  Net 42.5 ml   Net IO Since Admission: -1,284.17 mL [08/08/24 1052]  Physical Exam: Constitutional: well-appearing, elderly male in no acute distress HENT: normocephalic atraumatic, Dobhoff tube in place via right nare Eyes: conjunctiva non-erythematous, PERRL, no scleral icterus Cardiovascular: irregularly irregular rhythm with regular rate, no m/r/g Pulmonary/Chest: normal work of breathing on room air, upper airway sounds in anterior lung fields Abdominal: soft, non-tender, non-distended, bowel sounds normal MSK: normal bulk and tone GU: + condom cathter in place Neurological: alert & oriented x3, dysarthria without aphasia, improved vs. yesterday; mild right facial droop upon smile, otherwise CN II-XII intact.  Moving upper and lower extremity spontaneously and equally. Skin: warm and dry Extremities: no edema or cyanosis; peripheral pulses intact; SCDs in place Psych: normal mood and affect, thought content normal   Patient Lines/Drains/Airways Status     Active Line/Drains/Airways     Name Placement date Placement time Site Days   Peripheral IV 08/03/24 20 G Anterior;Right Forearm 08/03/24  1025  Forearm  5   External Urinary Catheter 08/07/24  1957  --  1   Small Bore Feeding Tube 10 Fr. Left nare Marking at nare/corner of mouth 85 cm 08/05/24  1524  Left nare  3            Pertinent labs and imaging:      Latest Ref Rng & Units 08/07/2024    7:40 AM 08/06/2024    7:56 AM 08/05/2024    6:50 AM  CBC  WBC 4.0 - 10.5 K/uL 8.4  7.6  7.8   Hemoglobin 13.0 - 17.0 g/dL 84.5  85.5  85.0   Hematocrit 39.0 - 52.0 % 44.7  41.6  43.5   Platelets 150 - 400 K/uL 191  192  174        Latest Ref Rng & Units 08/08/2024    1:27 AM 08/07/2024    7:40 AM 08/06/2024    7:56 AM  CMP   Glucose 70 - 99 mg/dL 819  844  872   BUN 8 - 23 mg/dL 18  18  19    Creatinine 0.61 - 1.24 mg/dL 9.14  9.26  9.18   Sodium 135 - 145 mmol/L 137  138  136   Potassium 3.5 - 5.1 mmol/L 3.7  3.3  3.2   Chloride 98 - 111 mmol/L 102  103  101   CO2 22 - 32 mmol/L 24  22  23    Calcium  8.9 - 10.3 mg/dL 9.9  9.9  9.7     No results found.   ASSESSMENT/PLAN:  Assessment: Principal Problem:   Acute stroke of basal ganglia (HCC) Active Problems:   Hypertension   Hyperlipidemia   Type 2 diabetes mellitus with complication, without long-term current use of insulin  (HCC)   Subdural hematoma (HCC)   Atrial fibrillation (HCC)   Malnutrition of moderate degree   SDH (subdural hematoma) (HCC)   Acute ischemic stroke (HCC)   Plan: #Acute stroke of left basal ganglia w/ severe dysphagia Exam with slight improvement to speech function and facial droop compared to yesterday.  Patient had acute infarct to left basal ganglia which was seen on initial MRI. A1c optimized, LDL not at goal so was started on high-intensity statin. Patient continues to be in rate-controlled atrial fibrillation (newly onset this admission) but no definitive embolic source found on TTE. Holding DAPT d/t prior SDH. Will continue with daily neuro exams. PT/OT/SLP continue to work with the patient on his activity levels, swallowing, and speech. After concerns while on dysphagia I diet, patient had Dobhoff placed with Cortrak. PT recommending CIR but patient is not a candidate due to lack of 24/7 support, now pursuing SNF. Will need Dobhoff removed prior to discharge. Appreciate SLP assistance. Neurology signed off. - Neurology consulted, appreciate recs  - Continue rosuvastatin  20 mg daily  - Hold antiplatelet therapy d/t SDH until repeat CT scan in 2-3 weeks (2/5-2/12) shows near resolution of SDH  - VTE prophylaxis: subcutaneous heparin   - SBP goal < 160, long-term goal of normotension  - Continuous cardiac monitoring  -  Outpatient f/u with stroke clinic at [facility] - RD consulted  - Continue Cortrak  - Appreciate assistance with tube feeds/free water  - PT/OT/SLP eval and treat - Delirium precautions  #Subdural hematoma #Probable fall on antiplatelet therapy #History of Falls  Patient has had a concerning history of falls over the last 9 months, voiced by the patient's family. They show concern regarding his weight loss and weakness secondary to his suspected lung cancer. They deny any indications that the patient's falls are mechanical in nature. Most recent fall suspected to be prior to presentation at [facility] while on antiplatelet therapy. No new  symptoms indicative of increasing ICP or bleeding. Neurology recommends holding antiplatelet therapy until further imaging of SDH in 2-3 weeks. Orthostatic vitals are pending and we will continue to monitor his progress with therapy. - Neurology consulted, appreciate recs  - Repeat CT scan of the brain in 2-3 weeks to assess SDH (around 2/5-2/12) - Hold antiplatelet therapy until repeat CT scan shows near resolution of SDH - Orthostatic vitals pending  #New-onset atrial fibrillation without RVR  Patient is in A-fib without RVR. No prior history of A-fib or anticoagulation. CHA2DS2-VASc of 7. Rates have been within normal limits. Holding AC d/t SDH. We will continue to monitor his rates and, if needed, will give him IV beta blocker. - Continuous cardiac monitoring - Hold home metoprolol  succinate 50 mg daily  #Hypertension  Patient has a history of poorly controlled hypertension presumably managed with amlodipine , metoprolol , benazepril , and hydrochlorothiazide. Neurology recommends SBP <160 with long-term goal of normotension. Patient's pressures have been elevated above systolic goal, ranging from 150s-190s SBP. Will continue holding hydrochlorothiazide. - Increase amlodipine  to 10 mg daily - Continue home benazepril  40 mg daily - Switch to metoprolol  tartrate 12.5 mg  twice daily - Hold home hydrochlorothiazide  #Type 2 diabetes mellitus, non-insulin  dependent, controlled, complicated by peripheral neuropathy and large vessel disease Patient has diabetes well controlled with metformin 500 mg daily. Most recent A1C in the hospital was A1C and BGLs ranging in the 110s without insulin . Given patient's control of his diabetes and limited PO intake, will hold sensitive SSI. We will reassess his need for sensitive SSI as his PO intake progresses. - Hold home metformin 500 mg daily - Sensitive SSI  #Hypokalemia K 3.7 this AM. Prior hypokalemia may have been d/t poor oral intake. Dobhoff now in place, will continue monitoring. - Trend BMPs  #Hyperlipidemia Patient has a history of HLD managed with pravastatin. Most recent lipid panel in the hospital showed LDL of 109, significantly above goal of <70, contributing to his acute stroke risk. Transitioned to rosuvastatin  20 mg daily. 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 Patient declined biopsy and treatment, and denies experiencing any pain or difficulty breathing due to COPD or mass. He is not in any respiratory distress and has been oxygenating well on room air. - Home albuterol  inhaler prn - Robitussin 5 ml q4h PRN for cough/loosen phlegm  #PAD (with multiple LE revascularization procedures) #History of NSTEMI (post-procedure) Patient has an extensive history of PAD with multiple lower extremity revascularization procedures as well as post-procedure NSTEMI in 2022. For his PAD, he has been on antiplatelet therapy which has been held in context of his SDH.  - Hold home antiplatelet therapy per recommendations above  #Constipation Had several BMs yesterday after being constipated for few  days.  Will continue bowel regimen. - MiraLAX  17 g per tube twice daily Sorbitol  70% solution 15 mL per tube daily as needed  Best Practice: Diet: NPO (Dobhoff in place) IVF: Fluids: 0.9NS, Rate: 1000 cc bolus VTE: heparin  injection 5,000 Units Start: 08/04/24 2200 Place and maintain sequential compression device Start: 08/03/24 1848 Code: Full  Disposition planning: Therapy Recs: CIR, DME: none Family Contact: brother and sister-in-law, to be notified. DISPO: Anticipated discharge in 1-2 days to SNF pending insurance auth and removal of Dobhoff.  Signature:  Emerly Prak,  MD Internal Medicine Resident  PGY-1 10:52 AM, 08/08/2024  On Call pager [phone]  "

## 2024-08-09 DIAGNOSIS — I10 Essential (primary) hypertension: Secondary | ICD-10-CM | POA: Diagnosis not present

## 2024-08-09 DIAGNOSIS — I69391 Dysphagia following cerebral infarction: Secondary | ICD-10-CM | POA: Diagnosis not present

## 2024-08-09 DIAGNOSIS — I6200 Nontraumatic subdural hemorrhage, unspecified: Secondary | ICD-10-CM | POA: Diagnosis not present

## 2024-08-09 DIAGNOSIS — E871 Hypo-osmolality and hyponatremia: Secondary | ICD-10-CM

## 2024-08-09 DIAGNOSIS — R1310 Dysphagia, unspecified: Secondary | ICD-10-CM | POA: Diagnosis not present

## 2024-08-09 DIAGNOSIS — I4891 Unspecified atrial fibrillation: Secondary | ICD-10-CM | POA: Diagnosis not present

## 2024-08-09 DIAGNOSIS — E1142 Type 2 diabetes mellitus with diabetic polyneuropathy: Secondary | ICD-10-CM | POA: Diagnosis not present

## 2024-08-09 DIAGNOSIS — Z720 Tobacco use: Secondary | ICD-10-CM | POA: Diagnosis not present

## 2024-08-09 DIAGNOSIS — E785 Hyperlipidemia, unspecified: Secondary | ICD-10-CM | POA: Diagnosis not present

## 2024-08-09 DIAGNOSIS — R918 Other nonspecific abnormal finding of lung field: Secondary | ICD-10-CM | POA: Diagnosis not present

## 2024-08-09 DIAGNOSIS — J449 Chronic obstructive pulmonary disease, unspecified: Secondary | ICD-10-CM | POA: Diagnosis not present

## 2024-08-09 DIAGNOSIS — K5900 Constipation, unspecified: Secondary | ICD-10-CM | POA: Diagnosis not present

## 2024-08-09 LAB — BASIC METABOLIC PANEL WITH GFR
Anion gap: 11 (ref 5–15)
BUN: 19 mg/dL (ref 8–23)
CO2: 25 mmol/L (ref 22–32)
Calcium: 10 mg/dL (ref 8.9–10.3)
Chloride: 98 mmol/L (ref 98–111)
Creatinine, Ser: 0.82 mg/dL (ref 0.61–1.24)
GFR, Estimated: >60 mL/min
Glucose, Bld: 141 mg/dL — ABNORMAL HIGH (ref 70–99)
Potassium: 3.8 mmol/L (ref 3.5–5.1)
Sodium: 134 mmol/L — ABNORMAL LOW (ref 135–145)

## 2024-08-09 LAB — GLUCOSE, CAPILLARY
Glucose-Capillary: 177 mg/dL — ABNORMAL HIGH (ref 70–99)
Glucose-Capillary: 173 mg/dL — ABNORMAL HIGH (ref 70–99)
Glucose-Capillary: 177 mg/dL — ABNORMAL HIGH (ref 70–99)
Glucose-Capillary: 140 mg/dL — ABNORMAL HIGH (ref 70–99)
Glucose-Capillary: 174 mg/dL — ABNORMAL HIGH (ref 70–99)

## 2024-08-09 LAB — MAGNESIUM: Magnesium: 2.1 mg/dL (ref 1.7–2.4)

## 2024-08-09 LAB — PHOSPHORUS: Phosphorus: 4.2 mg/dL (ref 2.5–4.6)

## 2024-08-09 MED ORDER — MELATONIN 3 MG PO TABS
3.0000 mg | ORAL_TABLET | Freq: Every day | ORAL | Status: DC
  Administered 2024-08-09 – 2024-08-16 (×8): 3 mg via ORAL
  Filled 2024-08-09 (×8): qty 1

## 2024-08-09 MED ORDER — ENOXAPARIN SODIUM 40 MG/0.4ML IJ SOSY
40.0000 mg | PREFILLED_SYRINGE | INTRAMUSCULAR | Status: DC
  Administered 2024-08-09 – 2024-08-11 (×3): 40 mg via SUBCUTANEOUS
  Filled 2024-08-09 (×3): qty 0.4

## 2024-08-09 MED ORDER — OSMOLITE 1.5 CAL PO LIQD
1000.0000 mL | ORAL | Status: DC
  Administered 2024-08-09 – 2024-08-11 (×3): 1000 mL
  Filled 2024-08-09: qty 1000

## 2024-08-09 NOTE — Progress Notes (Signed)
 Occupational Therapy Treatment Patient Details Name: Mark Dickerson MRN: [id number] DOB: 07-12-1946 Today's Date: 08/09/2024   History of present illness Pt is 79 y/o male presenting on 08/03/24 with slurred speech, R facial droop since 1/19. Found with small volume subdural hematoma along the falx, also with L basal ganglia infarct. Repeat CT stable. PMH includes: DM, HTN, MI, PAD.   OT comments  This 79 yo male seen today to work on toileting transfers and grooming. He is progressing towards goals with being at [facility] when up on his feet with RW to bathroom and to stand to wash hands at sink. He did need some VC's to maneuver the RW at times. He will continue to benefit from acute OT with follow up from continued inpatient follow up therapy, <3 hours/day.       If plan is discharge home, recommend the following:  A little help with walking and/or transfers;A lot of help with bathing/dressing/bathroom;Assistance with cooking/housework;Help with stairs or ramp for entrance;Assist for transportation;Direct supervision/assist for financial management;Direct supervision/assist for medications management   Equipment Recommendations  Other (comment) (TBD next venue)       Precautions / Restrictions Precautions Precautions: Fall Recall of Precautions/Restrictions: Intact Restrictions Weight Bearing Restrictions Per Provider Order: No       Mobility Bed Mobility Overal bed mobility: Needs Assistance Bed Mobility: Supine to Sit, Sit to Supine     Supine to sit: Mod assist, HOB elevated, Used rails Sit to supine: Contact guard assist, HOB elevated, Used rails        Transfers Overall transfer level: Needs assistance Equipment used: Rolling walker (2 wheels) Transfers: Sit to/from Stand Sit to Stand: Min assist                 Balance Overall balance assessment: Needs assistance Sitting-balance support: No upper extremity supported, Feet supported Sitting balance-Leahy  Scale: Fair     Standing balance support: No upper extremity supported, During functional activity Standing balance-Leahy Scale: Fair Standing balance comment: standing at sink to wash hands                           ADL either performed or assessed with clinical judgement   ADL Overall ADL's : Needs assistance/impaired     Grooming: Wash/dry hands;Contact guard assist;Standing                   Toilet Transfer: Contact guard assist;Ambulation;Rolling walker (2 wheels) Toilet Transfer Details (indicate cue type and reason): rolled RW over toilet to stand to urinate Toileting- Clothing Manipulation and Hygiene: Minimal assistance Toileting - Clothing Manipulation Details (indicate cue type and reason): in standing for clothing            Extremity/Trunk Assessment Upper Extremity Assessment Upper Extremity Assessment: Generalized weakness;Right hand dominant;RUE deficits/detail RUE Deficits / Details: Decreased coordination but able to use this UE functionally RUE Coordination: decreased gross motor            Vision Baseline Vision/History: 1 Wears glasses Ability to See in Adequate Light: 0 Adequate Patient Visual Report: No change from baseline Additional Comments: Pt continues with R inattention         Communication Communication Communication: Impaired Factors Affecting Communication: Reduced clarity of speech   Cognition Arousal: Alert Behavior During Therapy: [facility] for tasks assessed/performed  Following commands: Impaired Following commands impaired: Follows one step commands with increased time      Cueing   Cueing Techniques: Verbal cues, Visual cues             Pertinent Vitals/ Pain       Pain Assessment Pain Assessment: No/denies pain         Frequency  Min 2X/week        Progress Toward Goals  OT Goals(current goals can now be found in the care plan section)   Progress towards OT goals: Progressing toward goals  Acute Rehab OT Goals Patient Stated Goal: to get back to taking care of myself OT Goal Formulation: With patient Time For Goal Achievement: 08/19/24 Potential to Achieve Goals: Good         AM-PAC OT 6 Clicks Daily Activity     Outcome Measure   Help from another person eating meals?: A Little Help from another person taking care of personal grooming?: A Little Help from another person toileting, which includes using toliet, bedpan, or urinal?: A Lot Help from another person bathing (including washing, rinsing, drying)?: A Lot Help from another person to put on and taking off regular upper body clothing?: A Little Help from another person to put on and taking off regular lower body clothing?: A Lot 6 Click Score: 15    End of Session Equipment Utilized During Treatment: Gait belt;Rolling walker (2 wheels)  OT Visit Diagnosis: Unsteadiness on feet (R26.81);Other abnormalities of gait and mobility (R26.89);Muscle weakness (generalized) (M62.81)   Activity Tolerance Patient tolerated treatment well   Patient Left in bed;with call bell/phone within reach;with bed alarm set   Nurse Communication  (NT--pt needs primo fit on)        Time: 1424-1440 OT Time Calculation (min): 16 min  Charges: OT General Charges $OT Visit: 1 Visit OT Treatments $Self Care/Home Management : 8-22 mins  Mark Dickerson OT Acute Rehabilitation Services Office [phone]    Mark Dickerson 08/09/2024, 4:21 PM

## 2024-08-09 NOTE — Progress Notes (Signed)
 Physical Therapy Treatment Patient Details Name: Mark Dickerson MRN: [id number] DOB: 02-Jul-1946 Today's Date: 08/09/2024   History of Present Illness Pt is 79 y/o male presenting on 08/03/24 with slurred speech, R facial droop since 1/19. Found with small volume subdural hematoma along the falx, also with L basal ganglia infarct. Repeat CT stable. PMH includes: DM, HTN, MI, PAD.    PT Comments  Pt tolerated treatment well today. Pt able to progress ambulation in hallway with RW CGA/Min A. Constant cues for safety with RW. Pt noted with difficulty path finding back to room. No change in DC/DME recs at this time. PT will continue to follow.     If plan is discharge home, recommend the following: A little help with walking and/or transfers;A lot of help with bathing/dressing/bathroom;Assistance with cooking/housework;Assist for transportation;Help with stairs or ramp for entrance   Can travel by private vehicle     Yes  Equipment Recommendations  BSC/3in1    Recommendations for Other Services       Precautions / Restrictions Precautions Precautions: Fall Recall of Precautions/Restrictions: Intact Precaution/Restrictions Comments: dysarthric Restrictions Weight Bearing Restrictions Per Provider Order: No     Mobility  Bed Mobility Overal bed mobility: Needs Assistance Bed Mobility: Supine to Sit, Sit to Supine     Supine to sit: Mod assist Sit to supine: Contact guard assist   General bed mobility comments: Mod A to elevate trunk and scoot hips forwards via HHA and bed pads.    Transfers Overall transfer level: Needs assistance Equipment used: Rolling walker (2 wheels) Transfers: Sit to/from Stand Sit to Stand: Mod assist, Min assist           General transfer comment: Mod A on first sit to stand from low surface and Min A for subsequent sit to stands. Cues for hand placement as pt wanted to pull up on RW.    Ambulation/Gait Ambulation/Gait assistance: Contact  guard assist, +2 safety/equipment, Min assist (Chair follow) Gait Distance (Feet): 150 Feet Assistive device: Rolling walker (2 wheels) Gait Pattern/deviations: Step-to pattern Gait velocity: Decreased Gait velocity interpretation: <1.8 ft/sec, indicate of risk for recurrent falls   General Gait Details: Constant cues for proximity to RW with occasional hands on assist for RW management. Chair follow provided with 1 seated rest break as cortrak began to leak on the floor.   Stairs             Wheelchair Mobility     Tilt Bed    Modified Rankin (Stroke Patients Only) Modified Rankin (Stroke Patients Only) Pre-Morbid Rankin Score: No symptoms Modified Rankin: Moderately severe disability     Balance Overall balance assessment: Needs assistance Sitting-balance support: No upper extremity supported, Feet supported Sitting balance-Leahy Scale: Fair     Standing balance support: Bilateral upper extremity supported, During functional activity Standing balance-Leahy Scale: Poor Standing balance comment: Reliant on RW                            Communication Communication Communication: Impaired Factors Affecting Communication: Reduced clarity of speech  Cognition Arousal: Alert Behavior During Therapy: WFL for tasks assessed/performed   PT - Cognitive impairments: No family/caregiver present to determine baseline, Memory, Safety/Judgement                       PT - Cognition Comments: Followed commands appropriately. Difficult to follow what pt saying at times due to dysarthria and abrupt changes  in conversation topic. Following commands: Impaired Following commands impaired: Follows one step commands with increased time    Cueing Cueing Techniques: Verbal cues, Visual cues  Exercises      General Comments General comments (skin integrity, edema, etc.): VSS      Pertinent Vitals/Pain Pain Assessment Pain Assessment: No/denies pain     Home Living                          Prior Function            PT Goals (current goals can now be found in the care plan section) Progress towards PT goals: Progressing toward goals    Frequency    Min 2X/week      PT Plan      Co-evaluation              AM-PAC PT 6 Clicks Mobility   Outcome Measure  Help needed turning from your back to your side while in a flat bed without using bedrails?: A Little Help needed moving from lying on your back to sitting on the side of a flat bed without using bedrails?: A Little Help needed moving to and from a bed to a chair (including a wheelchair)?: A Little Help needed standing up from a chair using your arms (e.g., wheelchair or bedside chair)?: A Little Help needed to walk in hospital room?: A Little Help needed climbing 3-5 steps with a railing? : A Lot 6 Click Score: 17    End of Session Equipment Utilized During Treatment: Gait belt Activity Tolerance: Patient tolerated treatment well Patient left: in bed;with call bell/phone within reach;with bed alarm set Nurse Communication: Mobility status PT Visit Diagnosis: Other abnormalities of gait and mobility (R26.89)     Time: 8488-8466 PT Time Calculation (min) (ACUTE ONLY): 22 min  Charges:    $Gait Training: 8-22 mins PT General Charges $$ ACUTE PT VISIT: 1 Visit                     [name], PT, DPT Acute Rehab Services [phone]    [name] 08/09/2024, 4:30 PM

## 2024-08-09 NOTE — Progress Notes (Signed)
 Nutrition Follow-up  DOCUMENTATION CODES:   Non-severe (moderate) malnutrition in context of chronic illness  INTERVENTION:  Modify tube feeding via Cortrak to cyclic schedule (6PM-6AM): Osmolite 1.5 at 75 ml/h (900 ml per day) Prosource TF20 60 ml daily FWF 200 ml q6h, per MD (800 ml per day) Provides 1430 kcal (75% estimated needs), 76 gm protein (90% estimated needs), 686 ml free water  daily (2172 ml per day TF + FWF)   Add Magic cup TID with meals, each supplement provides 290 kcal and 9 grams of protein    Monitor continued progress with SLP and diet recommendations   NUTRITION DIAGNOSIS:  Moderate Malnutrition related to chronic illness as evidenced by moderate muscle depletion, severe muscle depletion, moderate fat depletion. - remains applicable  GOAL:  Patient will meet greater than or equal to 90% of their needs - being met via TF + PO diet  MONITOR:  Diet advancement, Labs, TF tolerance  REASON FOR ASSESSMENT:  Consult Enteral/tube feeding initiation and management  ASSESSMENT:   Pt with hx of diabetes, HLD, HTN, COPD, NSTEMI, suspected malignant lung mass and PAD. 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 Admitted with slurred speech and R facial droop, diagnosed stroke and SDH.  1/21 admitted 1/22 advanced to DYS 1, honey thick 1/23 made NPO after repeated coughing episodes, Cortrak placed, TF initiated 1/26 - diet advanced to DYS1/honey thickened liquids 1/27 modify to   Advanced to DYS1/honey thickened liquid diet yesterday. Speech recommending leaving Cortrak in place as it was difficult to advance diet. He remains at risk for dehydration as difficult to glean free water  from thickened liquids. Free water  flushes in in place. Continues to improve.   Pt up in chair at time of assessment today. Speech has improved. Slow to respond at times, but answers appropriately.     Will modify TF regimen in an effort to maximize PO intake during the day. Will run  nocturnally from 6PM-6AM and meet approximately 75% estimated calorie needs and 95% estimated protein needs. Added Magic Cup to augment intake.      Nutritionally Relevant Medications: SS Novolog  Miralax  Thiamine  (end date 1/30)   Labs reviewed: Sodium 134 CBGs 144-177 x24 hours A1c 6.1  Low sodium. Suspect related to hypovolemia. Free water  flushes added and holding hydrochlorothiazide.   Admit Weight: 81.6 kg Current Weight: 81.7 kg  Weight stable. No significant edema noted on exam. Bowels moving, per patient report.  Documented with BM type 6 x2 yesterday.   Diet Order:   Diet Order             DIET - DYS 1 Room service appropriate? No; Fluid consistency: Honey Thick  Diet effective now                   EDUCATION NEEDS:  No education needs have been identified at this time  Skin:  Skin Assessment: Reviewed RN Assessment  Last BM:  1/26 - type 6 x2  Height:  Ht Readings from Last 1 Encounters:  08/03/24 6' (1.829 m)   Weight:  Wt Readings from Last 1 Encounters:  08/09/24 81.7 kg    Ideal Body Weight:  80.91 kg  BMI:  Body mass index is 24.43 kg/m.  Estimated Nutritional Needs:   Kcal:  1900-2100  Protein:  85-100g  Fluid:  2L  Blair Deaner MS, RD, LDN Registered Dietitian I Clinical Nutrition RD Inpatient Contact Info in Amion

## 2024-08-09 NOTE — Plan of Care (Signed)

## 2024-08-09 NOTE — Progress Notes (Signed)
 Speech Language Pathology Treatment: Dysphagia  Patient Details Name: Mark Dickerson MRN: [id number] DOB: 19-Jan-1946 Today's Date: 08/09/2024 Time: 8880-8865 SLP Time Calculation (min) (ACUTE ONLY): 15 min  Assessment / Plan / Recommendation Clinical Impression  Pt consumed honey-thick water  and sweet tea, as well as puree, and reports managing breakfast w/o coughing or throat clearing. RN also reports no overt s/s of aspiration while pt ate breakfast. Pt had delayed coughing after two trials of sequential sips with the honey-thick liquids. Pt's involuntary cough sounded relatively stronger than his volitional cough. Pt did not display overt s/s of aspiration when consuming small bites and sips of puree and honey-thick. Pt required one verbal cue to take a small sip after previously trial of sequential sips. Pt demonstrated some anterior loss when drinking from cup edge, however immediately used washcloth to wipe face and chest.   PLAN: Will continue Dys 1 (puree)/honey thick liquids by cup or spoon (no straws) with supervision. Repeat MBS prior to discharge to determine any improvements in oropharyngeal swallow.    HPI HPI: Pt is a 79 y/o male presenting on 08/03/24 with slurred speech, R facial droop, and difficulty swallowing since 1/19. Found with small volume subdural hematoma along the falx, also with L basal ganglia infarct. Repeat CT stable. 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      SLP Plan  Continue with current plan of care        Swallow Evaluation Recommendations   Recommendations: PO diet PO Diet Recommendation: Dysphagia 1 (Pureed);Moderately thick liquids (Level 3, honey thick) Liquid Administration via: Spoon;Cup;No straw Medication Administration: Crushed with puree Supervision: Patient able to self-feed;Full supervision/cueing for swallowing strategies Postural changes: Position pt fully upright for  meals;Stay upright 30-60 min after meals Oral care recommendations: Oral care BID (2x/day) Caregiver Recommendations: Avoid jello, ice cream, thin soups, popsicles;Remove water  pitcher     Recommendations                           Dysphagia, oropharyngeal phase (R13.12)     Continue with current plan of care     Rocky Quan, Student SLP  08/09/2024, 11:59 AM

## 2024-08-09 NOTE — Progress Notes (Signed)
 "  HD#6 SUBJECTIVE:  Patient Summary: Mark Dickerson is an independently-living (with family support) 79 y.o. 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   Overnight Events: BP increased to 190s/110s yesterday evening and received IV Labetalol  5 mg once. Pressures returned to goal SBP < 160.  Interim History: Patient resting comfortably in bed.  He continues to feel improvement in terms of speech and swallowing function.  He says that he did not have any troubles eating last night or this morning with breakfast.  He thinks that his cough is not necessarily related to the food.  The patient does not have any other acute concerns.  Would like to leave the hospital soon.  OBJECTIVE:  Vital Signs: Vitals:   08/09/24 0638 08/09/24 0801 08/09/24 1041 08/09/24 1144  BP:  124/69 (!) 180/90 (!) 187/86  Pulse:  68 86 70  Resp:  16  15  Temp:  98 F (36.7 C)  97.7 F (36.5 C)  TempSrc:    Oral  SpO2:  97%  99%  Weight: 81.7 kg     Height:       Supplemental O2: Room Air SpO2: 99 %  Filed Weights   08/07/24 0500 08/08/24 0402 08/09/24 0638  Weight: 80.9 kg 81.8 kg 81.7 kg     Intake/Output Summary (Last 24 hours) at 08/09/2024 1257 Last data filed at 08/09/2024 0900 Gross per 24 hour  Intake 600 ml  Output 1050 ml  Net -450 ml   Net IO Since Admission: -1,534.17 mL [08/09/24 1257]  Physical Exam: Constitutional: well-appearing, elderly male in no acute distress HENT: normocephalic atraumatic, Dobhoff tube in  place via right nare Eyes: conjunctiva non-erythematous, PERRL, no scleral icterus Cardiovascular: irregularly irregular rhythm with regular rate, no m/r/g Pulmonary/Chest: normal work of breathing on room air, anterior lung fields clear to auscultation bilaterally Abdominal: soft, non-tender, non-distended, bowel sounds normal MSK: normal bulk and tone GU: + condom cathter in place Neurological: alert & oriented x3, dysarthria improving.  No aphasia; mild right facial droop upon smile, otherwise CN II-XII intact.  Moving upper and lower extremity spontaneously and equally. Skin: warm and dry Extremities: no edema or cyanosis; peripheral pulses intact; SCDs in place Psych: normal mood and affect, thought content normal   Patient Lines/Drains/Airways Status     Active Line/Drains/Airways     Name Placement date Placement time Site Days   Peripheral IV 08/03/24 20 G Anterior;Right Forearm 08/03/24  1025  Forearm  6   External Urinary Catheter 08/07/24  1957  --  2   Small Bore Feeding Tube 10 Fr. Left nare Marking at nare/corner of mouth 85 cm 08/05/24  1524  Left nare  4            Pertinent labs and imaging:      Latest Ref Rng & Units 08/07/2024    7:40 AM 08/06/2024    7:56 AM 08/05/2024    6:50 AM  CBC  WBC 4.0 - 10.5 K/uL 8.4  7.6  7.8   Hemoglobin 13.0 - 17.0 g/dL 84.5  85.5  14.9  Hematocrit 39.0 - 52.0 % 44.7  41.6  43.5   Platelets 150 - 400 K/uL 191  192  174        Latest Ref Rng & Units 08/09/2024    5:37 AM 08/08/2024    1:27 AM 08/07/2024    7:40 AM  CMP  Glucose 70 - 99 mg/dL 858  819  844   BUN 8 - 23 mg/dL 19  18  18    Creatinine 0.61 - 1.24 mg/dL 9.17  9.14  9.26   Sodium 135 - 145 mmol/L 134  137  138   Potassium 3.5 - 5.1 mmol/L 3.8  3.7  3.3   Chloride 98 - 111 mmol/L 98  102  103   CO2 22 - 32 mmol/L 25  24  22    Calcium  8.9 - 10.3 mg/dL 89.9  9.9  9.9     No results found.   ASSESSMENT/PLAN:  Assessment: Principal Problem:   Acute  stroke of basal ganglia (HCC) Active Problems:   Hypertension   Hyperlipidemia   Type 2 diabetes mellitus with complication, without long-term current use of insulin  (HCC)   Subdural hematoma (HCC)   Atrial fibrillation (HCC)   Malnutrition of moderate degree   SDH (subdural hematoma) (HCC)   Acute ischemic stroke (HCC)   Plan: #Acute stroke of left basal ganglia w/ severe dysphagia Exam with slight improvement to speech function and facial droop compared to yesterday.  Patient had acute infarct to left basal ganglia which was seen on initial MRI. A1c optimized, LDL not at goal so was started on high-intensity statin. Patient continues to be in rate-controlled atrial fibrillation (newly onset this admission) but no definitive embolic source found on TTE. Holding DAPT d/t prior SDH. Will continue with daily neuro exams. PT/OT/SLP continue to work with the patient on his activity levels, swallowing, and speech. After concerns while on dysphagia I diet, patient had Dobhoff placed with Cortrak.  SLP placed patient back on dysphagia 1 diet, if he does well we will consider removing Dobbhoff tube.  PT recommending CIR but patient is not a candidate due to lack of 24/7 support, now pursuing SNF. Will need Dobhoff removed prior to discharge. Appreciate SLP assistance. Neurology signed off. - Neurology consulted, appreciate recs  - Continue rosuvastatin  20 mg daily  - Hold antiplatelet therapy d/t SDH until repeat CT scan in 2-3 weeks (2/5-2/12) shows near resolution of SDH  - SBP goal < 160, long-term goal of normotension  - Continuous cardiac monitoring  - Outpatient f/u with stroke clinic at [facility] - Keep Cortrak in place, trialing Dysphagia 1 diet - Appreciate SLP recommendations - Delirium precautions  #Subdural hematoma #Probable fall on antiplatelet therapy #History of Falls  Patient has had a concerning history of falls over the last 9 months, voiced by the patient's family. They show concern  regarding his weight loss and weakness secondary to his suspected lung cancer. They deny any indications that the patient's falls are mechanical in nature. Most recent fall suspected to be prior to presentation at [facility] while on antiplatelet therapy. No new symptoms indicative of increasing ICP or bleeding. Neurology recommended holding antiplatelet therapy until further imaging of SDH in 2-3 weeks, about 08/18/24-08/25/24. Anticipate discharge to SNF. - Neurology consulted, appreciate recs  - Repeat CT scan of the brain in 2-3 weeks to assess SDH (around 2/5-2/12) - Hold antiplatelet therapy until repeat CT scan shows near resolution of SDH  #New-onset atrial fibrillation without RVR  Patient is  in A-fib without RVR. No prior history of A-fib or anticoagulation. CHA2DS2-VASc of 7. Rates have been within normal limits. Holding AC d/t SDH. We will continue to monitor his rates and, if needed, will give him IV beta blocker. - Continuous cardiac monitoring - Metoprolol  tartrate 6.25 mg per tube bid  #Hyponatremia Suspect hypovolemic. Na of 134 this morning. Will continue trending renal function and holding hydrochlorothiazide. - Trend BMPs  #Hypertension  Patient has a history of poorly controlled hypertension presumably managed with amlodipine , metoprolol , benazepril , and hydrochlorothiazide. Neurology recommends SBP <160 with long-term goal of normotension. Patient's pressures have been elevated above systolic goal, ranging from 150s-190s SBP. Will continue holding hydrochlorothiazide. - Increase amlodipine  to 10 mg daily - Continue home benazepril  40 mg daily - Switch to metoprolol  tartrate 12.5 mg twice daily - Hold home hydrochlorothiazide  #Type 2 diabetes mellitus, non-insulin  dependent, controlled, complicated by peripheral neuropathy and large vessel disease Patient has diabetes well controlled with metformin 500 mg daily. Most recent A1C in the hospital was A1C and BGLs ranging in the 110s  without insulin . Given patient's control of his diabetes and limited PO intake, will hold sensitive SSI. We will reassess his need for sensitive SSI as his PO intake progresses. - Hold home metformin 500 mg daily - Sensitive SSI  #Hypokalemia, resolved Prior hypokalemia may have been d/t poor oral intake. Dobhoff now in place, will continue monitoring. - Trend BMPs  #Hyperlipidemia Patient has a history of HLD managed with pravastatin. Most recent lipid panel in the hospital showed LDL of 109, significantly above goal of <70, contributing to his acute stroke risk. Transitioned to rosuvastatin  20 mg daily. 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 Patient declined biopsy and treatment, and denies experiencing any pain or difficulty breathing due to COPD or mass. He is not in any respiratory distress and has been oxygenating well on room air. - Home albuterol  inhaler prn - Robitussin 5 ml q4h PRN for cough/loosen phlegm  #PAD (with multiple LE revascularization procedures) #History of NSTEMI (post-procedure) Patient has an extensive history of PAD with multiple lower extremity revascularization procedures as well as post-procedure NSTEMI in 2022. For his PAD, he has been on antiplatelet therapy which has been held in context of his SDH.  - Hold home antiplatelet therapy per recommendations above  #Constipation - MiraLAX  17 g per tube twice daily - Sorbitol  70% solution 15 mL per tube daily as needed  Best Practice: Diet: Dysphagia I, moderately thick liquids (Dobhoff in place) IVF: Fluids: 0.9NS, Rate: 1000 cc bolus VTE: enoxaparin  (LOVENOX ) injection 40 mg Start: 08/09/24 1400 Place and maintain sequential compression device Start: 08/03/24 1848 Code:  Full  Disposition planning: Therapy Recs: CIR, DME: none Family Contact: brother and sister-in-law, to be notified. DISPO: Anticipated discharge in 1-2 days to SNF pending insurance auth and removal of Dobhoff.  Signature:  Letha Cheadle, MD Internal Medicine Resident  PGY-1 12:57 PM, 08/09/2024  On Call pager [phone]  "

## 2024-08-09 NOTE — Plan of Care (Addendum)
 SBP was greater than 180 so PRN Labetalol  5 mg given once.Tried his best to have food through mouth. Was kept in chair.    Problem: Education: Goal: Knowledge of disease or condition will improve Outcome: Progressing   Problem: Ischemic Stroke/TIA Tissue Perfusion: Goal: Complications of ischemic stroke/TIA will be minimized Outcome: Progressing   Problem: Coping: Goal: Will verbalize positive feelings about self Outcome: Progressing   Problem: Coping: Goal: Ability to adjust to condition or change in health will improve Outcome: Progressing   Problem: Skin Integrity: Goal: Risk for impaired skin integrity will decrease Outcome: Progressing   Problem: Safety: Goal: Ability to remain free from injury will improve Outcome: Progressing   Problem: Pain Managment: Goal: General experience of comfort will improve and/or be controlled Outcome: Progressing   Problem: Fluid Volume: Goal: Ability to maintain a balanced intake and output will improve Outcome: Progressing   Problem: Clinical Measurements: Goal: Will remain free from infection Outcome: Progressing   Problem: Nutrition: Goal: Risk of aspiration will decrease Outcome: Progressing   Problem: Nutrition: Goal: Dietary intake will improve Outcome: Progressing

## 2024-08-10 DIAGNOSIS — I4891 Unspecified atrial fibrillation: Secondary | ICD-10-CM | POA: Diagnosis not present

## 2024-08-10 DIAGNOSIS — I6200 Nontraumatic subdural hemorrhage, unspecified: Secondary | ICD-10-CM | POA: Diagnosis not present

## 2024-08-10 DIAGNOSIS — R1310 Dysphagia, unspecified: Secondary | ICD-10-CM | POA: Diagnosis not present

## 2024-08-10 DIAGNOSIS — I69391 Dysphagia following cerebral infarction: Secondary | ICD-10-CM | POA: Diagnosis not present

## 2024-08-10 LAB — BASIC METABOLIC PANEL WITH GFR
Anion gap: 12 (ref 5–15)
BUN: 22 mg/dL (ref 8–23)
CO2: 24 mmol/L (ref 22–32)
Calcium: 9.5 mg/dL (ref 8.9–10.3)
Chloride: 99 mmol/L (ref 98–111)
Creatinine, Ser: 0.77 mg/dL (ref 0.61–1.24)
GFR, Estimated: >60 mL/min
Glucose, Bld: 149 mg/dL — ABNORMAL HIGH (ref 70–99)
Potassium: 4.1 mmol/L (ref 3.5–5.1)
Sodium: 134 mmol/L — ABNORMAL LOW (ref 135–145)

## 2024-08-10 LAB — CBC
HCT: 40.7 % (ref 39.0–52.0)
Hemoglobin: 14.1 g/dL (ref 13.0–17.0)
MCH: 31.8 pg (ref 26.0–34.0)
MCHC: 34.6 g/dL (ref 30.0–36.0)
MCV: 91.9 fL (ref 80.0–100.0)
Platelets: 194 10*3/uL (ref 150–400)
RBC: 4.43 MIL/uL (ref 4.22–5.81)
RDW: 15.4 % (ref 11.5–15.5)
WBC: 10.2 10*3/uL (ref 4.0–10.5)
nRBC: 0 % (ref 0.0–0.2)

## 2024-08-10 LAB — GLUCOSE, CAPILLARY
Glucose-Capillary: 93 mg/dL (ref 70–99)
Glucose-Capillary: 138 mg/dL — ABNORMAL HIGH (ref 70–99)
Glucose-Capillary: 184 mg/dL — ABNORMAL HIGH (ref 70–99)
Glucose-Capillary: 170 mg/dL — ABNORMAL HIGH (ref 70–99)
Glucose-Capillary: 142 mg/dL — ABNORMAL HIGH (ref 70–99)
Glucose-Capillary: 167 mg/dL — ABNORMAL HIGH (ref 70–99)

## 2024-08-10 MED ORDER — QUETIAPINE FUMARATE 25 MG PO TABS
25.0000 mg | ORAL_TABLET | Freq: Every evening | ORAL | Status: DC | PRN
  Administered 2024-08-10 – 2024-08-14 (×4): 25 mg via ORAL
  Filled 2024-08-10 (×4): qty 1

## 2024-08-10 MED ORDER — METOPROLOL SUCCINATE ER 25 MG PO TB24
25.0000 mg | ORAL_TABLET | Freq: Every day | ORAL | Status: DC
  Filled 2024-08-10: qty 1

## 2024-08-10 MED ORDER — FREE WATER
150.0000 mL | Freq: Four times a day (QID) | Status: DC
  Administered 2024-08-10 – 2024-08-12 (×9): 150 mL

## 2024-08-10 MED ORDER — HYDROXYZINE HCL 25 MG PO TABS
25.0000 mg | ORAL_TABLET | Freq: Three times a day (TID) | ORAL | Status: DC | PRN
  Administered 2024-08-10 – 2024-08-14 (×4): 25 mg via ORAL
  Filled 2024-08-10 (×4): qty 1

## 2024-08-10 MED ORDER — METOPROLOL TARTRATE 25 MG/10 ML ORAL SUSPENSION
12.5000 mg | Freq: Two times a day (BID) | ORAL | Status: DC
  Administered 2024-08-10 – 2024-08-12 (×4): 12.5 mg
  Filled 2024-08-10 (×5): qty 5

## 2024-08-10 NOTE — Plan of Care (Signed)

## 2024-08-10 NOTE — TOC CAGE-AID Note (Signed)
 Transition of Care [facility]) - CAGE-AID Screening   Patient Details  Name: Mark Dickerson MRN: [id number] Date of Birth: August 20, 1945  [facility] CM/SW Contact:    Avraham Benish E Dontrez Pettis, LCSW Phone Number: 08/10/2024, 10:40 AM   Clinical Narrative: No SA noted.   CAGE-AID Screening:    Have You Ever Felt You Ought to Cut Down on Your Drinking or Drug Use?: No Have People Annoyed You By Critizing Your Drinking Or Drug Use?: No Have You Felt Bad Or Guilty About Your Drinking Or Drug Use?: No Have You Ever Had a Drink or Used Drugs First Thing In The Morning to Steady Your Nerves or to Get Rid of a Hangover?: No CAGE-AID Score: 0  Substance Abuse Education Offered: No

## 2024-08-10 NOTE — Plan of Care (Signed)
" °  Problem: Ischemic Stroke/TIA Tissue Perfusion: Goal: Complications of ischemic stroke/TIA will be minimized Outcome: Progressing   Problem: Health Behavior/Discharge Planning: Goal: Goals will be collaboratively established with patient/family Outcome: Progressing   Problem: Self-Care: Goal: Ability to participate in self-care as condition permits will improve Outcome: Progressing Goal: Verbalization of feelings and concerns over difficulty with self-care will improve Outcome: Progressing Goal: Ability to communicate needs accurately will improve Outcome: Progressing   Problem: Nutrition: Goal: Risk of aspiration will decrease Outcome: Progressing   Problem: Clinical Measurements: Goal: Ability to maintain clinical measurements within normal limits will improve Outcome: Progressing Goal: Will remain free from infection Outcome: Progressing Goal: Diagnostic test results will improve Outcome: Progressing Goal: Respiratory complications will improve Outcome: Progressing Goal: Cardiovascular complication will be avoided Outcome: Progressing   Problem: Activity: Goal: Risk for activity intolerance will decrease Outcome: Progressing   Problem: Elimination: Goal: Will not experience complications related to bowel motility Outcome: Progressing Goal: Will not experience complications related to urinary retention Outcome: Progressing   Problem: Pain Managment: Goal: General experience of comfort will improve and/or be controlled Outcome: Progressing   Problem: Safety: Goal: Ability to remain free from injury will improve Outcome: Progressing   Problem: Skin Integrity: Goal: Risk for impaired skin integrity will decrease Outcome: Progressing   "

## 2024-08-10 NOTE — Progress Notes (Cosign Needed)
 "  HD#7 SUBJECTIVE:  Patient Summary: Mark Dickerson is an independently-living (with family support) 79 y.o. 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   Overnight Events: No acute events overnight.  Interim History: Patient sitting in chair. Per nursing, he has been able to transfer himself from bed to chair. He ate 45% of this breakfast this morning, did not have difficulties swallowing. He just did not find the food very appetizing. He doesn't really like the thick liquids. He doesn't think he has any problems swallowing. Mentions he had a large bowel movement his morning. No pain, bleeding, or melena noted. No new complaints this morning. No new weakness in his arms or legs, feels well.   OBJECTIVE:  Vital Signs: Vitals:   08/09/24 2345 08/10/24 0338 08/10/24 0557 08/10/24 0800  BP: (!) 169/90 (!) 169/77  (!) 170/88  Pulse: 73 80    Resp: 20 20  20   Temp: 97.9 F (36.6 C) 97.8 F (36.6 C)  97.9 F (36.6 C)  TempSrc: Oral Oral  Oral  SpO2: 98% 97%  97%  Weight:   81.6 kg   Height:       Supplemental O2: Room Air SpO2: 97 %  Filed Weights   08/08/24 0402 08/09/24 0638 08/10/24 0557  Weight: 81.8 kg 81.7 kg 81.6 kg     Intake/Output Summary (Last 24 hours) at 08/10/2024 1021 Last data filed at 08/10/2024 0900 Gross per 24 hour  Intake 3865.83 ml  Output 600 ml  Net 3265.83 ml   Net IO Since Admission: 1,791.66 mL [08/10/24 1021]  Physical Exam: Constitutional: well-appearing, elderly male in no  acute distress HENT: normocephalic atraumatic, Dobhoff tube in place via right nare Eyes: conjunctiva non-erythematous, PERRL, no scleral icterus Cardiovascular: irregularly irregular rhythm with regular rate, no m/r/g Pulmonary/Chest: normal work of breathing on room air, anterior lung fields clear to auscultation bilaterally Abdominal: soft, non-tender, non-distended, bowel sounds normal MSK: normal bulk and tone Neurological: alert & oriented x3, mild right facial droop upon smile, otherwise CN II-XII intact.  Moving upper and lower extremity spontaneously and equally. Skin: warm and dry Extremities: no edema or cyanosis; peripheral pulses intact Psych: normal mood and affect, thought content normal   Patient Lines/Drains/Airways Status     Active Line/Drains/Airways     Name Placement date Placement time Site Days   Peripheral IV 08/03/24 20 G Anterior;Right Forearm 08/03/24  1025  Forearm  7   External Urinary Catheter 08/07/24  1957  --  3   Small Bore Feeding Tube 10 Fr. Left nare Marking at nare/corner of mouth 85 cm 08/05/24  1524  Left nare  5            Pertinent labs and imaging:      Latest Ref Rng & Units 08/10/2024    1:18 AM 08/07/2024    7:40 AM 08/06/2024    7:56 AM  CBC  WBC 4.0 - 10.5 K/uL 10.2  8.4  7.6   Hemoglobin 13.0 - 17.0 g/dL 85.8  84.5  85.5   Hematocrit 39.0 - 52.0 % 40.7  44.7  41.6   Platelets 150 - 400 K/uL 194  191  192        Latest Ref Rng & Units 08/10/2024    1:18 AM 08/09/2024    5:37 AM 08/08/2024    1:27 AM  CMP  Glucose 70 - 99 mg/dL 850  858  819   BUN 8 - 23 mg/dL 22  19  18    Creatinine 0.61 - 1.24 mg/dL 9.22  9.17  9.14   Sodium 135 - 145 mmol/L 134  134  137   Potassium 3.5 - 5.1 mmol/L 4.1  3.8  3.7   Chloride 98 - 111 mmol/L 99  98  102   CO2 22 - 32 mmol/L 24  25  24    Calcium  8.9 - 10.3 mg/dL 9.5  89.9  9.9     No results found.   ASSESSMENT/PLAN:  Assessment: Principal Problem:   Acute stroke of basal  ganglia (HCC) Active Problems:   Hypertension   Hyperlipidemia   Type 2 diabetes mellitus with complication, without long-term current use of insulin  (HCC)   Subdural hematoma (HCC)   Atrial fibrillation (HCC)   Malnutrition of moderate degree   SDH (subdural hematoma) (HCC)   Acute ischemic stroke (HCC)   Plan: #Acute stroke of left basal ganglia w/ severe dysphagia Improving overall since admission.  Patient had acute infarct to left basal ganglia which was seen on initial MRI. A1c optimized, LDL not at goal so was started on high-intensity statin. Patient continues to be in rate-controlled atrial fibrillation (newly onset this admission) but no definitive embolic source found on TTE. Holding DAPT d/t prior SDH. Will continue with daily neuro exams. PT/OT/SLP continue to work with the patient on his activity levels, swallowing, and speech. After concerns while on dysphagia I diet, patient had Dobhoff placed with Cortrak.  SLP placed patient back on dysphagia 1 diet, if he does well we will consider removing Dobbhoff tube. May repeat MBS prior to discharge per SLP. If improvement, will advance diet.  PT recommending CIR but patient is not a candidate due to lack of 24/7 support, now pursuing SNF. Appreciate SLP assistance. - Neurology recs (signed off)  - Continue rosuvastatin  20 mg daily  - Hold antiplatelet therapy d/t SDH until repeat CT scan in 2-3 weeks (2/5-2/12) shows near resolution of SDH  - SBP goal < 160, long-term goal of normotension  - Continuous cardiac monitoring  - Outpatient f/u with stroke clinic at [facility] - Keep Cortrak in place, continue Dysphagia 1 diet - Reducing free water  to 150 ml q6h - Appreciate SLP help - Delirium precautions  #Subdural hematoma #Probable fall on antiplatelet therapy #History of Falls  Patient has had a concerning history of falls over the last 9 months, voiced by the patient's family. They show concern regarding his weight loss and weakness  secondary to his suspected lung cancer. They deny any indications that the patient's falls are mechanical in nature. Most recent fall suspected to be prior to presentation at [facility] while on antiplatelet therapy. No new symptoms indicative of increasing ICP or bleeding. Neurology recommended holding antiplatelet therapy until further imaging of SDH in 2-3 weeks, about 08/18/24-08/25/24. Anticipate discharge to SNF. - Neurology consulted, appreciate recs  - Repeat CT scan of the brain in 2-3 weeks to assess SDH (around 2/5-2/12) - Hold antiplatelet therapy until repeat CT scan shows near resolution of SDH  #New-onset atrial fibrillation without RVR  Patient is in A-fib without RVR. No prior history  of A-fib or anticoagulation. CHA2DS2-VASc of 7. Rates have been within normal limits. Holding AC d/t SDH. We will continue to monitor his rates and, if needed, will give him IV beta blocker. - Continuous cardiac monitoring - Metoprolol  tartrate 6.25 mg per tube bid  #Hyponatremia Suspect hypovolemic. Na of 134 this morning. Will continue trending renal function and holding hydrochlorothiazide. - Trend BMPs  #Hypertension  Patient has a history of poorly controlled hypertension presumably managed with amlodipine , metoprolol , benazepril , and hydrochlorothiazide. Received IV Labetalol  yesterday, pressures improved. Neurology recommends SBP <160 with long-term goal of normotension. Holding home hydrochlorothiazide due to concerns with hyponatremia. - Continue amlodipine  10 mg daily - Continue home benazepril  40 mg daily - Switch to metoprolol  tartrate 12.5 mg twice daily - Hold home hydrochlorothiazide - IV Labetalol  5 mg q2h PRN for SBP > 170 or DBP > 110  #Type 2 diabetes mellitus, non-insulin  dependent, controlled, complicated by peripheral neuropathy and large vessel disease Patient has diabetes well controlled with metformin 500 mg daily. Most recent A1C in the hospital was A1C and BGLs ranging in the  110s without insulin . Given patient's control of his diabetes and limited PO intake, will hold sensitive SSI. We will reassess his need for sensitive SSI as his PO intake progresses. - Hold home metformin 500 mg daily - Sensitive SSI  #Hypokalemia, resolved Prior hypokalemia may have been d/t poor oral intake. Dobhoff now in place, will continue monitoring as patient advances diet and feeding tube is removed. - Trend BMPs  #Hyponatremia Stable at 134. Receiving free water  through Cortrak, will decrease amount and monitor for improvement. - Reduce free water  to 150 ml q6h - Trend BMPs  #Hyperlipidemia Patient has a history of HLD managed with pravastatin. Most recent lipid panel in the hospital showed LDL of 109, significantly above goal of <70, contributing to his acute stroke risk. Transitioned to rosuvastatin  20 mg daily. 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 Patient declined biopsy and treatment, and denies experiencing any pain or difficulty breathing due to COPD or mass. He is not in any respiratory distress and has been oxygenating well on room air. - Home albuterol  inhaler prn - Robitussin 5 ml q4h PRN for cough/loosen phlegm  #PAD (with multiple LE revascularization procedures) #History of NSTEMI (post-procedure) Patient has an extensive history of PAD with multiple lower extremity revascularization procedures as well as post-procedure NSTEMI in 2022. For his PAD, he has been on antiplatelet therapy which has been held in context of his SDH.  - Hold home antiplatelet therapy per recommendations above  #Constipation - MiraLAX  17 g per tube twice daily - Sorbitol  70% solution 15 mL per tube daily as needed  Best Practice: Diet: Dysphagia I, moderately  thick liquids (Dobhoff in place) IVF: Fluids: 0.9NS, Rate: 1000 cc bolus VTE: enoxaparin  (LOVENOX ) injection 40 mg Start: 08/09/24 1400 Place and maintain sequential compression device Start: 08/03/24 1848 Code: Full  Disposition planning: Therapy Recs: CIR, DME: none Family Contact: brother and sister-in-law, to be notified. DISPO: Anticipated discharge in 1-2 days to SNF pending insurance auth and removal of Dobhoff.  Signature:  Letha Cheadle, MD Internal Medicine Resident  PGY-1 10:21 AM, 08/10/2024  On Call pager [phone]  "

## 2024-08-10 NOTE — TOC Progression Note (Signed)
 Transition of Care [facility]) - Progression Note    Patient Details  Name: Mark Dickerson MRN: [id number] Date of Birth: 1945/07/28  Transition of [facility] CM/SW Contact  [name], [name] Phone Number: 08/10/2024, 1:04 PM  Clinical Narrative:   Patient continues with cortrak, medical workup ongoing. Patient with no bed offers at this time. CSW reached out to [facility] [employer] to ask about availability, awaiting response. CSW to follow.    Expected Discharge Plan: Skilled Nursing Facility Barriers to Discharge: Continued Medical Work up, English As A Second Language Teacher               Expected Discharge Plan and Services   Discharge Planning Services: CM Consult Post Acute Care Choice: IP Rehab Living arrangements for the past 2 months: Single Family Home                                       Social Drivers of Health (SDOH) Interventions SDOH Screenings   Food Insecurity: Patient Declined (08/04/2024)  Housing: Unknown (08/04/2024)  Transportation Needs: Patient Declined (08/04/2024)  Utilities: Patient Declined (08/04/2024)  Social Connections: Patient Declined (08/04/2024)  Tobacco Use: High Risk (09/17/2023)   Received from [facility]    Readmission Risk Interventions     No data to display

## 2024-08-10 NOTE — Progress Notes (Signed)
 Physical Therapy Treatment Patient Details Name: Mark Dickerson MRN: [id number] DOB: December 31, 1945 Today's Date: 08/10/2024   History of Present Illness Pt is 79 y/o male presenting on 08/03/24 with slurred speech, R facial droop since 1/19. Found with small volume subdural hematoma along the falx, also with L basal ganglia infarct. Repeat CT stable. PMH includes: DM, HTN, MI, PAD.    PT Comments  Pt greeted supine in bed, pleasant and agreeable to PT session. He engaged in gait training with emphasis placed on improved sequencing and increased safety awareness. Pt demonstrated slight right inattention and worsening gait abnormalities as he fatigued. He required modA to aid in RW management as he frequently advanced the AD too far in front of him or had his RLE outside the AD. Multi-modal cues for correct technique with minimal adjustments note and inability to maintain any changes. Patient will benefit from continued inpatient follow up therapy, <3 hours/day.     If plan is discharge home, recommend the following: A lot of help with bathing/dressing/bathroom;Assistance with cooking/housework;Assist for transportation;Help with stairs or ramp for entrance;A lot of help with walking and/or transfers   Can travel by private vehicle     Yes  Equipment Recommendations  BSC/3in1    Recommendations for Other Services       Precautions / Restrictions Precautions Precautions: Fall Recall of Precautions/Restrictions: Intact Precaution/Restrictions Comments: dysarthric; cortrak Restrictions Weight Bearing Restrictions Per Provider Order: No     Mobility  Bed Mobility Overal bed mobility: Needs Assistance Bed Mobility: Supine to Sit     Supine to sit: Min assist, HOB elevated, Used rails     General bed mobility comments: HOB elevated 30deg. Pt brought BLE off EOB. Cues for use of bed rail. Assist to elevate trunk and scoot hips fwd til feet flat.    Transfers Overall transfer level:  Needs assistance Equipment used: Rolling walker (2 wheels) Transfers: Sit to/from Stand, Bed to chair/wheelchair/BSC Sit to Stand: Min assist   Step pivot transfers: Min assist       General transfer comment: Pt stood from lowest bed height. Cued proper hand placement using RW. Powered up with minA. Transferred to recliner chair. Cues for sequencing and increased safety awareness. Fair eccentric control.    Ambulation/Gait Ambulation/Gait assistance: Min assist, Mod assist Gait Distance (Feet): 150 Feet Assistive device: Rolling walker (2 wheels) Gait Pattern/deviations: Step-to pattern, Decreased stride length, Decreased dorsiflexion - right, Trunk flexed Gait velocity: Decreased Gait velocity interpretation: <1.8 ft/sec, indicate of risk for recurrent falls   General Gait Details: Pt ambulated with short slow steps. As he fatigued, pt's RLE became further behind him likely d/t inattention and he would drag RLE along in hip ER. Limited foot clearance on RLE. Pt required constant cues and assist for proper proximity to RW. He frequently kept BUE fully extended out in front of him with trunk flex. Re-positioned pt and provided multi-modal cues emphasizing upright posture, slight bend in the elbow, and feet inside AD. Pt had significant difficulty with turning allowing RLE to get outside AD. ModA for RW management and safety.   Stairs             Wheelchair Mobility     Tilt Bed    Modified Rankin (Stroke Patients Only) Modified Rankin (Stroke Patients Only) Pre-Morbid Rankin Score: No symptoms Modified Rankin: Moderately severe disability     Balance Overall balance assessment: Needs assistance Sitting-balance support: No upper extremity supported, Feet supported Sitting balance-Leahy Scale: Fair Sitting balance -  Comments: Sat EOB   Standing balance support: Bilateral upper extremity supported, During functional activity Standing balance-Leahy Scale: Poor Standing  balance comment: Dependent on RW and min-modA.                            Communication Communication Communication: Impaired Factors Affecting Communication: Reduced clarity of speech  Cognition Arousal: Alert Behavior During Therapy: WFL for tasks assessed/performed   PT - Cognitive impairments: Memory, Safety/Judgement, Sequencing, Problem solving, Awareness                       PT - Cognition Comments: Pt with decreased insight into current condition. He demonstrated decreased safety awareness. Pt followed commands appropriately; however, had difficulty maintaining adjustments in technique. Following commands: Impaired Following commands impaired: Follows one step commands with increased time    Cueing Cueing Techniques: Verbal cues, Visual cues, Tactile cues  Exercises      General Comments General comments (skin integrity, edema, etc.): VSS on RA. Family present and supportive.      Pertinent Vitals/Pain Pain Assessment Pain Assessment: No/denies pain    Home Living                          Prior Function            PT Goals (current goals can now be found in the care plan section) Acute Rehab PT Goals Patient Stated Goal: Get better PT Goal Formulation: With patient Time For Goal Achievement: 08/18/24 Potential to Achieve Goals: Good Progress towards PT goals: Progressing toward goals    Frequency    Min 2X/week      PT Plan      Co-evaluation              AM-PAC PT 6 Clicks Mobility   Outcome Measure  Help needed turning from your back to your side while in a flat bed without using bedrails?: A Little Help needed moving from lying on your back to sitting on the side of a flat bed without using bedrails?: A Little Help needed moving to and from a bed to a chair (including a wheelchair)?: A Little Help needed standing up from a chair using your arms (e.g., wheelchair or bedside chair)?: A Little Help needed to  walk in hospital room?: A Lot Help needed climbing 3-5 steps with a railing? : A Lot 6 Click Score: 16    End of Session Equipment Utilized During Treatment: Gait belt Activity Tolerance: Patient tolerated treatment well Patient left: in chair;with chair alarm set;with call bell/phone within reach;with family/visitor present Nurse Communication: Mobility status PT Visit Diagnosis: Other abnormalities of gait and mobility (R26.89)     Time: 8940-8873 PT Time Calculation (min) (ACUTE ONLY): 27 min  Charges:    $Gait Training: 23-37 mins PT General Charges $$ ACUTE PT VISIT: 1 Visit                     Randall SAUNDERS, PT, DPT Acute Rehabilitation Services Office: [phone] Secure Chat Preferred  Delon CHRISTELLA Callander 08/10/2024, 12:21 PM

## 2024-08-11 DIAGNOSIS — I69391 Dysphagia following cerebral infarction: Secondary | ICD-10-CM | POA: Diagnosis not present

## 2024-08-11 DIAGNOSIS — E1142 Type 2 diabetes mellitus with diabetic polyneuropathy: Secondary | ICD-10-CM | POA: Diagnosis not present

## 2024-08-11 DIAGNOSIS — I10 Essential (primary) hypertension: Secondary | ICD-10-CM | POA: Diagnosis not present

## 2024-08-11 DIAGNOSIS — R1310 Dysphagia, unspecified: Secondary | ICD-10-CM | POA: Diagnosis not present

## 2024-08-11 DIAGNOSIS — E871 Hypo-osmolality and hyponatremia: Secondary | ICD-10-CM | POA: Diagnosis not present

## 2024-08-11 DIAGNOSIS — I4891 Unspecified atrial fibrillation: Secondary | ICD-10-CM | POA: Diagnosis not present

## 2024-08-11 DIAGNOSIS — I6200 Nontraumatic subdural hemorrhage, unspecified: Secondary | ICD-10-CM | POA: Diagnosis not present

## 2024-08-11 DIAGNOSIS — K5900 Constipation, unspecified: Secondary | ICD-10-CM | POA: Diagnosis not present

## 2024-08-11 DIAGNOSIS — J449 Chronic obstructive pulmonary disease, unspecified: Secondary | ICD-10-CM | POA: Diagnosis not present

## 2024-08-11 DIAGNOSIS — R918 Other nonspecific abnormal finding of lung field: Secondary | ICD-10-CM | POA: Diagnosis not present

## 2024-08-11 DIAGNOSIS — E785 Hyperlipidemia, unspecified: Secondary | ICD-10-CM | POA: Diagnosis not present

## 2024-08-11 DIAGNOSIS — E1151 Type 2 diabetes mellitus with diabetic peripheral angiopathy without gangrene: Secondary | ICD-10-CM | POA: Diagnosis not present

## 2024-08-11 LAB — BASIC METABOLIC PANEL WITH GFR
Anion gap: 12 (ref 5–15)
BUN: 26 mg/dL — ABNORMAL HIGH (ref 8–23)
CO2: 23 mmol/L (ref 22–32)
Calcium: 9.3 mg/dL (ref 8.9–10.3)
Chloride: 97 mmol/L — ABNORMAL LOW (ref 98–111)
Creatinine, Ser: 0.89 mg/dL (ref 0.61–1.24)
GFR, Estimated: >60 mL/min
Glucose, Bld: 216 mg/dL — ABNORMAL HIGH (ref 70–99)
Potassium: 3.9 mmol/L (ref 3.5–5.1)
Sodium: 132 mmol/L — ABNORMAL LOW (ref 135–145)

## 2024-08-11 LAB — CBC
HCT: 39.9 % (ref 39.0–52.0)
Hemoglobin: 13.6 g/dL (ref 13.0–17.0)
MCH: 31.3 pg (ref 26.0–34.0)
MCHC: 34.1 g/dL (ref 30.0–36.0)
MCV: 91.7 fL (ref 80.0–100.0)
Platelets: 179 10*3/uL (ref 150–400)
RBC: 4.35 MIL/uL (ref 4.22–5.81)
RDW: 15.3 % (ref 11.5–15.5)
WBC: 9.3 10*3/uL (ref 4.0–10.5)
nRBC: 0 % (ref 0.0–0.2)

## 2024-08-11 LAB — GLUCOSE, CAPILLARY
Glucose-Capillary: 217 mg/dL — ABNORMAL HIGH (ref 70–99)
Glucose-Capillary: 141 mg/dL — ABNORMAL HIGH (ref 70–99)
Glucose-Capillary: 134 mg/dL — ABNORMAL HIGH (ref 70–99)
Glucose-Capillary: 162 mg/dL — ABNORMAL HIGH (ref 70–99)

## 2024-08-11 LAB — SODIUM, URINE, RANDOM: Sodium, Ur: 85 mmol/L

## 2024-08-11 LAB — OSMOLALITY: Osmolality: 289 mosm/kg (ref 275–295)

## 2024-08-11 MED ORDER — SODIUM CHLORIDE 0.9 % IV BOLUS
1000.0000 mL | Freq: Once | INTRAVENOUS | Status: AC
  Administered 2024-08-11: 1000 mL via INTRAVENOUS

## 2024-08-11 MED ORDER — ENOXAPARIN SODIUM 40 MG/0.4ML IJ SOSY
40.0000 mg | PREFILLED_SYRINGE | INTRAMUSCULAR | Status: DC
  Administered 2024-08-12 – 2024-08-16 (×5): 40 mg via SUBCUTANEOUS
  Filled 2024-08-11 (×5): qty 0.4

## 2024-08-11 NOTE — Plan of Care (Signed)
" °  Problem: Education: Goal: Knowledge of disease or condition will improve Outcome: Not Progressing Goal: Knowledge of secondary prevention will improve (MUST DOCUMENT ALL) Outcome: Not Progressing Goal: Knowledge of patient specific risk factors will improve (DELETE if not current risk factor) Outcome: Not Progressing   Problem: Ischemic Stroke/TIA Tissue Perfusion: Goal: Complications of ischemic stroke/TIA will be minimized Outcome: Not Progressing   Problem: Coping: Goal: Will verbalize positive feelings about self Outcome: Not Progressing Goal: Will identify appropriate support needs Outcome: Not Progressing   Problem: Health Behavior/Discharge Planning: Goal: Ability to manage health-related needs will improve Outcome: Not Progressing Goal: Goals will be collaboratively established with patient/family Outcome: Not Progressing   Problem: Self-Care: Goal: Ability to participate in self-care as condition permits will improve Outcome: Not Progressing Goal: Verbalization of feelings and concerns over difficulty with self-care will improve Outcome: Not Progressing Goal: Ability to communicate needs accurately will improve Outcome: Not Progressing   Problem: Nutrition: Goal: Risk of aspiration will decrease Outcome: Not Progressing Goal: Dietary intake will improve Outcome: Not Progressing   Problem: Education: Goal: Knowledge of General Education information will improve Description: Including pain rating scale, medication(s)/side effects and non-pharmacologic comfort measures Outcome: Not Progressing   Problem: Health Behavior/Discharge Planning: Goal: Ability to manage health-related needs will improve Outcome: Not Progressing   Problem: Clinical Measurements: Goal: Ability to maintain clinical measurements within normal limits will improve Outcome: Not Progressing Goal: Will remain free from infection Outcome: Not Progressing Goal: Diagnostic test results will  improve Outcome: Not Progressing Goal: Respiratory complications will improve Outcome: Not Progressing Goal: Cardiovascular complication will be avoided Outcome: Not Progressing   Problem: Activity: Goal: Risk for activity intolerance will decrease Outcome: Not Progressing   Problem: Nutrition: Goal: Adequate nutrition will be maintained Outcome: Not Progressing   Problem: Coping: Goal: Level of anxiety will decrease Outcome: Not Progressing   Problem: Elimination: Goal: Will not experience complications related to bowel motility Outcome: Not Progressing Goal: Will not experience complications related to urinary retention Outcome: Not Progressing   Problem: Pain Managment: Goal: General experience of comfort will improve and/or be controlled Outcome: Not Progressing   Problem: Safety: Goal: Ability to remain free from injury will improve Outcome: Not Progressing   Problem: Skin Integrity: Goal: Risk for impaired skin integrity will decrease Outcome: Not Progressing   Problem: Education: Goal: Ability to describe self-care measures that may prevent or decrease complications (Diabetes Survival Skills Education) will improve Outcome: Not Progressing Goal: Individualized Educational Video(s) Outcome: Not Progressing   Problem: Coping: Goal: Ability to adjust to condition or change in health will improve Outcome: Not Progressing   Problem: Fluid Volume: Goal: Ability to maintain a balanced intake and output will improve Outcome: Not Progressing   Problem: Health Behavior/Discharge Planning: Goal: Ability to identify and utilize available resources and services will improve Outcome: Not Progressing Goal: Ability to manage health-related needs will improve Outcome: Not Progressing   "

## 2024-08-11 NOTE — Progress Notes (Signed)
 Occupational Therapy Treatment Patient Details Name: Mark Dickerson MRN: [id number] DOB: Oct 23, 1945 Today's Date: 08/11/2024   History of present illness Pt is 79 y/o male presenting on 08/03/24 with slurred speech, R facial droop since 1/19. Found with small volume subdural hematoma along the falx, also with L basal ganglia infarct. Repeat CT stable. PMH includes: DM, HTN, MI, PAD.   OT comments  Pt seen this AM for OT treatment. Pt initially lethargic, but wakefulness improving with EOB/OOB mobility & multisensory stim. AOX3, re-orientated appropriately. Pt with notable cognitive impairments related to attention, problem solving, initiation, and sequencing. He required min A for transfers and up to mod A for stability as he fatigued standing sinkside. Heavy R lateral lean in stance with poor awareness of this. Pt requiring max A to locate proper grooming supplies from R side of countertop from cluttered space; also needs A to accurately identify correct items (pt misnaming toothbrush for toothpaste). Incontinent of stool in stance, needs total A for hygiene. RN in room to assist and present upon OT departure. No changes in d/c recommendations (post-acute rehab <3 hrs/day). OT to continue to follow.      If plan is discharge home, recommend the following:  A lot of help with walking and/or transfers;A lot of help with bathing/dressing/bathroom;Assistance with cooking/housework;Direct supervision/assist for medications management;Direct supervision/assist for financial management;Assist for transportation;Help with stairs or ramp for entrance;Supervision due to cognitive status   Equipment Recommendations  Other (comment) (defer to next level of care)    Recommendations for Other Services      Precautions / Restrictions Precautions Precautions: Fall Recall of Precautions/Restrictions: Intact Precaution/Restrictions Comments: Cortrak Restrictions Weight Bearing Restrictions Per Provider  Order: No       Mobility Bed Mobility Overal bed mobility: Needs Assistance Bed Mobility: Supine to Sit, Sit to Supine     Supine to sit: Mod assist, HOB elevated, Used rails Sit to supine: Mod assist, HOB elevated, Used rails   General bed mobility comments: pt needs cues to initiate bringing LE's towards EOB and A for trunk elevation/seated scooting to EOB.    Transfers Overall transfer level: Needs assistance Equipment used: Rolling walker (2 wheels) Transfers: Sit to/from Stand Sit to Stand: Min assist           General transfer comment: Stood from bed with cues for hand placement, ambulated in room with RW and min-mod A, increased assist needed as he fatigued. Poor maintenance of proximity to AD and leans heavily to the R.     Balance Overall balance assessment: Needs assistance Sitting-balance support: Single extremity supported, Bilateral upper extremity supported, Feet supported Sitting balance-Leahy Scale: Fair Sitting balance - Comments: seated EOB, intermittent CGA; balance improved when holding onto bed rail Postural control: Right lateral lean Standing balance support: Bilateral upper extremity supported, During functional activity, Reliant on assistive device for balance Standing balance-Leahy Scale: Poor Standing balance comment: reliant on RW, heavily leaning to the R side                           ADL either performed or assessed with clinical judgement   ADL Overall ADL's : Needs assistance/impaired     Grooming: Standing;Oral care;Moderate assistance Grooming Details (indicate cue type and reason): extensive sequencing cues for simulated oral care, needs A to locate all necessary grooming supplies from cluttered R side of countertop and assist to properly identify oral hygiene supplies     Lower Body Bathing: Total  assistance Lower Body Bathing Details (indicate cue type and reason): assist to wipe inner lower legs following bowel  incontinence         Toilet Transfer: Minimal assistance;Ambulation;BSC/3in1;Rolling walker (2 wheels) Toilet Transfer Details (indicate cue type and reason): cues for safe approach to [facility] and to use arm rests to power up Toileting- Clothing Manipulation and Hygiene: Total assistance Toileting - Clothing Manipulation Details (indicate cue type and reason): posterior peri hygiene following bowel incontinence     Functional mobility during ADLs: Minimal assistance;Moderate assistance;Rolling walker (2 wheels)      Extremity/Trunk Assessment              Vision       Perception Perception Perception: Impaired Preception Impairment Details: Inattention/Neglect Perception-Other Comments: pt con't with R inattention to body and environment   Praxis     Communication Communication Communication: Impaired Factors Affecting Communication: Reduced clarity of speech (dysarthric)   Cognition Arousal: Alert, Lethargic (fluctuating) Behavior During Therapy: Flat affect Cognition: Cognition impaired   Orientation impairments: Situation (re-orientation provided) Awareness: Intellectual awareness impaired Memory impairment (select all impairments): Short-term memory, Working civil service fast streamer, Engineer, structural memory Attention impairment (select first level of impairment): Focused attention, Sustained attention Executive functioning impairment (select all impairments): Initiation, Organization, Sequencing, Problem solving OT - Cognition Comments: wakefulness improving with OOB mobility and multisensory stim; pt follows 1-step commands with incr time, poor insight, judgement, and safety awareness                 Following commands: Impaired Following commands impaired: Follows one step commands with increased time      Cueing   Cueing Techniques: Verbal cues, Visual cues, Tactile cues  Exercises      Shoulder Instructions       General Comments VSS throughout; RN in room  upon departure    Pertinent Vitals/ Pain       Pain Assessment Pain Assessment: No/denies pain  Home Living                                          Prior Functioning/Environment              Frequency  Min 2X/week        Progress Toward Goals  OT Goals(current goals can now be found in the care plan section)  Progress towards OT goals: Progressing toward goals     Plan      Co-evaluation                 AM-PAC OT 6 Clicks Daily Activity     Outcome Measure   Help from another person eating meals?: A Little Help from another person taking care of personal grooming?: A Lot Help from another person toileting, which includes using toliet, bedpan, or urinal?: Total Help from another person bathing (including washing, rinsing, drying)?: A Lot Help from another person to put on and taking off regular upper body clothing?: A Lot Help from another person to put on and taking off regular lower body clothing?: Total 6 Click Score: 11    End of Session Equipment Utilized During Treatment: Gait belt;Rolling walker (2 wheels)  OT Visit Diagnosis: Unsteadiness on feet (R26.81);Other abnormalities of gait and mobility (R26.89);Muscle weakness (generalized) (M62.81)   Activity Tolerance Patient tolerated treatment well   Patient Left in bed;with call bell/phone within reach;with bed alarm set;with nursing/sitter in  room   Nurse Communication Mobility status        Time: 8854-8782 OT Time Calculation (min): 32 min  Charges: OT General Charges $OT Visit: 1 Visit OT Treatments $Self Care/Home Management : 23-37 mins  Malaijah Houchen M. [place], OTR/L [facility] [phone] Secure Chat Preferred  Rikki Burma 08/11/2024, 3:29 PM

## 2024-08-11 NOTE — Progress Notes (Signed)
 "  HD#8 SUBJECTIVE:  Patient Summary: Mark Dickerson is an independently-living (with family support) 79 y.o. 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   Overnight Events:  Yesterday evening patient was restless and wanting to transfer from bed to chair repeatedly. Patient was alert and oriented x3 per nursing. Overnight the patient was pulling at telemetry lines and removing catheter. Placed in mittens. No new or worsening neuro deficits at that time. Metoprolol  switched back to liquid suspension, miscommunication regarding tube vs oral administration.  Interim History: Patient reports that he doesn't feel as restless or antsy as he did yesterday. Not in any pain, no difficulties breathing. No new weakness or other concerning symptoms that he wants to talk about. States that he hasn't eaten breakfast yet this AM. Overall he hasn't been eating much because he hasn't been hungry much. Does not report much difficulty with the feeds in terms of swallowing or coughing afterwards. He reports that he was able to get some sleep last night.  OBJECTIVE:  Vital Signs: Vitals:   08/10/24 2230 08/11/24 0000 08/11/24 0430 08/11/24 0433  BP: 135/80 97/66 121/60   Pulse: 86 73 (!) 58   Resp:  20 20   Temp:  99.1 F (37.3 C) 98.4 F (36.9 C)   TempSrc:  Axillary Axillary   SpO2:  96% 98%   Weight:    83.3 kg  Height:       Supplemental O2: Room Air SpO2: 98 %  Filed Weights   08/09/24 0638 08/10/24 0557 08/11/24 0433   Weight: 81.7 kg 81.6 kg 83.3 kg     Intake/Output Summary (Last 24 hours) at 08/11/2024 0630 Last data filed at 08/11/2024 0540 Gross per 24 hour  Intake 650 ml  Output 2200 ml  Net -1550 ml   Net IO Since Admission: 491.66 mL [08/11/24 0630]  Physical Exam: Constitutional: well-appearing, elderly male in no acute distress HENT: normocephalic atraumatic, Dobhoff tube in place via right nare Eyes: conjunctiva non-erythematous, PERRL, no scleral icterus Cardiovascular: irregularly irregular rhythm with regular rate, no m/r/g Pulmonary/Chest: normal work of breathing on room air, anterior lung fields clear to auscultation bilaterally Abdominal: soft, non-tender, non-distended, bowel sounds normal MSK: normal bulk and tone Neurological: alert & oriented to name, place (hospital, [place]), and season (winter) but not year, mild right facial droop upon smile, otherwise CN II-XII intact.  5/5 strength in bilateral upper and lower extremities. Skin: warm and dry, decreased skin turgor to arm + anterior neck Extremities: no edema or cyanosis; peripheral pulses intact Psych: normal mood and affect, thought content normal   Patient Lines/Drains/Airways Status     Active Line/Drains/Airways     Name Placement date Placement time Site Days   Peripheral IV 08/03/24 20 G Anterior;Right Forearm 08/03/24  1025  Forearm  8   External Urinary Catheter 08/07/24  1957  --  4   Small Bore Feeding Tube 10 Fr. Left nare Marking at nare/corner of mouth 85 cm 08/05/24  1524  Left nare  6            Pertinent labs and  imaging:      Latest Ref Rng & Units 08/11/2024    1:12 AM 08/10/2024    1:18 AM 08/07/2024    7:40 AM  CBC  WBC 4.0 - 10.5 K/uL 9.3  10.2  8.4   Hemoglobin 13.0 - 17.0 g/dL 86.3  85.8  84.5   Hematocrit 39.0 - 52.0 % 39.9  40.7  44.7   Platelets 150 - 400 K/uL 179  194  191        Latest Ref Rng & Units 08/11/2024    1:12 AM 08/10/2024    1:18 AM 08/09/2024    5:37 AM   CMP  Glucose 70 - 99 mg/dL 783  850  858   BUN 8 - 23 mg/dL 26  22  19    Creatinine 0.61 - 1.24 mg/dL 9.10  9.22  9.17   Sodium 135 - 145 mmol/L 132  134  134   Potassium 3.5 - 5.1 mmol/L 3.9  4.1  3.8   Chloride 98 - 111 mmol/L 97  99  98   CO2 22 - 32 mmol/L 23  24  25    Calcium  8.9 - 10.3 mg/dL 9.3  9.5  89.9     ASSESSMENT/PLAN:  Assessment: Principal Problem:   Acute stroke of basal ganglia (HCC) Active Problems:   Hypertension   Hyperlipidemia   Type 2 diabetes mellitus with complication, without long-term current use of insulin  (HCC)   Subdural hematoma (HCC)   Atrial fibrillation (HCC)   Malnutrition of moderate degree   SDH (subdural hematoma) (HCC)   Acute ischemic stroke (HCC)   Plan: #Acute stroke of left basal ganglia w/ severe dysphagia #Concern for Malnutrition Stable compared to yesterday, overall improving from admission.  Patient had acute infarct to left basal ganglia which was seen on initial MRI. A1c optimized, LDL not at goal so was started on high-intensity statin. Patient continues to be in rate-controlled atrial fibrillation (newly onset this admission) but no definitive embolic source found on TTE. Holding DAPT d/t prior SDH. Will continue with daily neuro exams. Dobhoff tube still in place, awaiting RD assistance with titrating feeds down to allow adequate trial of oral diet. Appears volume down on exam, will give additional liter bolus. SLP may repeat MBS, potentially tomorrow. If improvement, will advance diet.  PT recommending CIR but patient is not a candidate due to lack of 24/7 support, now pursuing SNF. Appreciate SLP and RD assistance. - Continue Crestor  20 mg daily - Hold antiplatelet therapy pending repeat CT head (2/5-2/12) - SBP goal < 160 - Outpatient Stroke clinic follow up - Continue Cortrak with dysphagia 1 diet pending SLP re-eval - Appreciate RD help with tube feeds - Free water  150 ml q6h - 1L NS bolus   #Delirium On 1/28 started  to become restless and agitated per nursing. No change in neuro exam, alert and oriented x 3. Now he is A&O to name, place, season, but not year. Not restless during rounds this AM. No infectious signs/symptoms or new deficits. Daily bowel movements charted after initiation of bowel regimen. Suspect hospital delirium given prolonged hospitalization; post-stroke mental status changes less likely given timeline and course of symptoms. Removing telemetry to reduce deliriogenic factors. Suspect that delirium will improve as care progresses and additional lines can be removed. - Delirium precautions - Discontinue cardiac monitoring - Seroquel  25 mg at bedtime PRN for restlessness/agitation - Hydroxyzine  25 mg tid prn for anxiety - Continue nightly melatonin 3 mg  #Subdural hematoma #Probable  fall on antiplatelet therapy #History of Falls  No new falls during hospitalization. Most recently suspected immediately prior to admission. No new symptoms or signs indicative of increased ICP/progression of SDH. No new neuro deficits, exam consistent with prior days. Still plan to repeat [facility] on original timeline recommended by neurology. - Repeat CT scan of the brain in 2-3 weeks to assess SDH (around 2/5-2/12) - Hold antiplatelet therapy until repeat CT scan shows near resolution of SDH - PT/OT eval and treat  #New-onset atrial fibrillation without RVR  Patient is in A-fib without RVR. No prior history of A-fib or anticoagulation. CHA2DS2-VASc of 7. Rate control with metoprolol . Holding AC d/t SDH. Stopping telemetry as rates have been controlled with beta blocker and this is likely contributing to delirium. - Discontinue cardiac monitoring - Increase metoprolol  tartrate to 12.5 mg bid per tube  #Hyponatremia Na mildly decreased this morning at 132 but corrected value of 134 (glucose 216). Suspect hypovolemic but has also been receiving hypotonic fluids via tube. Initiating further workup to better  characterize etiology. Will continue trending renal function and holding hydrochlorothiazide. Giving additional liter of IVF. - Trend BMPs - Serum and urine osmolality pending - Urine sodium pending - NS 1L bolus  #Hypertension  Pressures better controlled. Will continue current regimen minus hydrochlorothiazide due to concern for hyponatremia. Initial SBP goal < 160 but long-term goal of normotension per neurology. - Continue amlodipine  10 mg daily per tube - Continue home benazepril  40 mg daily per tube - Increase metoprolol  tartrate to 12.5 mg bid per tube - IV Labetalol  q2h PRN for SBP > 170 or DBP > 110 - Hold home hydrochlorothiazide  #Type 2 diabetes mellitus, non-insulin  dependent, controlled, complicated by peripheral neuropathy and large vessel disease Patient has diabetes well controlled with metformin 500 mg daily. A1c 6.1% this admission with CBG ranging from 100-200s on sensitive SSI.  - Hold home metformin 500 mg daily - Sensitive SSI  #Hypokalemia, resolved Prior hypokalemia may have been d/t poor oral intake. Dobhoff now in place, will continue monitoring as patient advances diet and feeding tube is removed. 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 Patient declined biopsy and treatment, and denies experiencing any pain or difficulty breathing due to COPD or mass. He is not in any respiratory distress and has been oxygenating well on room air. - Home albuterol  inhaler prn - Robitussin 5 ml q4h PRN for cough/loosen phlegm - Nicotine  patch 14 g daily  #PAD (with multiple LE revascularization procedures) #History of NSTEMI (post-procedure) - Hold home antiplatelet therapy per recommendations above  #HLD - Continue Crestor  20 mg daily  #Constipation - MiraLAX  17 g per  tube twice daily - Sorbitol  70% solution 15 mL per tube daily as needed   Best Practice: Diet: Dysphagia I, moderately thick liquids (Dobhoff in place) IVF: Fluids: 0.9NS, Rate: 1000 cc bolus VTE: enoxaparin  (LOVENOX ) injection 40 mg Start: 08/09/24 1400 Place and maintain sequential compression device Start: 08/03/24 1848 Code: Full  Disposition planning: Therapy Recs: CIR, DME: none Family Contact: brother and sister-in-law, to be notified. DISPO: Anticipated discharge in 1-2 days to SNF pending insurance auth and removal of Dobhoff.  Signature:  Letha Cheadle, MD Internal Medicine Resident  PGY-1 6:30 AM, 08/11/2024  On Call pager [phone]  "

## 2024-08-12 ENCOUNTER — Inpatient Hospital Stay (HOSPITAL_COMMUNITY)

## 2024-08-12 DIAGNOSIS — I69391 Dysphagia following cerebral infarction: Secondary | ICD-10-CM | POA: Diagnosis not present

## 2024-08-12 DIAGNOSIS — I10 Essential (primary) hypertension: Secondary | ICD-10-CM | POA: Diagnosis not present

## 2024-08-12 DIAGNOSIS — E1142 Type 2 diabetes mellitus with diabetic polyneuropathy: Secondary | ICD-10-CM | POA: Diagnosis not present

## 2024-08-12 DIAGNOSIS — K5900 Constipation, unspecified: Secondary | ICD-10-CM | POA: Diagnosis not present

## 2024-08-12 DIAGNOSIS — F17210 Nicotine dependence, cigarettes, uncomplicated: Secondary | ICD-10-CM | POA: Diagnosis not present

## 2024-08-12 DIAGNOSIS — J449 Chronic obstructive pulmonary disease, unspecified: Secondary | ICD-10-CM | POA: Diagnosis not present

## 2024-08-12 DIAGNOSIS — I4891 Unspecified atrial fibrillation: Secondary | ICD-10-CM | POA: Diagnosis not present

## 2024-08-12 DIAGNOSIS — E871 Hypo-osmolality and hyponatremia: Secondary | ICD-10-CM | POA: Diagnosis not present

## 2024-08-12 DIAGNOSIS — E1151 Type 2 diabetes mellitus with diabetic peripheral angiopathy without gangrene: Secondary | ICD-10-CM | POA: Diagnosis not present

## 2024-08-12 DIAGNOSIS — I6200 Nontraumatic subdural hemorrhage, unspecified: Secondary | ICD-10-CM | POA: Diagnosis not present

## 2024-08-12 DIAGNOSIS — R1310 Dysphagia, unspecified: Secondary | ICD-10-CM | POA: Diagnosis not present

## 2024-08-12 LAB — CBC
HCT: 42.6 % (ref 39.0–52.0)
Hemoglobin: 14.5 g/dL (ref 13.0–17.0)
MCH: 31.5 pg (ref 26.0–34.0)
MCHC: 34 g/dL (ref 30.0–36.0)
MCV: 92.6 fL (ref 80.0–100.0)
Platelets: 186 10*3/uL (ref 150–400)
RBC: 4.6 MIL/uL (ref 4.22–5.81)
RDW: 15.4 % (ref 11.5–15.5)
WBC: 9.4 10*3/uL (ref 4.0–10.5)
nRBC: 0 % (ref 0.0–0.2)

## 2024-08-12 LAB — BASIC METABOLIC PANEL WITH GFR
Anion gap: 12 (ref 5–15)
BUN: 27 mg/dL — ABNORMAL HIGH (ref 8–23)
CO2: 22 mmol/L (ref 22–32)
Calcium: 10.1 mg/dL (ref 8.9–10.3)
Chloride: 100 mmol/L (ref 98–111)
Creatinine, Ser: 0.89 mg/dL (ref 0.61–1.24)
GFR, Estimated: >60 mL/min
Glucose, Bld: 154 mg/dL — ABNORMAL HIGH (ref 70–99)
Potassium: 4.3 mmol/L (ref 3.5–5.1)
Sodium: 134 mmol/L — ABNORMAL LOW (ref 135–145)

## 2024-08-12 LAB — GLUCOSE, CAPILLARY
Glucose-Capillary: 178 mg/dL — ABNORMAL HIGH (ref 70–99)
Glucose-Capillary: 153 mg/dL — ABNORMAL HIGH (ref 70–99)
Glucose-Capillary: 136 mg/dL — ABNORMAL HIGH (ref 70–99)
Glucose-Capillary: 119 mg/dL — ABNORMAL HIGH (ref 70–99)

## 2024-08-12 LAB — OSMOLALITY, URINE: Osmolality, Ur: 482 mosm/kg (ref 300–900)

## 2024-08-12 MED ORDER — GUAIFENESIN 100 MG/5ML PO LIQD: 5.0000 mL | ORAL | Status: DC | PRN

## 2024-08-12 MED ORDER — OSMOLITE 1.5 CAL PO LIQD: 750.0000 mL | ORAL | Status: DC

## 2024-08-12 MED ORDER — METOPROLOL TARTRATE 12.5 MG HALF TABLET
12.5000 mg | ORAL_TABLET | Freq: Two times a day (BID) | ORAL | Status: DC
  Administered 2024-08-12 – 2024-08-17 (×9): 12.5 mg via ORAL
  Filled 2024-08-12 (×10): qty 1

## 2024-08-12 MED ORDER — ROSUVASTATIN CALCIUM 20 MG PO TABS
20.0000 mg | ORAL_TABLET | Freq: Every day | ORAL | Status: DC
  Administered 2024-08-13 – 2024-08-17 (×5): 20 mg via ORAL
  Filled 2024-08-12 (×5): qty 1

## 2024-08-12 MED ORDER — POLYETHYLENE GLYCOL 3350 17 G PO PACK
17.0000 g | PACK | Freq: Two times a day (BID) | ORAL | Status: DC
  Administered 2024-08-12 – 2024-08-17 (×6): 17 g via ORAL
  Filled 2024-08-12 (×7): qty 1

## 2024-08-12 MED ORDER — SORBITOL 70 % SOLN: 15.0000 mL | Freq: Every day | Status: DC | PRN

## 2024-08-12 MED ORDER — AMLODIPINE BESYLATE 5 MG PO TABS
10.0000 mg | ORAL_TABLET | Freq: Every day | ORAL | Status: DC
  Administered 2024-08-13 – 2024-08-17 (×4): 10 mg via ORAL
  Filled 2024-08-12 (×5): qty 2

## 2024-08-12 MED ORDER — BENAZEPRIL HCL 20 MG PO TABS
40.0000 mg | ORAL_TABLET | Freq: Every day | ORAL | Status: DC
  Administered 2024-08-13 – 2024-08-17 (×4): 40 mg via ORAL
  Filled 2024-08-12 (×6): qty 2

## 2024-08-12 MED ORDER — SODIUM CHLORIDE 0.9 % IV BOLUS
1000.0000 mL | Freq: Once | INTRAVENOUS | Status: AC
  Administered 2024-08-12: 1000 mL via INTRAVENOUS

## 2024-08-12 NOTE — Progress Notes (Signed)
 Physical Therapy Treatment Patient Details Name: Mark Dickerson MRN: [id number] DOB: 1945/11/12 Today's Date: 08/12/2024   History of Present Illness Pt is 79 y/o male presenting on 08/03/24 with slurred speech, R facial droop since 1/19. Found with small volume subdural hematoma along the falx, also with L basal ganglia infarct. Repeat CT stable. PMH includes: DM, HTN, MI, PAD.    PT Comments  Pt with increased confusion this date. He only followed ~10% of commands. Overall, pt required modA for  safety and stability. He continues to be limited by impaired cognition and poor safety awareness. Pt veered to the right during ambulation in the hallway and ran into objects/obstacles on his right side. Unable to correct technique in response to multi-modal cues. PT facilitated standing rest breaks to reset form prior to advancing. Patient will benefit from continued inpatient follow up therapy, <3 hours/day.     If plan is discharge home, recommend the following: A lot of help with bathing/dressing/bathroom;Assistance with cooking/housework;Assist for transportation;Help with stairs or ramp for entrance;A lot of help with walking and/or transfers;Supervision due to cognitive status   Can travel by private vehicle     Yes  Equipment Recommendations  BSC/3in1    Recommendations for Other Services       Precautions / Restrictions Precautions Precautions: Fall Recall of Precautions/Restrictions: Intact Precaution/Restrictions Comments: Cortrak Restrictions Weight Bearing Restrictions Per Provider Order: No     Mobility  Bed Mobility Overal bed mobility: Needs Assistance Bed Mobility: Supine to Sit, Sit to Supine     Supine to sit: Mod assist, HOB elevated, Used rails Sit to supine: Mod assist, HOB elevated, Used rails   General bed mobility comments: Pt sat up on R side of bed. Cues and light assist to bring BLE towards EOB. Assist to elevate trunk. Returning to bed assist to bring  BLE back into bed and roll back onto his back.    Transfers Overall transfer level: Needs assistance Equipment used: Rolling walker (2 wheels) Transfers: Sit to/from Stand Sit to Stand: Mod assist           General transfer comment: Pt stood from lowest bed height. Cued proper hand placement using RW. Powered up with modA to stand. Instructed pt to come up along EOB to get higher in bed prior to sitting. Max cues and modA to physical manuever RW to aid pt in turning. Fair eccentric control with sitting.    Ambulation/Gait Ambulation/Gait assistance: Mod assist Gait Distance (Feet): 60 Feet (x4, standing rest break between bouts) Assistive device: Rolling walker (2 wheels) Gait Pattern/deviations: Step-to pattern, Decreased stride length, Decreased dorsiflexion - right, Trunk flexed, Drifts right/left Gait velocity: Decreased Gait velocity interpretation: <1.8 ft/sec, indicate of risk for recurrent falls   General Gait Details: Pt ambulated with blue TheraBand applied around pelvis and attached to either side of RW to improve proximity to AD. Pt took short steps with a higher cadence. He demonstrated right inattention, running into objects/obstacles despite max cues for visual scanning and to improve safety awareness. ModA to maneuver AD. Pt continues to demonstrate a fwd lean and attempts to push AD too far infront of him, the resistance band aided with closeness to device. Pt veered to the right in the hallway, modA to maintain a straight path fwd. As pt fatigued, he became increasingly unsteady as RLE would get behind him and he'd continue to move faster. Facilitated standing rest break to reset pt's position and review corrections. Pt unable to adapt as effectively  compared to prior sessions.   Stairs             Wheelchair Mobility     Tilt Bed    Modified Rankin (Stroke Patients Only)       Balance Overall balance assessment: Needs assistance Sitting-balance  support: Single extremity supported, Bilateral upper extremity supported, Feet supported Sitting balance-Leahy Scale: Fair Sitting balance - Comments: seated EOB, intermittent CGA Postural control: Right lateral lean Standing balance support: Bilateral upper extremity supported, During functional activity, Reliant on assistive device for balance Standing balance-Leahy Scale: Poor Standing balance comment: Pt dependent on RW, heavily leaning to the R side                            Communication Communication Communication: Impaired Factors Affecting Communication: Reduced clarity of speech (dysarthric)  Cognition Arousal: Alert Behavior During Therapy: Flat affect, Impulsive   PT - Cognitive impairments: Memory, Safety/Judgement, Sequencing, Problem solving, Awareness, Orientation, Attention   Orientation impairments: Place, Time, Situation (Pt unaware he was in the hospital or why. Unsure of date.)                   PT - Cognition Comments: Pt slightly impulsive, quick to attempt movements before bed rail was lowered. Pt with increased confusion and decreased command following. He was unable to perform BLE exercises despite cues, visual demonstration, and hands-on assist to aid him in the movement. Pt with decreased comphresion. He continues to demonstrate right inattention, running into objects/obstacles. Following commands: Impaired Following commands impaired: Follows one step commands with increased time, Follows one step commands inconsistently    Cueing Cueing Techniques: Verbal cues, Visual cues, Tactile cues  Exercises      General Comments        Pertinent Vitals/Pain Pain Assessment Pain Assessment: Faces Faces Pain Scale: No hurt Pain Intervention(s): Monitored during session    Home Living                          Prior Function            PT Goals (current goals can now be found in the care plan section) Acute Rehab PT  Goals Patient Stated Goal: Return Home PT Goal Formulation: With patient Time For Goal Achievement: 08/18/24 Potential to Achieve Goals: Good Progress towards PT goals: Progressing toward goals    Frequency    Min 2X/week      PT Plan      Co-evaluation              AM-PAC PT 6 Clicks Mobility   Outcome Measure  Help needed turning from your back to your side while in a flat bed without using bedrails?: A Lot Help needed moving from lying on your back to sitting on the side of a flat bed without using bedrails?: A Lot Help needed moving to and from a bed to a chair (including a wheelchair)?: A Lot Help needed standing up from a chair using your arms (e.g., wheelchair or bedside chair)?: A Lot Help needed to walk in hospital room?: A Lot Help needed climbing 3-5 steps with a railing? : A Lot 6 Click Score: 12    End of Session Equipment Utilized During Treatment: Gait belt Activity Tolerance: Patient tolerated treatment well;Patient limited by fatigue Patient left: in bed;with bed alarm set;with call bell/phone within reach;Other (comment) (with mittens reapplied) Nurse Communication: Mobility status  PT Visit Diagnosis: Other abnormalities of gait and mobility (R26.89)     Time: 1620-1650 PT Time Calculation (min) (ACUTE ONLY): 30 min  Charges:    $Gait Training: 23-37 mins PT General Charges $$ ACUTE PT VISIT: 1 Visit                     Randall SAUNDERS, PT, DPT Acute Rehabilitation Services Office: [phone] Secure Chat Preferred  Delon CHRISTELLA Callander 08/12/2024, 5:30 PM

## 2024-08-12 NOTE — Evaluation (Signed)
 Modified Barium Swallow Study  Patient Details  Name: Mark Dickerson MRN: [id number] Date of Birth: 1946/01/18  Today's Date: 08/12/2024  Modified Barium Swallow completed.  Full report located under Chart Review in the Imaging Section.  History of Present Illness Pt is a 79 y/o male presenting on 08/03/24 with slurred speech, R facial droop, and difficulty swallowing since 1/19. Found with small volume subdural hematoma along the falx, also with L basal ganglia infarct. Repeat CT stable. MBS 1/22 showed sensed aspiration of thin and nectar-thick liquids. Pt was unable to clear penetrate due to weak cough. Puree and honey-thick diet ordered. Pt w/ s/s of aspiration at bedside on 1/23, diet changed to NPO with Cortrak. Puree and honey-thick diet resumed 1/26. Cortrak removed 1/30.     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   Clinical Impression Pt continues to display an oral and pharyngeal dysphagia s/p acute stroke, although current performance also exacerbated by mentation. Continue current diet of Dys 1/Moderately thick liquids as pt displayed overt aspiration of thinner liquids and decreased oral swallow safety and efficiency. SLP f/u as able to determine if PO texture upgrade is appropriate, pending improvement in cognition and swallowing. Note that physician is also having conversation regarding overall goals of care.   Pt displayed decline in mentation since previous session with SLP. Pt continues to display oral impairments across all domains; continue to suspect that deficits in tongue control, lingual motion, and amount/location of oral residue are related to motor impairments of CN VII and XII. Pt was unable to provide oral pressure needed to use straw this MBS, compared to previous ability during prior MBS. Pt had penetration without ejection of thin liquid, however material remained above the vocal folds (PAS 3). Pt displayed  moderate aspiration of mildly-thick liquids as a result of ongoing inadequate pharyngeal coordination and incomplete laryngeal vestibule closure (PAS 7). Pt sensed aspiration events, however was unable to clear material due to weak cough.  Pt had penetration of purees, however material was ejected out in the final stages of his swallow (PAS 2). Pt displayed no mastication or oral clearance of solid, despite manipulating and clearing barium from atop the solid. SLP removed solid from pt's mouth. Honey-thick liquids and pill not administered due to pt's mentation and poor participation. MBS limited by poor participation, limited visibility, excessive movement, and poor positioning.   Factors that may increase risk of adverse event in presence of aspiration Noe & Lianne 2021): Reduced cognitive function;Weak cough;Inadequate oral hygiene;Limited mobility;Aspiration of thick, dense, and/or acidic materials;Poor general health and/or compromised immunity;Respiratory or GI disease;Dependence for feeding and/or oral hygiene  Swallow Evaluation Recommendations Recommendations: PO diet PO Diet Recommendation: Dysphagia 1 (Pureed);Moderately thick liquids (Level 3, honey thick) Liquid Administration via: Spoon Medication Administration: Crushed with puree Supervision: Full supervision/cueing for swallowing strategies;Staff to assist with self-feeding Swallowing strategies  : Minimize environmental distractions;Slow rate;Small bites/sips;Check for pocketing or oral holding Postural changes: Position pt fully upright for meals;Stay upright 30-60 min after meals Oral care recommendations: Oral care BID (2x/day) Caregiver Recommendations: Avoid jello, ice cream, thin soups, popsicles;Remove water  pitcher;Have oral suction available      Rocky Quan, Student SLP  08/12/2024,4:45 PM

## 2024-08-12 NOTE — TOC Progression Note (Signed)
 Transition of Care [facility]) - Progression Note    Patient Details  Name: Mark Dickerson MRN: [id number] Date of Birth: 09-Sep-1945  [facility] of [facility]) CM/SW Contact  [name], [name] Phone Number: 08/12/2024, 10:46 AM  Clinical Narrative:   CSW following for disposition. Patient continues with cortrak, scheduled for MBS today. CSW to follow for SNF placement once nutrition plan is in place.     Expected Discharge Plan: Skilled Nursing Facility Barriers to Discharge: Continued Medical Work up, English As A Second Language Teacher               Expected Discharge Plan and Services   Discharge Planning Services: CM Consult Post Acute Care Choice: IP Rehab Living arrangements for the past 2 months: Single Family Home                                       Social Drivers of Health (SDOH) Interventions SDOH Screenings   Food Insecurity: Patient Declined (08/04/2024)  Housing: Unknown (08/04/2024)  Transportation Needs: Patient Declined (08/04/2024)  Utilities: Patient Declined (08/04/2024)  Social Connections: Patient Declined (08/04/2024)  Tobacco Use: High Risk (09/17/2023)   Received from [facility]    Readmission Risk Interventions     No data to display

## 2024-08-12 NOTE — Progress Notes (Addendum)
 Initial Nutrition Assessment  DOCUMENTATION CODES:   Non-severe (moderate) malnutrition in context of chronic illness  INTERVENTION:  Discontinue Cortrak and TF regimen, per MD, for pending MBS Monitor SLP notes for diet advancement/tolerance   Monitor GOC discussions and modify plan of care as indicated If aggressive nutrition care preferred and unable to advance diet, recommend longer-term nutrition support via PEG Continue Magic cup TID with meals, each supplement provides 290 kcal and 9 grams of protein  Initiate calorie count to assess intake and need for continued nutrition support   NUTRITION DIAGNOSIS:  Moderate Malnutrition related to chronic illness as evidenced by moderate muscle depletion, severe muscle depletion, moderate fat depletion. 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 Admitted with slurred speech and R facial droop, diagnosed stroke and SDH.  1/21 admitted 1/22 advanced to DYS 1, honey thick 1/23 made NPO after repeated coughing episodes, Cortrak placed, TF initiated 1/27 diet adv DYS1/HTL; TF modified to nocturnal regimen to maximize PO intake 1/29 TF volume further decreased to maximize PO intake, MBS scheduled   Patient restless yesterday. Discussed nutrition plan with resident and speech. Patient noted with continued inadequate intake. Care team requesting tube feeds be decreased to maximize intake. MBS scheduled for today.  Discussed regimen adjustment to nocturnal feedings two days prior when diet advanced. Planned to decrease further, however noted MD has decided to d/c Cortrak and tube feeds in  preparation for MBS this afternoon to allow for best chance in passing MBS. Continued concern for ability to maintain nutrition and hydration status until diet advanced as patient reports not preferring diet texture and at risk for dehydration 2/2 honey-thickened liquids. Speech in agreement.  Low sodium noted yesterday and IV fluids ordered. FWF increased, per MD. Hopefully diet will be advanced and progression toward discharge continues.   Continue to recommend short term enteral nutrition to aid in meeting calorie and protein needs while pt continues to work with SLP for diet advancements. If pt able to be advanced, recommend adding oral supplements to augment intake. MD has begun conversations around PEG placement should diet not be able to be advanced. Will follow along and modify plan of care as indicated.   Admit weight: 84.4 kg (first measured)  Current weight: 80.8 kg   Intake/Output Summary (Last 24 hours) at 08/12/2024 0859 Last data filed at 08/12/2024 0616 Gross per 24 hour  Intake 600 ml  Output 950 ml  Net -350 ml    Drains/Lines: Cortrak (gastric) placed 1/23 UOP: 950 ml x24 hours  Average Meal Intake: 1/27: 25-40% x2 recorded meals 1/28: 45% intake x 1 recorded meals  Nutritionally Relevant Medications: Scheduled Meds:  feeding supplement (OSMOLITE 1.5 CAL)  1,000 mL Per Tube Q24H   feeding supplement (PROSource TF20)  60 mL Per Tube Daily   free water   150 mL Per Tube Q6H   insulin  aspart  0-9 Units Subcutaneous TID WC   melatonin  3 mg Oral QHS   metoprolol  tartrate  12.5 mg Per Tube BID   polyethylene glycol  17 g Per Tube BID   Continuous Infusions: PRN Meds: dextrose , sorbitol   Labs Reviewed:  Na+ 134 (L) CBG ranges from 134-217 mg/dL over the last 24 hours HgbA1c 6.1 91/2026)    Diet Order:   Diet Order             DIET - DYS 1 Room service appropriate? No; Fluid consistency: Honey Thick  Diet effective now             EDUCATION NEEDS:  No  education needs have been identified at this time  Skin:  Skin Assessment: Reviewed RN Assessment  Last BM:  1/29 - type6/7 x2  Height:  Ht Readings from Last 1 Encounters:  08/03/24 6' (1.829 m)   Weight:  Wt Readings from Last 1 Encounters:  08/12/24 80.8 kg   Ideal Body Weight:  80.91 kg  BMI:  Body mass index is 24.16 kg/m.  Estimated Nutritional Needs:   Kcal:  1900-2100  Protein:  85-100g  Fluid:  2L  Blair Deaner MS, RD, LDN Registered Dietitian I Clinical Nutrition RD Inpatient Contact Info in Amion

## 2024-08-12 NOTE — Progress Notes (Signed)
 PT Cancellation Note  Patient Details Name: Mark Dickerson MRN: [id number] DOB: Mar 27, 1946   Cancelled Treatment:    Reason Eval/Treat Not Completed: Patient at procedure or test/unavailable (Pt off floor for MBS. Will follow-up for PT treatment as schedule permits.)  Randall SAUNDERS, PT, DPT Acute Rehabilitation Services Office: [phone] Secure Chat Preferred  Mark Dickerson 08/12/2024, 1:57 PM

## 2024-08-13 DIAGNOSIS — I4891 Unspecified atrial fibrillation: Secondary | ICD-10-CM | POA: Diagnosis not present

## 2024-08-13 DIAGNOSIS — I69391 Dysphagia following cerebral infarction: Secondary | ICD-10-CM | POA: Diagnosis not present

## 2024-08-13 DIAGNOSIS — I10 Essential (primary) hypertension: Secondary | ICD-10-CM | POA: Diagnosis not present

## 2024-08-13 DIAGNOSIS — I6200 Nontraumatic subdural hemorrhage, unspecified: Secondary | ICD-10-CM | POA: Diagnosis not present

## 2024-08-13 DIAGNOSIS — J449 Chronic obstructive pulmonary disease, unspecified: Secondary | ICD-10-CM | POA: Diagnosis not present

## 2024-08-13 DIAGNOSIS — E785 Hyperlipidemia, unspecified: Secondary | ICD-10-CM | POA: Diagnosis not present

## 2024-08-13 DIAGNOSIS — E1142 Type 2 diabetes mellitus with diabetic polyneuropathy: Secondary | ICD-10-CM | POA: Diagnosis not present

## 2024-08-13 DIAGNOSIS — K5909 Other constipation: Secondary | ICD-10-CM

## 2024-08-13 DIAGNOSIS — Z720 Tobacco use: Secondary | ICD-10-CM | POA: Diagnosis not present

## 2024-08-13 DIAGNOSIS — Z9181 History of falling: Secondary | ICD-10-CM | POA: Diagnosis not present

## 2024-08-13 DIAGNOSIS — E871 Hypo-osmolality and hyponatremia: Secondary | ICD-10-CM | POA: Diagnosis not present

## 2024-08-13 DIAGNOSIS — R1310 Dysphagia, unspecified: Secondary | ICD-10-CM | POA: Diagnosis not present

## 2024-08-13 LAB — GLUCOSE, CAPILLARY
Glucose-Capillary: 151 mg/dL — ABNORMAL HIGH (ref 70–99)
Glucose-Capillary: 123 mg/dL — ABNORMAL HIGH (ref 70–99)
Glucose-Capillary: 157 mg/dL — ABNORMAL HIGH (ref 70–99)
Glucose-Capillary: 95 mg/dL (ref 70–99)

## 2024-08-13 NOTE — Plan of Care (Signed)
 Was restless, agitated but cooperative throughout the day so helped him re-orient to the surrounding.    Problem: Education: Goal: Knowledge of disease or condition will improve Outcome: Progressing   Problem: Fluid Volume: Goal: Ability to maintain a balanced intake and output will improve Outcome: Progressing   Problem: Coping: Goal: Ability to adjust to condition or change in health will improve Outcome: Progressing   Problem: Skin Integrity: Goal: Risk for impaired skin integrity will decrease Outcome: Progressing   Problem: Safety: Goal: Ability to remain free from injury will improve Outcome: Progressing   Problem: Elimination: Goal: Will not experience complications related to urinary retention Outcome: Progressing   Problem: Coping: Goal: Level of anxiety will decrease Outcome: Progressing   Problem: Nutrition: Goal: Adequate nutrition will be maintained Outcome: Progressing   Problem: Activity: Goal: Risk for activity intolerance will decrease Outcome: Progressing   Problem: Clinical Measurements: Goal: Ability to maintain clinical measurements within normal limits will improve Outcome: Progressing   Problem: Clinical Measurements: Goal: Will remain free from infection Outcome: Progressing   Problem: Health Behavior/Discharge Planning: Goal: Ability to manage health-related needs will improve Outcome: Progressing   Problem: Nutrition: Goal: Dietary intake will improve Outcome: Progressing   Problem: Nutrition: Goal: Risk of aspiration will decrease Outcome: Progressing   Problem: Education: Goal: Knowledge of General Education information will improve Description: Including pain rating scale, medication(s)/side effects and non-pharmacologic comfort measures Outcome: Progressing   Problem: Coping: Goal: Will verbalize positive feelings about self Outcome: Progressing   Problem: Ischemic Stroke/TIA Tissue Perfusion: Goal: Complications of  ischemic stroke/TIA will be minimized Outcome: Progressing

## 2024-08-13 NOTE — Plan of Care (Signed)
" °  Problem: Education: Goal: Knowledge of disease or condition will improve Outcome: Progressing Goal: Knowledge of secondary prevention will improve (MUST DOCUMENT ALL) Outcome: Progressing Goal: Knowledge of patient specific risk factors will improve (DELETE if not current risk factor) Outcome: Progressing   Problem: Ischemic Stroke/TIA Tissue Perfusion: Goal: Complications of ischemic stroke/TIA will be minimized Outcome: Progressing   Problem: Coping: Goal: Will verbalize positive feelings about self Outcome: Progressing Goal: Will identify appropriate support needs Outcome: Progressing   Problem: Health Behavior/Discharge Planning: Goal: Ability to manage health-related needs will improve Outcome: Progressing Goal: Goals will be collaboratively established with patient/family Outcome: Progressing   Problem: Self-Care: Goal: Ability to participate in self-care as condition permits will improve Outcome: Progressing Goal: Verbalization of feelings and concerns over difficulty with self-care will improve Outcome: Progressing Goal: Ability to communicate needs accurately will improve Outcome: Progressing   Problem: Nutrition: Goal: Risk of aspiration will decrease Outcome: Progressing Goal: Dietary intake will improve Outcome: Progressing   Problem: Education: Goal: Knowledge of General Education information will improve Description: Including pain rating scale, medication(s)/side effects and non-pharmacologic comfort measures Outcome: Progressing   Problem: Health Behavior/Discharge Planning: Goal: Ability to manage health-related needs will improve Outcome: Progressing   Problem: Clinical Measurements: Goal: Ability to maintain clinical measurements within normal limits will improve Outcome: Progressing Goal: Will remain free from infection Outcome: Progressing Goal: Diagnostic test results will improve Outcome: Progressing Goal: Respiratory complications will  improve Outcome: Progressing Goal: Cardiovascular complication will be avoided Outcome: Progressing   Problem: Activity: Goal: Risk for activity intolerance will decrease Outcome: Progressing   Problem: Nutrition: Goal: Adequate nutrition will be maintained Outcome: Progressing   Problem: Coping: Goal: Level of anxiety will decrease Outcome: Progressing   Problem: Elimination: Goal: Will not experience complications related to bowel motility Outcome: Progressing Goal: Will not experience complications related to urinary retention Outcome: Progressing   Problem: Pain Managment: Goal: General experience of comfort will improve and/or be controlled Outcome: Progressing   Problem: Safety: Goal: Ability to remain free from injury will improve Outcome: Progressing   Problem: Skin Integrity: Goal: Risk for impaired skin integrity will decrease Outcome: Progressing   Problem: Education: Goal: Ability to describe self-care measures that may prevent or decrease complications (Diabetes Survival Skills Education) will improve Outcome: Progressing Goal: Individualized Educational Video(s) Outcome: Progressing   Problem: Coping: Goal: Ability to adjust to condition or change in health will improve Outcome: Progressing   Problem: Fluid Volume: Goal: Ability to maintain a balanced intake and output will improve Outcome: Progressing   Problem: Health Behavior/Discharge Planning: Goal: Ability to identify and utilize available resources and services will improve Outcome: Progressing Goal: Ability to manage health-related needs will improve Outcome: Progressing   "

## 2024-08-14 DIAGNOSIS — I6200 Nontraumatic subdural hemorrhage, unspecified: Secondary | ICD-10-CM | POA: Diagnosis not present

## 2024-08-14 DIAGNOSIS — R1310 Dysphagia, unspecified: Secondary | ICD-10-CM | POA: Diagnosis not present

## 2024-08-14 DIAGNOSIS — I10 Essential (primary) hypertension: Secondary | ICD-10-CM | POA: Diagnosis not present

## 2024-08-14 DIAGNOSIS — I69391 Dysphagia following cerebral infarction: Secondary | ICD-10-CM | POA: Diagnosis not present

## 2024-08-14 LAB — CBC
HCT: 46.5 % (ref 39.0–52.0)
Hemoglobin: 16 g/dL (ref 13.0–17.0)
MCH: 32 pg (ref 26.0–34.0)
MCHC: 34.4 g/dL (ref 30.0–36.0)
MCV: 93 fL (ref 80.0–100.0)
Platelets: 222 10*3/uL (ref 150–400)
RBC: 5 MIL/uL (ref 4.22–5.81)
RDW: 15.4 % (ref 11.5–15.5)
WBC: 9.8 10*3/uL (ref 4.0–10.5)
nRBC: 0 % (ref 0.0–0.2)

## 2024-08-14 LAB — GLUCOSE, CAPILLARY
Glucose-Capillary: 122 mg/dL — ABNORMAL HIGH (ref 70–99)
Glucose-Capillary: 191 mg/dL — ABNORMAL HIGH (ref 70–99)
Glucose-Capillary: 108 mg/dL — ABNORMAL HIGH (ref 70–99)
Glucose-Capillary: 156 mg/dL — ABNORMAL HIGH (ref 70–99)

## 2024-08-14 LAB — BASIC METABOLIC PANEL WITH GFR
Anion gap: 15 (ref 5–15)
BUN: 29 mg/dL — ABNORMAL HIGH (ref 8–23)
CO2: 19 mmol/L — ABNORMAL LOW (ref 22–32)
Calcium: 9.5 mg/dL (ref 8.9–10.3)
Chloride: 102 mmol/L (ref 98–111)
Creatinine, Ser: 1.15 mg/dL (ref 0.61–1.24)
GFR, Estimated: >60 mL/min
Glucose, Bld: 133 mg/dL — ABNORMAL HIGH (ref 70–99)
Potassium: 4.4 mmol/L (ref 3.5–5.1)
Sodium: 136 mmol/L (ref 135–145)

## 2024-08-14 MED ORDER — SODIUM CHLORIDE 0.9 % IV BOLUS: 1000.0000 mL | Freq: Once | INTRAVENOUS | Status: DC

## 2024-08-14 NOTE — Progress Notes (Signed)
 "  HD#11 SUBJECTIVE:  Patient Summary: Mark Dickerson is a 79 y.o. 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   Overnight Events: NAEOV  Interim History:  Seen on rounds, patient asleep and mildly somnolent. Per calorie count documentation, ate 100% of breakfast and lunch, ~20-30% of dinner. Awaiting full caloric intake assessment from RD.  OBJECTIVE:  Vital Signs: Vitals:   08/13/24 2002 08/14/24 0421 08/14/24 0815 08/14/24 0849  BP: 133/76 136/64 120/73 102/60  Pulse: 78 (!) 112 62 77  Resp: 14 14 18 18   Temp: 98.3 F (36.8 C) 97.7 F (36.5 C) 97.8 F (36.6 C) 98.1 F (36.7 C)  TempSrc: Oral Oral Oral Oral  SpO2: 100% (!) 86% 97% 96%  Weight:      Height:       Supplemental O2: Room Air SpO2: 96 % O2 Flow Rate (L/min): 0 L/min FiO2 (%): 21 %  Filed Weights   08/10/24 0557 08/11/24 0433 08/12/24 0600  Weight: 81.6 kg 83.3 kg 80.8 kg     Intake/Output Summary (Last 24 hours) at 08/14/2024 9083 Last data filed at 08/13/2024 1300 Gross per 24 hour  Intake --  Output 200 ml  Net -200 ml   Net IO Since Admission: 101.66 mL [08/14/24 0916]  Physical Exam: Constitutional: well-appearing, elderly male in no acute distress HENT: normocephalic atraumatic, Dobhoff tube in place via right nare Eyes: conjunctiva non-erythematous, PERRL, no scleral icterus Cardiovascular: irregularly irregular rhythm with regular rate, no m/r/g Pulmonary/Chest: normal work of breathing on room air, anterior lung fields clear to  auscultation bilaterally Abdominal: soft, non-tender, non-distended, hyperactive bowel sounds Neurological: Somnolent, able to tell me his name but not date of birth or place. Mild right facial droop upon smile, otherwise CN II-XII intact.  Dysarthric but not aphasic. Skin: warm and dry, decreased skin turgor to arm + anterior neck Extremities: no edema or cyanosis; peripheral pulses intact; grossly thickened and yellow discoloration of toenails.  Small (0.5 cm in diameter) ulcer on solar aspect of left foot, no bleeding or discharge noted.  Patient Lines/Drains/Airways Status     Active Line/Drains/Airways     Name Placement date Placement time Site Days   Peripheral IV 08/03/24 20 G Anterior;Right Forearm 08/03/24  1025  Forearm  11   External Urinary Catheter 08/07/24  1957  --  7            Pertinent labs and imaging:      Latest Ref Rng & Units 08/14/2024    4:04 AM 08/12/2024    1:55 AM 08/11/2024    1:12 AM  CBC  WBC 4.0 - 10.5 K/uL 9.8  9.4  9.3   Hemoglobin 13.0 - 17.0 g/dL 83.9  85.4  86.3   Hematocrit 39.0 - 52.0 % 46.5  42.6  39.9   Platelets 150 - 400 K/uL 222  186  179        Latest Ref Rng & Units 08/14/2024    4:04 AM 08/12/2024    1:55 AM 08/11/2024    1:12 AM  CMP  Glucose 70 - 99 mg/dL 866  845  216   BUN 8 - 23 mg/dL 29  27  26    Creatinine 0.61 - 1.24 mg/dL 8.84  9.10  9.10   Sodium 135 - 145 mmol/L 136  134  132   Potassium 3.5 - 5.1 mmol/L 4.4  4.3  3.9   Chloride 98 - 111 mmol/L 102  100  97   CO2 22 - 32 mmol/L 19  22  23    Calcium  8.9 - 10.3 mg/dL 9.5  89.8  9.3     ASSESSMENT/PLAN:  Assessment: Principal Problem:   Acute stroke of basal ganglia (HCC) Active Problems:   Hypertension   Hyperlipidemia   Type 2 diabetes mellitus with complication, without long-term current use of insulin  (HCC)   Subdural hematoma (HCC)   Atrial fibrillation (HCC)   Malnutrition of moderate degree   SDH (subdural hematoma) (HCC)   Acute ischemic stroke  (HCC)   Plan: #Acute stroke of left basal ganglia w/ severe dysphagia #Concern for Malnutrition Clinically stable compared to yesterday and overall improved from admission. MRI demonstrated an acute left basal ganglia infarct. A1c optimized; LDL remains above goal, and high-intensity statin initiated. Remains in rate-controlled atrial fibrillation (new onset this admission); no definite embolic source identified on TTE. DAPT held due to prior SDH. Continue daily neurologic exams. PT recommends CIR; however, not a candidate due to lack of 24/7 support. Plan for SNF placement. Appreciate SLP and RD assistance. - He tolerated majority of dysphagia 1 diet yesterday, from calorie count documentation.  Still awaiting RD for total calorie assessment..   - Continue Crestor  20 mg daily - Hold antiplatelet therapy pending repeat CT head (2/5-2/12) - SBP goal < 160 - Appreciate SLP/RD assistance for calorie counting  #Hospital Delirium Intermittent agitation likely due to hospital-acquired delirium without evidence of infection or new deficits, with contributing factors mitigated. - Delirium precautions. - Seroquel  25 mg at bedtime PRN for restlessness/agitation - Hydroxyzine  25 mg tid prn for anxiety - Continue nightly melatonin 3 mg  #Subdural hematoma #Probable fall on antiplatelet therapy #History of Falls  No new falls during hospitalization. Most recently suspected immediately prior to admission. No new symptoms or signs indicative of increased ICP/progression of SDH. No new neuro deficits, exam consistent with prior days. Still plan to repeat [facility] on original timeline recommended by neurology. 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 Patient declined biopsy and oncology involvement during initial evaluation by Dr. Karena with [facility]. He is still full code and would like stroke workup, however he is now declining PEG tube if this were to be needed.  May be reasonable to initiate goals of care discussion with palliative assistance.  This patient likely has poor prognosis for multiple comorbidities including suspected cancer and now known stroke that we are not able to fully optimize with antiplatelet therapy due to history of SDH.  Patient's sister-in-law Lugene Beougher) is HCPOA but patient currently has capacity to make medical decisions. - Robitussin 5 ml q4h PRN for cough/loosen phlegm - Dysphagia plan as above  #New-onset atrial fibrillation without RVR  Patient is in A-fib without RVR. No prior history of A-fib or anticoagulation. CHA2DS2-VASc of 7. Rate control with metoprolol .  Holding AC d/t SDH. Stopping telemetry as rates have been controlled with beta blocker and this is likely contributing to delirium. - Continue metoprolol  tartrate 12.5 mg bid per tube  #Hypertension  BP stable.  Continue medicines as below. - Continue amlodipine  10 mg daily per tube - Continue home benazepril  40 mg daily per tube - Continue metoprolol  tartrate 12.5 mg bid per tube - IV Labetalol  q2h PRN for SBP > 170 or DBP > 110 - Hold home hydrochlorothiazide  #Type 2 diabetes mellitus, non-insulin  dependent, controlled, complicated by peripheral neuropathy and large vessel disease Stable BG. - Hold home metformin 500 mg daily - Sensitive SSI  #Tobacco Use Disorder - Nicotine  patch 14 g daily  #PAD (with multiple LE revascularization procedures) #History of NSTEMI (post-procedure) - Hold home antiplatelet therapy per recommendations above  #HLD - Continue Crestor  20 mg daily  #Constipation - MiraLAX  17 g per tube twice daily - Sorbitol  70% solution 15 mL per tube daily  as needed  #COPD - home albuterol  inhaler  Best Practice: Diet: Dysphagia I, moderately thick liquids (Dobhoff in place) IVF: Fluids: None VTE: enoxaparin  (LOVENOX ) injection 40 mg Start: 08/12/24 1800 Place and maintain sequential compression device Start: 08/03/24 1848 Code: Full  Disposition planning: Therapy Recs: CIR, DME: none Family Contact: brother and sister-in-law, to be notified. DISPO: Anticipated discharge to SNF pending   Signature: Missy Sandhoff, M.D.  Internal Medicine Resident, PGY-2  9:25 AM, 08/14/2024   "

## 2024-08-14 NOTE — Plan of Care (Signed)
 Calm and cooperative today. He often has cough during swallowing his food. Encouraged for self-feeding with minimal assistance.   Problem: Education: Goal: Knowledge of disease or condition will improve Outcome: Progressing   Problem: Ischemic Stroke/TIA Tissue Perfusion: Goal: Complications of ischemic stroke/TIA will be minimized Outcome: Progressing   Problem: Coping: Goal: Will verbalize positive feelings about self Outcome: Progressing   Problem: Health Behavior/Discharge Planning: Goal: Ability to manage health-related needs will improve Outcome: Progressing   Problem: Nutrition: Goal: Risk of aspiration will decrease Outcome: Progressing   Problem: Nutrition: Goal: Dietary intake will improve Outcome: Progressing   Problem: Education: Goal: Knowledge of General Education information will improve Description: Including pain rating scale, medication(s)/side effects and non-pharmacologic comfort measures Outcome: Progressing   Problem: Clinical Measurements: Goal: Will remain free from infection Outcome: Progressing   Problem: Activity: Goal: Risk for activity intolerance will decrease Outcome: Progressing   Problem: Nutrition: Goal: Adequate nutrition will be maintained Outcome: Progressing   Problem: Coping: Goal: Level of anxiety will decrease Outcome: Progressing   Problem: Skin Integrity: Goal: Risk for impaired skin integrity will decrease Outcome: Progressing   Problem: Coping: Goal: Ability to adjust to condition or change in health will improve Outcome: Progressing   Problem: Fluid Volume: Goal: Ability to maintain a balanced intake and output will improve Outcome: Progressing

## 2024-08-15 DIAGNOSIS — E871 Hypo-osmolality and hyponatremia: Secondary | ICD-10-CM | POA: Diagnosis not present

## 2024-08-15 DIAGNOSIS — K5909 Other constipation: Secondary | ICD-10-CM | POA: Diagnosis not present

## 2024-08-15 DIAGNOSIS — J449 Chronic obstructive pulmonary disease, unspecified: Secondary | ICD-10-CM | POA: Diagnosis not present

## 2024-08-15 DIAGNOSIS — I4891 Unspecified atrial fibrillation: Secondary | ICD-10-CM | POA: Diagnosis not present

## 2024-08-15 DIAGNOSIS — I69391 Dysphagia following cerebral infarction: Secondary | ICD-10-CM | POA: Diagnosis not present

## 2024-08-15 DIAGNOSIS — I10 Essential (primary) hypertension: Secondary | ICD-10-CM | POA: Diagnosis not present

## 2024-08-15 DIAGNOSIS — E785 Hyperlipidemia, unspecified: Secondary | ICD-10-CM | POA: Diagnosis not present

## 2024-08-15 DIAGNOSIS — R1310 Dysphagia, unspecified: Secondary | ICD-10-CM | POA: Diagnosis not present

## 2024-08-15 DIAGNOSIS — Z7984 Long term (current) use of oral hypoglycemic drugs: Secondary | ICD-10-CM | POA: Diagnosis not present

## 2024-08-15 DIAGNOSIS — E1151 Type 2 diabetes mellitus with diabetic peripheral angiopathy without gangrene: Secondary | ICD-10-CM | POA: Diagnosis not present

## 2024-08-15 DIAGNOSIS — I6200 Nontraumatic subdural hemorrhage, unspecified: Secondary | ICD-10-CM | POA: Diagnosis not present

## 2024-08-15 DIAGNOSIS — E1142 Type 2 diabetes mellitus with diabetic polyneuropathy: Secondary | ICD-10-CM | POA: Diagnosis not present

## 2024-08-15 LAB — CBC
HCT: 43.1 % (ref 39.0–52.0)
Hemoglobin: 14.7 g/dL (ref 13.0–17.0)
MCH: 32.2 pg (ref 26.0–34.0)
MCHC: 34.1 g/dL (ref 30.0–36.0)
MCV: 94.3 fL (ref 80.0–100.0)
Platelets: 202 10*3/uL (ref 150–400)
RBC: 4.57 MIL/uL (ref 4.22–5.81)
RDW: 15.4 % (ref 11.5–15.5)
WBC: 8.5 10*3/uL (ref 4.0–10.5)
nRBC: 0 % (ref 0.0–0.2)

## 2024-08-15 LAB — BASIC METABOLIC PANEL WITH GFR
Anion gap: 14 (ref 5–15)
BUN: 37 mg/dL — ABNORMAL HIGH (ref 8–23)
CO2: 22 mmol/L (ref 22–32)
Calcium: 9.4 mg/dL (ref 8.9–10.3)
Chloride: 104 mmol/L (ref 98–111)
Creatinine, Ser: 1.13 mg/dL (ref 0.61–1.24)
GFR, Estimated: >60 mL/min
Glucose, Bld: 136 mg/dL — ABNORMAL HIGH (ref 70–99)
Potassium: 4.2 mmol/L (ref 3.5–5.1)
Sodium: 140 mmol/L (ref 135–145)

## 2024-08-15 LAB — GLUCOSE, CAPILLARY
Glucose-Capillary: 151 mg/dL — ABNORMAL HIGH (ref 70–99)
Glucose-Capillary: 146 mg/dL — ABNORMAL HIGH (ref 70–99)
Glucose-Capillary: 94 mg/dL (ref 70–99)
Glucose-Capillary: 122 mg/dL — ABNORMAL HIGH (ref 70–99)

## 2024-08-15 MED ORDER — SODIUM CHLORIDE 0.9 % IV BOLUS
1000.0000 mL | Freq: Once | INTRAVENOUS | Status: AC
  Administered 2024-08-15: 1000 mL via INTRAVENOUS

## 2024-08-15 NOTE — Progress Notes (Signed)
 Physical Therapy Treatment Patient Details Name: Mark Dickerson MRN: [id number] DOB: [birth year] Today's Date: 08/15/2024   History of Present Illness Pt is 79 y/o male presenting on 08/03/24 with slurred speech, R facial droop since 1/19. Found with small volume subdural hematoma along the falx, also with L basal ganglia infarct. Repeat CT stable. PMH includes: DM, HTN, MI, PAD.    PT Comments  Pt with fair tolerance to treatment today. Pt requiring up to Mod A for all mobility today. Noted with R lateral lean requiring mirror for visual feedback to correct. Still very limited by decreased activity tolerance, R inattention, and decreased command following. No change in DC/DME recs at this time. PT will continue to follow.     If plan is discharge home, recommend the following: A lot of help with bathing/dressing/bathroom;Assistance with cooking/housework;Assist for transportation;Help with stairs or ramp for entrance;A lot of help with walking and/or transfers;Supervision due to cognitive status   Can travel by private vehicle     No  Equipment Recommendations  BSC/3in1    Recommendations for Other Services       Precautions / Restrictions Precautions Precautions: Fall Recall of Precautions/Restrictions: Impaired Restrictions Weight Bearing Restrictions Per Provider Order: No     Mobility  Bed Mobility Overal bed mobility: Needs Assistance Bed Mobility: Supine to Sit     Supine to sit: Mod assist, HOB elevated, Used rails     General bed mobility comments: Once seated EOB pt with heavy R lateral lean and multiple LOB to that side. Mirror provided for visual feedback with up to Mod A to correct.    Transfers Overall transfer level: Needs assistance Equipment used: Rolling walker (2 wheels) Transfers: Sit to/from Stand Sit to Stand: Mod assist           General transfer comment: Cues for hand placement. Mod A to power up and steady.     Ambulation/Gait Ambulation/Gait assistance: Mod assist Gait Distance (Feet): 80 Feet Assistive device: Rolling walker (2 wheels) Gait Pattern/deviations: Step-to pattern, Decreased stride length, Decreased dorsiflexion - right, Trunk flexed, Drifts right/left Gait velocity: Decreased Gait velocity interpretation: <1.8 ft/sec, indicate of risk for recurrent falls   General Gait Details: Pt with continued cues for proximity to RW. Pt with very short high cadence steps with therapist holding RW so it would not get too far in front of pt. pt also noted with RLE externally rotated and running into objects on R side.   Stairs             Wheelchair Mobility     Tilt Bed    Modified Rankin (Stroke Patients Only) Modified Rankin (Stroke Patients Only) Pre-Morbid Rankin Score: No symptoms Modified Rankin: Moderately severe disability     Balance Overall balance assessment: Needs assistance Sitting-balance support: Single extremity supported, Bilateral upper extremity supported, Feet supported Sitting balance-Leahy Scale: Poor Sitting balance - Comments: Heavy R lateral lean with multiple LOB toward that side. Mirror provided for visual feedback. Postural control: Right lateral lean Standing balance support: Bilateral upper extremity supported, During functional activity, Reliant on assistive device for balance Standing balance-Leahy Scale: Poor Standing balance comment: Pt dependent on RW, heavily leaning to the R side                            Communication Communication Communication: Impaired Factors Affecting Communication: Reduced clarity of speech (Dysarthric)  Cognition Arousal: Alert Behavior During Therapy: Flat affect, Impulsive  PT - Cognitive impairments: Memory, Safety/Judgement, Sequencing, Problem solving, Awareness, Attention, Initiation                       PT - Cognition Comments: Pt more oriented today compared to previous  session however noted with delay in processing and command following. Followed about 50% of commands. Following commands: Impaired Following commands impaired: Follows one step commands with increased time, Follows one step commands inconsistently    Cueing Cueing Techniques: Verbal cues, Visual cues, Tactile cues  Exercises      General Comments General comments (skin integrity, edema, etc.): VSS      Pertinent Vitals/Pain Pain Assessment Pain Assessment: Faces Faces Pain Scale: No hurt Pain Intervention(s): Monitored during session    Home Living                          Prior Function            PT Goals (current goals can now be found in the care plan section) Progress towards PT goals: Progressing toward goals    Frequency    Min 2X/week      PT Plan      Co-evaluation              AM-PAC PT 6 Clicks Mobility   Outcome Measure  Help needed turning from your back to your side while in a flat bed without using bedrails?: A Lot Help needed moving from lying on your back to sitting on the side of a flat bed without using bedrails?: A Lot Help needed moving to and from a bed to a chair (including a wheelchair)?: A Lot Help needed standing up from a chair using your arms (e.g., wheelchair or bedside chair)?: A Lot Help needed to walk in hospital room?: A Lot Help needed climbing 3-5 steps with a railing? : A Lot 6 Click Score: 12    End of Session Equipment Utilized During Treatment: Gait belt Activity Tolerance: Patient tolerated treatment well;Patient limited by fatigue Patient left: in chair;with call bell/phone within reach;with chair alarm set Nurse Communication: Mobility status PT Visit Diagnosis: Other abnormalities of gait and mobility (R26.89)     Time: 1002-1019 PT Time Calculation (min) (ACUTE ONLY): 17 min  Charges:    $Gait Training: 8-22 mins PT General Charges $$ ACUTE PT VISIT: 1 Visit                     Sueellen NOVAK,  PT, DPT Acute Rehab Services [phone]    Tylicia Sherman 08/15/2024, 12:04 PM

## 2024-08-15 NOTE — Progress Notes (Signed)
 Speech Language Pathology Treatment: Dysphagia  Patient Details Name: Mark Dickerson MRN: [id number] DOB: 04-Sep-1945 Today's Date: 08/15/2024 Time: 8852-8794 SLP Time Calculation (min) (ACUTE ONLY): 18 min  Assessment / Plan / Recommendation Clinical Impression  Pt's mentation greatly improved since previous SLP encounter. Pt consumed puree, soft, and regular solids. Pt displayed prolonged bolus formation/oral holding across consistencies trialed, however suspect this is related to cognition. Pt had increased oral residue with regular solid, however this appeared to resolve w/ cues for liquid wash. Pt also displayed prolonged mastication of regular solids. SLP offered Dys 2 texture; pt showed more efficient mastication and improved level of oral residue w/o liquid wash. Pt required cueing to monitor oral residue and follow solids with liquid wash. SLP educated pt on swallow strategies and upgraded diet recommendation. Pt appeared to understanding, but likely needs reinforcement d/t cognitive concerns.  PLAN: Recommend advancing solids to Dys 2 and continuing honey-thick liquids, which will offer some more nutritional options for pt. Pt at increased risk of aspiration while delirious, please ensure pt is awake and alert before providing POs.    HPI HPI: Pt is a 79 y/o male presenting on 08/03/24 with slurred speech, R facial droop, and difficulty swallowing since 1/19. Found with small volume subdural hematoma along the falx, also with L basal ganglia infarct. Repeat CT stable. MBS 1/22 showed sensed aspiration of thin and nectar-thick liquids. Pt was unable to clear penetrate due to weak cough. Puree and honey-thick diet ordered. Pt w/ s/s of aspiration at bedside on 1/23, diet changed to NPO with Cortrak. Puree and honey-thick diet resumed 1/26. Cortrak removed 1/30.     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      SLP Plan  Continue with  current plan of care        Swallow Evaluation Recommendations   Recommendations: PO diet PO Diet Recommendation: Dysphagia 2 (Finely chopped);Moderately thick liquids (Level 3, honey thick) Liquid Administration via: Cup Medication Administration: Crushed with puree Supervision: Full supervision/cueing for swallowing strategies Postural changes: Position pt fully upright for meals;Stay upright 30-60 min after meals Oral care recommendations: Oral care BID (2x/day) Caregiver Recommendations: Avoid jello, ice cream, thin soups, popsicles;Remove water  pitcher     Recommendations                     Oral care BID   Frequent or constant Supervision/Assistance Dysphagia, oropharyngeal phase (R13.12)     Continue with current plan of care     Rocky Quan, Student SLP  08/15/2024, 12:50 PM

## 2024-08-15 NOTE — TOC Progression Note (Signed)
 Transition of Care [facility]) - Progression Note    Patient Details  Name: Gauge Winski MRN: [id number] Date of Birth: 06-03-1946  [facility] CM/SW Contact  [name], [name] Phone Number: 08/15/2024, 10:03 AM  Clinical Narrative:   CSW spoke with SIL Erminio, who has POA over patient. CSW asked Erminio to provide documentation for patient's chart, she will bring whenever she is able to get out of her neighborhood, hopefully Wednesday. CSW discussed SNF options, Erminio would like [facility] when patient is stable. Medical workup ongoing, following for patient's nutrition. Erminio reported she had been thinking about possible need for PEG over the weekend and had questions for the doctor, would like them to call. CSW relayed message to MD. CSW to follow.    Expected Discharge Plan: Skilled Nursing Facility Barriers to Discharge: Continued Medical Work up, English As A Second Language Teacher               Expected Discharge Plan and Services   Discharge Planning Services: CM Consult Post Acute Care Choice: IP Rehab Living arrangements for the past 2 months: Single Family Home                                       Social Drivers of Health (SDOH) Interventions SDOH Screenings   Food Insecurity: Patient Declined (08/04/2024)  Housing: Unknown (08/04/2024)  Transportation Needs: Patient Declined (08/04/2024)  Utilities: Patient Declined (08/04/2024)  Social Connections: Patient Declined (08/04/2024)  Tobacco Use: High Risk (09/17/2023)   Received from [facility]    Readmission Risk Interventions     No data to display

## 2024-08-16 DIAGNOSIS — I10 Essential (primary) hypertension: Secondary | ICD-10-CM | POA: Diagnosis not present

## 2024-08-16 DIAGNOSIS — J449 Chronic obstructive pulmonary disease, unspecified: Secondary | ICD-10-CM | POA: Diagnosis not present

## 2024-08-16 DIAGNOSIS — E785 Hyperlipidemia, unspecified: Secondary | ICD-10-CM | POA: Diagnosis not present

## 2024-08-16 DIAGNOSIS — K5900 Constipation, unspecified: Secondary | ICD-10-CM | POA: Diagnosis not present

## 2024-08-16 DIAGNOSIS — E1142 Type 2 diabetes mellitus with diabetic polyneuropathy: Secondary | ICD-10-CM | POA: Diagnosis not present

## 2024-08-16 DIAGNOSIS — I6200 Nontraumatic subdural hemorrhage, unspecified: Secondary | ICD-10-CM | POA: Diagnosis not present

## 2024-08-16 DIAGNOSIS — I4891 Unspecified atrial fibrillation: Secondary | ICD-10-CM | POA: Diagnosis not present

## 2024-08-16 DIAGNOSIS — R1310 Dysphagia, unspecified: Secondary | ICD-10-CM | POA: Diagnosis not present

## 2024-08-16 DIAGNOSIS — Z7984 Long term (current) use of oral hypoglycemic drugs: Secondary | ICD-10-CM | POA: Diagnosis not present

## 2024-08-16 DIAGNOSIS — E1151 Type 2 diabetes mellitus with diabetic peripheral angiopathy without gangrene: Secondary | ICD-10-CM | POA: Diagnosis not present

## 2024-08-16 DIAGNOSIS — I69391 Dysphagia following cerebral infarction: Secondary | ICD-10-CM | POA: Diagnosis not present

## 2024-08-16 LAB — GLUCOSE, CAPILLARY
Glucose-Capillary: 143 mg/dL — ABNORMAL HIGH (ref 70–99)
Glucose-Capillary: 188 mg/dL — ABNORMAL HIGH (ref 70–99)
Glucose-Capillary: 127 mg/dL — ABNORMAL HIGH (ref 70–99)
Glucose-Capillary: 122 mg/dL — ABNORMAL HIGH (ref 70–99)

## 2024-08-16 LAB — BASIC METABOLIC PANEL WITH GFR
Anion gap: 14 (ref 5–15)
BUN: 33 mg/dL — ABNORMAL HIGH (ref 8–23)
CO2: 18 mmol/L — ABNORMAL LOW (ref 22–32)
Calcium: 9.4 mg/dL (ref 8.9–10.3)
Chloride: 107 mmol/L (ref 98–111)
Creatinine, Ser: 0.91 mg/dL (ref 0.61–1.24)
GFR, Estimated: >60 mL/min
Glucose, Bld: 116 mg/dL — ABNORMAL HIGH (ref 70–99)
Potassium: 4.2 mmol/L (ref 3.5–5.1)
Sodium: 138 mmol/L (ref 135–145)

## 2024-08-16 LAB — CBC
HCT: 44.5 % (ref 39.0–52.0)
Hemoglobin: 14.9 g/dL (ref 13.0–17.0)
MCH: 31.8 pg (ref 26.0–34.0)
MCHC: 33.5 g/dL (ref 30.0–36.0)
MCV: 94.9 fL (ref 80.0–100.0)
Platelets: 197 10*3/uL (ref 150–400)
RBC: 4.69 MIL/uL (ref 4.22–5.81)
RDW: 15.2 % (ref 11.5–15.5)
WBC: 10.1 10*3/uL (ref 4.0–10.5)
nRBC: 0 % (ref 0.0–0.2)

## 2024-08-16 NOTE — Progress Notes (Signed)
 Speech Language Pathology Treatment: Dysphagia;Cognitive-Linguistic  Patient Details Name: Mark Dickerson MRN: [id number] DOB: Nov 24, 1945 Today's Date: 08/16/2024 Time: 8477-8461 SLP Time Calculation (min) (ACUTE ONLY): 16 min  Assessment / Plan / Recommendation Clinical Impression  Pt was seen after diet advancement on previous date, although reports that he does not see any difference in the foods on his more solid diet, compared to his previous diet of purees. Cognitively, pt requires cueing for overall awareness of acute changes. In conversation about discharge he cannot identify any acute deficits for which he needs rehab. With further cueing, he can identify that he has been needing more assistance for physical activity.   From a swallowing standpoint, pt continues to have R buccal pocketing, and when followed by a honey thick liquid wash there is immediate coughing that follows. On other trials he does not have overt concerns for aspiration, but he needs consistent cueing to manage oral residue. He took very limited trials with SLP, but in conversation with NT, she noticed a lot of coughing on initial advanced meal on previous date. RN also voiced concern about ability to manage more solid diet.   PLAN: Will change back to Dys 1 (puree) diet and honey thick liquids by cup. Will continue to follow for swallowing, cognition, and speech with ongoing f/u needed at next level of care.   HPI HPI: Pt is a 79 y/o male presenting on 08/03/24 with slurred speech, R facial droop, and difficulty swallowing since 1/19. Found with small volume subdural hematoma along the falx, also with L basal ganglia infarct. Repeat CT stable. MBS 1/22 showed sensed aspiration of thin and nectar-thick liquids. Pt was unable to clear penetrate due to weak cough. Puree and honey-thick diet ordered. Pt w/ s/s of aspiration at bedside on 1/23, diet changed to NPO with Cortrak. Puree and honey-thick diet resumed 1/26. Cortrak  removed 1/30.     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      SLP Plan  Continue with current plan of care        Swallow Evaluation Recommendations   Recommendations: PO diet PO Diet Recommendation: Moderately thick liquids (Level 3, honey thick);Dysphagia 1 (Pureed) Liquid Administration via: Cup;Spoon;No straw Medication Administration: Crushed with puree Postural changes: Position pt fully upright for meals Oral care recommendations: Oral care BID (2x/day)     Recommendations                         Frequent or constant Supervision/Assistance Dysphagia, oropharyngeal phase (R13.12)     Continue with current plan of care     Leita SAILOR., M.A. CCC-SLP Acute Rehabilitation Services Office: [phone]  Secure chat preferred   08/16/2024, 4:08 PM

## 2024-08-16 NOTE — Plan of Care (Signed)
 " Problem: Education: Goal: Knowledge of disease or condition will improve 08/16/2024 0658 by Abran Houston, RN Outcome: Progressing 08/16/2024 0505 by Abran Houston, RN Outcome: Progressing Goal: Knowledge of secondary prevention will improve (MUST DOCUMENT ALL) 08/16/2024 0658 by Abran Houston, RN Outcome: Progressing 08/16/2024 0505 by Abran Houston, RN Outcome: Progressing Goal: Knowledge of patient specific risk factors will improve (DELETE if not current risk factor) 08/16/2024 0658 by Abran Houston, RN Outcome: Progressing 08/16/2024 0505 by Abran Houston, RN Outcome: Progressing   Problem: Ischemic Stroke/TIA Tissue Perfusion: Goal: Complications of ischemic stroke/TIA will be minimized 08/16/2024 0658 by Abran Houston, RN Outcome: Progressing 08/16/2024 0505 by Abran Houston, RN Outcome: Progressing   Problem: Coping: Goal: Will verbalize positive feelings about self 08/16/2024 0658 by Abran Houston, RN Outcome: Progressing 08/16/2024 0505 by Abran Houston, RN Outcome: Progressing Goal: Will identify appropriate support needs 08/16/2024 0658 by Abran Houston, RN Outcome: Progressing 08/16/2024 0505 by Abran Houston, RN Outcome: Progressing   Problem: Health Behavior/Discharge Planning: Goal: Ability to manage health-related needs will improve 08/16/2024 0658 by Abran Houston, RN Outcome: Progressing 08/16/2024 0505 by Abran Houston, RN Outcome: Progressing Goal: Goals will be collaboratively established with patient/family 08/16/2024 [id number] by Abran Houston, RN Outcome: Progressing 08/16/2024 0505 by Abran Houston, RN Outcome: Progressing   Problem: Self-Care: Goal: Ability to participate in self-care as condition permits will improve 08/16/2024 0658 by Abran Houston, RN Outcome: Progressing 08/16/2024 0505 by Abran Houston, RN Outcome: Progressing Goal: Verbalization of feelings and concerns over difficulty with self-care will improve 08/16/2024 0658 by  Abran Houston, RN Outcome: Progressing 08/16/2024 0505 by Abran Houston, RN Outcome: Progressing Goal: Ability to communicate needs accurately will improve 08/16/2024 0658 by Abran Houston, RN Outcome: Progressing 08/16/2024 0505 by Abran Houston, RN Outcome: Progressing   Problem: Nutrition: Goal: Risk of aspiration will decrease 08/16/2024 0658 by Abran Houston, RN Outcome: Progressing 08/16/2024 0505 by Abran Houston, RN Outcome: Progressing Goal: Dietary intake will improve 08/16/2024 0658 by Abran Houston, RN Outcome: Progressing 08/16/2024 0505 by Abran Houston, RN Outcome: Progressing   Problem: Education: Goal: Knowledge of General Education information will improve Description: Including pain rating scale, medication(s)/side effects and non-pharmacologic comfort measures 08/16/2024 0658 by Abran Houston, RN Outcome: Progressing 08/16/2024 0505 by Abran Houston, RN Outcome: Progressing   Problem: Health Behavior/Discharge Planning: Goal: Ability to manage health-related needs will improve 08/16/2024 0658 by Abran Houston, RN Outcome: Progressing 08/16/2024 0505 by Abran Houston, RN Outcome: Progressing   Problem: Clinical Measurements: Goal: Ability to maintain clinical measurements within normal limits will improve 08/16/2024 0658 by Abran Houston, RN Outcome: Progressing 08/16/2024 0505 by Abran Houston, RN Outcome: Progressing Goal: Will remain free from infection 08/16/2024 0658 by Abran Houston, RN Outcome: Progressing 08/16/2024 0505 by Abran Houston, RN Outcome: Progressing Goal: Diagnostic test results will improve 08/16/2024 0658 by Abran Houston, RN Outcome: Progressing 08/16/2024 0505 by Abran Houston, RN Outcome: Progressing Goal: Respiratory complications will improve 08/16/2024 0658 by Abran Houston, RN Outcome: Progressing 08/16/2024 0505 by Abran Houston, RN Outcome: Progressing Goal: Cardiovascular complication will be  avoided 08/16/2024 0658 by Abran Houston, RN Outcome: Progressing 08/16/2024 0505 by Abran Houston, RN Outcome: Progressing   Problem: Activity: Goal: Risk for activity intolerance will decrease 08/16/2024 0658 by Abran Houston, RN Outcome: Progressing 08/16/2024 0505 by Abran Houston, RN Outcome: Progressing   Problem: Nutrition: Goal: Adequate nutrition will be maintained 08/16/2024 0658 by Abran Houston, RN Outcome: Progressing 08/16/2024 0505 by Abran Houston, RN Outcome: Progressing   Problem: Coping: Goal: Level of anxiety will  decrease 08/16/2024 0658 by Abran Houston, RN Outcome: Progressing 08/16/2024 0505 by Abran Houston, RN Outcome: Progressing   Problem: Elimination: Goal: Will not experience complications related to bowel motility 08/16/2024 0658 by Abran Houston, RN Outcome: Progressing 08/16/2024 0505 by Abran Houston, RN Outcome: Progressing Goal: Will not experience complications related to urinary retention 08/16/2024 0658 by Abran Houston, RN Outcome: Progressing 08/16/2024 0505 by Abran Houston, RN Outcome: Progressing   Problem: Pain Managment: Goal: General experience of comfort will improve and/or be controlled 08/16/2024 0658 by Abran Houston, RN Outcome: Progressing 08/16/2024 0505 by Abran Houston, RN Outcome: Progressing   Problem: Safety: Goal: Ability to remain free from injury will improve 08/16/2024 0658 by Abran Houston, RN Outcome: Progressing 08/16/2024 0505 by Abran Houston, RN Outcome: Progressing   Problem: Skin Integrity: Goal: Risk for impaired skin integrity will decrease 08/16/2024 0658 by Abran Houston, RN Outcome: Progressing 08/16/2024 0505 by Abran Houston, RN Outcome: Progressing   Problem: Education: Goal: Ability to describe self-care measures that may prevent or decrease complications (Diabetes Survival Skills Education) will improve 08/16/2024 0658 by Abran Houston, RN Outcome: Progressing 08/16/2024 0505  by Abran Houston, RN Outcome: Progressing Goal: Individualized Educational Video(s) 08/16/2024 0658 by Abran Houston, RN Outcome: Progressing 08/16/2024 0505 by Abran Houston, RN Outcome: Progressing   Problem: Coping: Goal: Ability to adjust to condition or change in health will improve 08/16/2024 0658 by Abran Houston, RN Outcome: Progressing 08/16/2024 0505 by Abran Houston, RN Outcome: Progressing   Problem: Fluid Volume: Goal: Ability to maintain a balanced intake and output will improve 08/16/2024 0658 by Abran Houston, RN Outcome: Progressing 08/16/2024 0505 by Abran Houston, RN Outcome: Progressing   Problem: Health Behavior/Discharge Planning: Goal: Ability to identify and utilize available resources and services will improve 08/16/2024 0658 by Abran Houston, RN Outcome: Progressing 08/16/2024 0505 by Abran Houston, RN Outcome: Progressing Goal: Ability to manage health-related needs will improve 08/16/2024 0658 by Abran Houston, RN Outcome: Progressing 08/16/2024 0505 by Abran Houston, RN Outcome: Progressing   Problem: Metabolic: Goal: Ability to maintain appropriate glucose levels will improve 08/16/2024 0658 by Abran Houston, RN Outcome: Progressing 08/16/2024 0505 by Abran Houston, RN Outcome: Progressing   Problem: Nutritional: Goal: Maintenance of adequate nutrition will improve 08/16/2024 0658 by Abran Houston, RN Outcome: Progressing 08/16/2024 0505 by Abran Houston, RN Outcome: Progressing Goal: Progress toward achieving an optimal weight will improve 08/16/2024 0658 by Abran Houston, RN Outcome: Progressing 08/16/2024 0505 by Abran Houston, RN Outcome: Progressing   Problem: Skin Integrity: Goal: Risk for impaired skin integrity will decrease 08/16/2024 0658 by Abran Houston, RN Outcome: Progressing 08/16/2024 0505 by Abran Houston, RN Outcome: Progressing   Problem: Tissue Perfusion: Goal: Adequacy of tissue perfusion will  improve 08/16/2024 0658 by Abran Houston, RN Outcome: Progressing 08/16/2024 0505 by Abran Houston, RN Outcome: Progressing   "

## 2024-08-16 NOTE — Plan of Care (Signed)

## 2024-08-17 LAB — BASIC METABOLIC PANEL WITH GFR
Anion gap: 11 (ref 5–15)
BUN: 29 mg/dL — ABNORMAL HIGH (ref 8–23)
CO2: 22 mmol/L (ref 22–32)
Calcium: 9.8 mg/dL (ref 8.9–10.3)
Chloride: 107 mmol/L (ref 98–111)
Creatinine, Ser: 0.84 mg/dL (ref 0.61–1.24)
GFR, Estimated: >60 mL/min
Glucose, Bld: 134 mg/dL — ABNORMAL HIGH (ref 70–99)
Potassium: 4.2 mmol/L (ref 3.5–5.1)
Sodium: 140 mmol/L (ref 135–145)

## 2024-08-17 LAB — GLUCOSE, CAPILLARY
Glucose-Capillary: 136 mg/dL — ABNORMAL HIGH (ref 70–99)
Glucose-Capillary: 140 mg/dL — ABNORMAL HIGH (ref 70–99)
Glucose-Capillary: 172 mg/dL — ABNORMAL HIGH (ref 70–99)

## 2024-08-17 LAB — CBC
HCT: 44.7 % (ref 39.0–52.0)
Hemoglobin: 14.9 g/dL (ref 13.0–17.0)
MCH: 31.6 pg (ref 26.0–34.0)
MCHC: 33.3 g/dL (ref 30.0–36.0)
MCV: 94.9 fL (ref 80.0–100.0)
Platelets: 198 10*3/uL (ref 150–400)
RBC: 4.71 MIL/uL (ref 4.22–5.81)
RDW: 14.9 % (ref 11.5–15.5)
WBC: 9.8 10*3/uL (ref 4.0–10.5)
nRBC: 0 % (ref 0.0–0.2)

## 2024-08-17 MED ORDER — GUAIFENESIN ER 600 MG PO TB12: 600.0000 mg | ORAL_TABLET | Freq: Two times a day (BID) | ORAL | Status: AC | PRN

## 2024-08-17 MED ORDER — AMLODIPINE BESYLATE 10 MG PO TABS: 10.0000 mg | ORAL_TABLET | Freq: Every day | ORAL | Status: AC

## 2024-08-17 MED ORDER — POLYETHYLENE GLYCOL 3350 17 G PO PACK: 17.0000 g | PACK | Freq: Two times a day (BID) | ORAL | Status: AC

## 2024-08-17 MED ORDER — QUETIAPINE FUMARATE 25 MG PO TABS: 25.0000 mg | ORAL_TABLET | Freq: Every evening | ORAL | Status: AC | PRN

## 2024-08-17 MED ORDER — ROSUVASTATIN CALCIUM 20 MG PO TABS: 20.0000 mg | ORAL_TABLET | Freq: Every day | ORAL | Status: AC

## 2024-08-17 MED ORDER — METOPROLOL TARTRATE 25 MG PO TABS: 12.5000 mg | ORAL_TABLET | Freq: Two times a day (BID) | ORAL | Status: AC

## 2024-08-17 MED ORDER — MELATONIN 3 MG PO TABS: 3.0000 mg | ORAL_TABLET | Freq: Every day | ORAL | Status: AC

## 2024-08-17 NOTE — Progress Notes (Signed)
 Discharged to [facility].  Transported by [facility].

## 2024-08-17 NOTE — TOC Transition Note (Signed)
 Transition of Care [facility]) - Discharge Note   Patient Details  Name: Mark Dickerson MRN: [id number] Date of Birth: 1945/10/28  Transition of [facility] CM/SW Contact:  [name], LCSW Phone Number: 08/17/2024, 11:02 AM   Clinical Narrative:   Patient received insurance approval to admit to [facility], and [facility] has a bed available for patient today. CSW spoke with SIL Mark Dickerson, she is in agreement. Mark Dickerson provided [employer] documentation, CSW obtained copy and placed in patient's chart. Transport arranged with [facility] for next available.  Nurse to call report to [phone] Room 502A.    Final next level of care: Skilled Nursing Facility Barriers to Discharge: Barriers Resolved   Patient Goals and CMS Choice   CMS Medicare.gov Compare Post Acute Care list provided to:: Patient Choice offered to / list presented to : Patient      Discharge Placement              Patient chooses bed at: [facility] [place] Patient to be transferred to facility by: [facility] Name of family member notified: Mark Dickerson Patient and family notified of of transfer: 08/17/24  Discharge Plan and Services Additional resources added to the After Visit Summary for     Discharge Planning Services: CM Consult Post Acute Care Choice: [facility] Rehab                               Social Drivers of Health (SDOH) Interventions SDOH Screenings   Food Insecurity: Patient Declined (08/04/2024)  Housing: Unknown (08/04/2024)  Transportation Needs: Patient Declined (08/04/2024)  Utilities: Patient Declined (08/04/2024)  Social Connections: Patient Declined (08/04/2024)  Tobacco Use: High Risk (09/17/2023)   Received from [facility]     Readmission Risk Interventions     No data to display

## 2024-08-17 NOTE — Plan of Care (Signed)

## 2024-08-17 NOTE — Discharge Instructions (Addendum)
 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 Your condition was complicated by difficulty swallowing (dysphagia) which requires a special diet. You briefly had a feeding tube down your nose called a Dobhoff tube to supplement your nutrition while your swallowing function returned. Unfortunately we cannot give you a timeline on when to expect recovery back to your health before your stroke but deficits after a stroke take weeks to months to recover.  We have made some adjustments to your home medications to control your blood pressure better. Something else to think about will be your goals for care moving forward. You should discuss with your family and primary care provider regarding your CODE status and what you would like your medical care to look like should you require future hospitalizations.  Continue taking your home medications with the following changes:  Start taking Metoprolol  to tartrate (Lopressor ) 12.5 mg twice daily Amlodipine  10 mg daily Crestor  20 mg daily Quetiapine  (Seroquel ) 25 mg nightly as needed Melatonin 3 mg nightly Mucinex  600 mg twice daily as needed for cough MiraLAX /GlycoLax  17 g twice daily Stop taking Metoprolol  succinate (Toprol -XL) 50 mg daily Plavix  (clopidogrel ) 75 mg daily Hydrochlorothiazide (HydroDIURIL) 25 mg daily Pravastatin (Pravachol) 20 mg daily   You should seek further medical care if you experience one-sided weakness or numbness in your arms or legs, worsening facial droop, severe confusion, severe dizziness, loss of vision, or an extremely severe headache.  We recommend that you also see your primary care doctor in about a week to make sure that you continue to improve. Please see your follow up information below. We are so glad that you are feeling better.  Sincerely,  [facility]    Contact information for  follow-up providers     [facility]. Schedule an appointment as soon as possible for a visit in 1 month(s).   Specialty: Neurology Why: stroke clinic Contact information: [address] [place]  [address] [phone]             Contact information for after-discharge care     Destination     [facility], [place] .   Service: Skilled Nursing Contact information: [address] [place]  [phone]

## 2024-08-17 NOTE — Discharge Summary (Signed)
 "  Name: Mark Dickerson MRN: [id number] DOB: [birth year] 79 y.o. PCP: Keren Vicenta BRAVO, MD  Date of Admission: 08/03/2024 10:07 AM Date of Discharge: 08/17/2024  Attending Physician: Dr. MICAEL Riis Winfrey  Discharge Diagnosis: Principal Problem:   Acute stroke of basal ganglia (HCC) Active Problems:   Hypertension   Hyperlipidemia   Type 2 diabetes mellitus with complication, without long-term current use of insulin  (HCC)   Subdural hematoma (HCC)   Atrial fibrillation (HCC)   Malnutrition of moderate degree   SDH (subdural hematoma) (HCC)   Acute ischemic stroke [facility])   Discharge Medications: Allergies as of 08/17/2024   No Known Allergies      Medication List     PAUSE taking these medications    metoprolol  succinate 50 MG 24 hr tablet Wait to take this until your doctor or other care provider tells you to start again. Commonly known as: TOPROL -XL Take 50 mg by mouth daily. Take with or immediately following a meal.       STOP taking these medications    clopidogrel  75 MG tablet Commonly known as: PLAVIX    hydrochlorothiazide 25 MG tablet Commonly known as: HYDRODIURIL   pravastatin 20 MG tablet Commonly known as: PRAVACHOL       TAKE these medications    acetaminophen  500 MG tablet Commonly known as: TYLENOL  Take 1,000 mg by mouth every 8 (eight) hours as needed for mild pain (pain score 1-3), moderate pain (pain score 4-6), headache or fever.   albuterol  108 (90 Base) MCG/ACT inhaler Commonly known as: VENTOLIN  HFA Inhale 2 puffs into the lungs every 4 (four) hours as needed for wheezing or shortness of breath.   amLODipine  10 MG tablet Commonly known as: NORVASC  Take 1 tablet (10 mg total) by mouth daily. What changed:  medication strength how much to take   benazepril  40 MG tablet Commonly known as: LOTENSIN  Take 40 mg by mouth daily.   Coenzyme Q10 200 MG capsule Take 200 mg by mouth daily.   fenofibrate 145 MG tablet Commonly  known as: TRICOR Take 145 mg by mouth daily.   gabapentin 300 MG capsule Commonly known as: NEURONTIN Take 300-600 mg by mouth 2 (two) times daily. 300 in am. 600 in pm.   guaiFENesin  600 MG 12 hr tablet Commonly known as: MUCINEX  Take 1 tablet (600 mg total) by mouth 2 (two) times daily as needed.   melatonin 3 MG Tabs tablet Take 1 tablet (3 mg total) by mouth at bedtime.   metFORMIN 500 MG 24 hr tablet Commonly known as: GLUCOPHAGE-XR Take 500 mg by mouth daily.   metoprolol  tartrate 25 MG tablet Commonly known as: LOPRESSOR  Take 0.5 tablets (12.5 mg total) by mouth 2 (two) times daily.   nitroGLYCERIN  0.4 MG SL tablet Commonly known as: NITROSTAT  NEW PRESCRIPTION REQUEST: PLACE ONE TABLET UNDER THE TONGUE BY MOUTH EVERY DAY AS NEEDED What changed: See the new instructions.   polyethylene glycol 17 g packet Commonly known as: MIRALAX  / GLYCOLAX  Take 17 g by mouth 2 (two) times daily.   QUEtiapine  25 MG tablet Commonly known as: SEROQUEL  Take 1 tablet (25 mg total) by mouth at bedtime as needed (For restlessness, agitation).   rosuvastatin  20 MG tablet Commonly known as: CRESTOR  Take 1 tablet (20 mg total) by mouth daily.   Vitamin D (Ergocalciferol) 1.25 MG (50000 UNIT) Caps capsule Commonly known as: DRISDOL Take 50,000 Units by mouth once a week.        Disposition and  follow-up:   Mark Dickerson was discharged from [facility] in Stable condition.  At the hospital follow up visit please address:  Left basal ganglia CVA/Subdural hematoma - not currently on antiplatelet therapy; needs repeat CT head non-con to follow bleed around 08/18/24-08/25/24. If resorbed or nearly resorbed, neurology recommended ASA 81 mg for his stroke. Thought to be embolic d/t new afib on arrival, but TTE negative for source. Severe dysphagia - Please have SLP evaluate if advancing diet is safer, discharged with dysphagia 1 diet. New-onset Afib - switched home  Toprol  to Lopressor  upon discharge for rate control. If SDH resorbed, will need to decide to anticoagulate. Hypertension - Difficult to control while inpatient, though did not start home hydrochlorothiazide d/t concern for hyponatremia. Amlodipine  increased to 10 mg. Please assess whether other changes are necessary to maintain normotension. GOC/Lung Mass - Please clarify further goals of care with known lung mass and suspected lung cancer.  Labs / imaging needed at time of follow-up: CT head non-contrast (to follow SDH) from 2/5-2/12  Pending labs/ test needing follow-up: None   Follow-up Appointments:  Contact information for follow-up providers     [facility]. Schedule an appointment as soon as possible for a visit in 1 month(s).   Specialty: Neurology Why: stroke clinic Contact information: [address] [place]  [address] [phone]             Contact information for after-discharge care     Destination     [facility], [place] .   Service: Skilled Nursing Contact information: [address] [place]  [phone]                     Hospital Course by problem list: #Acute stroke of left basal ganglia with severe dysphagia Patient presented with a left basal ganglia lesion manifesting with a 48 hour history of worsening dysarthria, dysphagia, and right facial droop concerning for a stroke. Patient has an extensive and ongoing history of risk factors including smoking history, HTN, HLD, T2DM, and PAD. Imaging showed 90% narrowing of the distal RCA on CT angiography and small acute infarct in the left basal ganglia on MRI. Etiology of stroke is most likely thrombotic, but at risk for embolic source due to new onset A-fib. Neurology consulted and further workup pursued including TTE (no cardiac source of embolus identified), lipid panel (LDL of 109, switched to  rosuvastatin  20 mg), A1C (6.1), and SBP goal of <160 (in the setting of SDH). PT/OT/SLP consulted, patient was briefly placed on dysphagia 1 diet with moderately thick liquids but transitioned to NPO with Dobhoff after concerns during meals from nursing. After a few days, he was transitioned back to dysphagia 1 diet and meeting close to his nutritional goals (75-80% caloric and protein goals). Dobhoff removed prior to discharge to SNF. Neurology recommends follow up in the stroke clinic in ~4 weeks.  #Subdural hematoma #Probable fall on antiplatelet therapy #History of Falls Patient has had a concerning history of falls over the last 9 months, voiced by the patient's family. They express concern regarding his weight loss and weakness secondary to his suspected lung cancer (see below). They deny any indications that the patient's falls are mechanical in nature. Most recent fall suspected to be prior to presentation at [facility] while on antiplatelet therapy. Imaging showed acute subdural hematoma along the frontal falx cerebri measuring 5 cm on noncontrast CT with stable findings  on repeat CT 6 hours later. Neurosurgery and neurology consulted. Neurology recommended holding antiplatelet therapy until further imaging of SDH in 2-3 weeks (2/5-2/12). If resolved or mostly resorbed, start ASA 81 mg daily. Otherwise, await further recommendations from neurology at stroke clinic.  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 Patient declined biopsy and treatment with initial workup ([facility], Dr. Karena), and denied experiencing any pain or difficulty breathing due to COPD or mass. He was not in any respiratory distress and was oxygenating well on room air throughout his hospitalization. Sister in law, Erminio, is HCPOA.    #New-onset atrial fibrillation without RVR  Patient presented with new-onset A-fib without RVR. No prior history of RVR or anticoagulation. CHA2DS2-VASc of 7. Patient remained asymptomatic throughout his hospitalization, denying any chest pain, palpitations, or shortness of breath. His rates remained well controlled on metoprolol  tartrate. Per 2022 cardiology notes, this was started for coronary artery disease. Did not start DOAC due to c/f for subdural hematoma.   #Hypertension  Patient has a history of poorly controlled hypertension presumably managed with amlodipine , metoprolol , benazepril , and hydrochlorothiazide. During hospitalization, neurology recommended SBP <160 with long-term goal of normotension. Patient's systolic pressures became elevated to 200s, above recommended goal. Patient's home amlodipine  was increased to 10 mg and he received 12.5 mg lopressor  bid, and benazepril  40 mg. Did require IV labetalol  occasionally. Held home hydrochlorothiazide due to concerns for hyponatremia.  #Hospital Delirium Approximately 7 days into hospitalization, patient became restless, stating he felt anxious. He kept wanting to get up out of bed and into his chair. Neuro exam with no new deficits. Added prn hydroxyzine  and Seroquel  at bedtime, placed on delirium precautions. Intermittently alert and oriented to name, place, situation, season, but not year.  #Type 2 diabetes mellitus, non-insulin  dependent, controlled, complicated by peripheral neuropathy and large vessel disease  Patient presented with a history of diabetes well controlled with metformin 500 mg daily. A1C in the hospital resulted 6.1. Given patient's control of his diabetes and limited PO intake, held home metformin. Received sensitive SSI. Glucoses ranged from 100-180s.  #Hyponatremia Resolved. Workup showed normal serum osmolality pointing to pseudohyponatremia from hyperglycemia. Did not have protein gap or elevated triglycerides. Not  symptomatic.  #Hypokalemia Resolved. Thought to be due to poor oral intake before Dobhoff and diet were placed. Improved back to normal range.  #Hyperlipidemia Patient has a history of HLD managed with pravastatin at home. Most recent lipid panel in the hospital showed LDL of 109, significantly above goal of <70. Transitioned to rosuvastatin  20 mg daily.  #PAD #History of NSTEMI Patient has an extensive history of PAD with multiple lower extremity revascularization procedures as well as post-procedure NSTEMI in 2022 (without catheterization per patient request). For his PAD, he had been on antiplatelet therapy which has been held in context of his SDH. No acute concerns.  #Constipation Required bowel regimen with scheduled MiraLax  and Sorbitol  solution prn to start having regular bowel movements.   Discharge Subjective: Mark Dickerson reports wanting to leave the hospital. Feeling fine, not in any pain. Overall feels he has progressed in terms of swallowing and speech since admission. He feels strong in his arms and legs. Wondering when he will get his belongings before leaving and for hospital staff to contact his brother. Patient is medically ready for discharge.  Discharge  Exam:   BP (!) 154/77 (BP Location: Right Arm)   Pulse 73   Temp 98 F (36.7 C) (Oral)   Resp 18   Ht 6' (1.829 m)   Wt 80 kg   SpO2 100%   BMI 23.92 kg/m  Physical Exam: Constitutional: chronically ill-appearing, elderly male in no acute distress sitting in recliner chair beside bed HENT: normocephalic atraumatic Eyes: conjunctiva non-erythematous, PERRL, no scleral icterus Cardiovascular: irregularly irregular rhythm with normal rate, no m/r/g Pulmonary/Chest: normal work of breathing on room air, transmitted upper airway sounds on auscultation of anterior lung fields Abdominal: soft, non-tender, non-distended, normal bowel sounds Neurological: Alert and oriented to name, place, season, situation,  but not year, month, or day; mild right facial droop and dysarthria (improved), other CN intact. 5/5 strength in bilateral upper and lower extremities. No sensory deficits. Skin: warm and dry Extremities: no edema or cyanosis Psych: normal mood and affect, thought content normal   Pertinent Labs, Studies, and Procedures:     Latest Ref Rng & Units 08/17/2024    1:59 AM 08/16/2024    1:24 AM 08/15/2024    5:40 AM  CBC  WBC 4.0 - 10.5 K/uL 9.8  10.1  8.5   Hemoglobin 13.0 - 17.0 g/dL 85.0  85.0  85.2   Hematocrit 39.0 - 52.0 % 44.7  44.5  43.1   Platelets 150 - 400 K/uL 198  197  202        Latest Ref Rng & Units 08/17/2024    1:59 AM 08/16/2024    1:24 AM 08/15/2024    5:40 AM  CMP  Glucose 70 - 99 mg/dL 865  883  863   BUN 8 - 23 mg/dL 29  33  37   Creatinine 0.61 - 1.24 mg/dL 9.15  9.08  8.86   Sodium 135 - 145 mmol/L 140  138  140   Potassium 3.5 - 5.1 mmol/L 4.2  4.2  4.2   Chloride 98 - 111 mmol/L 107  107  104   CO2 22 - 32 mmol/L 22  18  22    Calcium  8.9 - 10.3 mg/dL 9.8  9.4  9.4     DG Swallowing Func-Speech Pathology Result Date: 08/04/2024 Table formatting from the original result was not included. Modified Barium Swallow Study Patient Details Name: Mark Dickerson MRN: [id number] Date of Birth: 01-02-1946 Today's Date: 08/04/2024 HPI/PMH: HPI: Pt is a 79 y/o male presenting on 08/03/24 with slurred speech, R facial droop, and difficulty swallowing since 1/19. Found with small volume subdural hematoma along the falx, also with L basal ganglia infarct. Repeat CT stable. 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 Clinical Impression:  Pt has an oral and pharyngeal dysphagia s/p acute stroke. Pt would benefit from Dys 1/Moderately thick liquids as pt displayed overt aspiration of thinner liquids and decreased oral swallow efficiency. SLP f/u as able to determine if PO texture upgrade is appropriate.  Pt displayed oral impairments  across all domains; suspect that deficits in tongue control, lingual motion, and amount/location of oral residue are related to motor impairments of CN VII and XII. Use of straw increased oral efficiency, however decreased airway protection leading to trace aspiration of honey-thick liquid. Pt displayed moderate aspiration of thin and mildly-thick liquids as a result of reduced oral control as well as inadequate pharyngeal coordination and incomplete laryngeal vestibule closure. Pt sensed aspiration events, however was unable to  clear material due to weak cough. Airway protection improved with honey thick liquids by cup/spoon when pt had more time for coordination. Pt displayed moderate pharyngeal residue with honey-thick liquids from reduced pharyngeal contraction and particularly curved epiglottis, but this cleared with a swallow of puree. Whole pill administered with puree, however pt was unable to clear pill from oral cavity.  Factors that may increase risk of adverse event in presence of aspiration Noe & Lianne 2021): Factors that may increase risk of adverse event in presence of aspiration Noe & Lianne 2021): Reduced cognitive function; Weak cough; Inadequate oral hygiene; Limited mobility; Aspiration of thick, dense, and/or acidic materials Recommendations/Plan: Swallowing Evaluation Recommendations Swallowing Evaluation Recommendations Recommendations: PO diet PO Diet Recommendation: Dysphagia 1 (Pureed); Moderately thick liquids (Level 3, honey thick) Liquid Administration via: Cup; Spoon; No straw Medication Administration: Crushed with puree Supervision: Staff to assist with self-feeding; Full supervision/cueing for swallowing strategies Swallowing strategies  : Slow rate; Small bites/sips; Check for pocketing or oral holding; Follow solids with liquids; Minimize environmental distractions; Check for anterior loss Postural changes: Position pt fully upright for meals; Stay upright 30-60 min  after meals Oral care recommendations: Oral care BID (2x/day) Caregiver Recommendations: Avoid jello, ice cream, thin soups, popsicles; Remove water  pitcher; Have oral suction available Treatment Plan Treatment Plan Treatment recommendations: Therapy as outlined in treatment plan below Follow-up recommendations: Acute inpatient rehab (3 hours/day) Functional status assessment: Patient has had a recent decline in their functional status and demonstrates the ability to make significant improvements in function in a reasonable and predictable amount of time. Treatment frequency: Min 2x/week Treatment duration: 2 weeks Interventions: Aspiration precaution training; Compensatory techniques; Patient/family education; Trials of upgraded texture/liquids; Diet toleration management by SLP Recommendations Recommendations for follow up therapy are one component of a multi-disciplinary discharge planning process, led by the attending physician.  Recommendations may be updated based on patient status, additional functional criteria and insurance authorization. Assessment: Orofacial Exam: Orofacial Exam Oral Cavity: Oral Hygiene: Lingual coating Oral Cavity - Dentition: Missing dentition Orofacial Anatomy: WFL Oral Motor/Sensory Function: Suspected cranial nerve impairment CN VII - Facial: Right motor impairment CN XII - Hypoglossal: Right motor impairment Anatomy: Anatomy: Suspected cervical osteophytes Boluses Administered: Boluses Administered Boluses Administered: Thin liquids (Level 0); Mildly thick liquids (Level 2, nectar thick); Moderately thick liquids (Level 3, honey thick); Puree; Solid  Oral Impairment Domain: Oral Impairment Domain Lip Closure: Escape beyond mid-chin (R-sided) Tongue control during bolus hold: Posterior escape of less than half of bolus Bolus preparation/mastication: Slow prolonged chewing/mashing with complete recollection Bolus transport/lingual motion: Slow tongue motion Oral residue: Majority of  bolus remaining Location of oral residue : Floor of mouth; Lateral sulci; Tongue; Palate Initiation of pharyngeal swallow : Pyriform sinuses  Pharyngeal Impairment Domain: Pharyngeal Impairment Domain Soft palate elevation: Trace column of contrast or air between SP and PW Laryngeal elevation: Partial superior movement of thyroid  cartilage/partial approximation of arytenoids to epiglottic petiole Anterior hyoid excursion: Complete anterior movement Epiglottic movement: Partial inversion Laryngeal vestibule closure: Incomplete, narrow column air/contrast in laryngeal vestibule Pharyngeal stripping wave : Present - diminished Pharyngeal contraction (A/P view only): N/A Pharyngoesophageal segment opening: Partial distention/partial duration, partial obstruction of flow Tongue base retraction: Trace column of contrast or air between tongue base and PPW Pharyngeal residue: Collection of residue within or on pharyngeal structures Location of pharyngeal residue: Diffuse (>3 areas)  Esophageal Impairment Domain: Esophageal Impairment Domain Esophageal clearance upright position: Esophageal retention Pill: Pill Consistency administered: Puree Puree: Impaired (see clinical impressions)  Penetration/Aspiration Scale Score: Penetration/Aspiration Scale Score 1.  Material does not enter airway: Puree; Solid 7.  Material enters airway, passes BELOW cords and not ejected out despite cough attempt by patient: Thin liquids (Level 0); Mildly thick liquids (Level 2, nectar thick); Moderately thick liquids (Level 3, honey thick) Compensatory Strategies: Compensatory Strategies Compensatory strategies: Yes Straw: Ineffective Ineffective Straw: Moderately thick liquid (Level 3, honey thick); Mildly thick liquid (Level 2, nectar thick); Thin liquid (Level 0) Multiple swallows: Ineffective Ineffective Multiple Swallows: Thin liquid (Level 0); Mildly thick liquid (Level 2, nectar thick); Moderately thick liquid (Level 3, honey thick)  Other(comment): Ineffective (Hard cough after swallow) Ineffective Other(comment): Thin liquid (Level 0); Mildly thick liquid (Level 2, nectar thick)   General Information: Caregiver present: No  Diet Prior to this Study: NPO   Temperature : Normal   Respiratory Status: WFL   Supplemental O2: None (Room air)   History of Recent Intubation: No  Behavior/Cognition: Alert; Cooperative; Pleasant mood; Impulsive; Requires cueing Self-Feeding Abilities: Needs assist with self-feeding Baseline vocal quality/speech: Normal Volitional Cough: Able to elicit Volitional Swallow: Able to elicit Exam Limitations: No limitations Goal Planning: Prognosis for improved oropharyngeal function: Good Barriers to Reach Goals: Cognitive deficits; Language deficits; Severity of deficits No data recorded Patient/Family Stated Goal: none stated Consulted and agree with results and recommendations: Patient Pain: Pain Assessment Pain Assessment: Faces Faces Pain Scale: 0 End of Session: Start Time:SLP Start Time (ACUTE ONLY): 1300 Stop Time: SLP Stop Time (ACUTE ONLY): 1331 Time Calculation:SLP Time Calculation (min) (ACUTE ONLY): 31 min Charges: SLP Evaluations $ SLP Speech Visit: 1 Visit SLP Evaluations $BSS Swallow: 1 Procedure $MBS Swallow: 1 Procedure $ SLP EVAL LANGUAGE/SOUND PRODUCTION: 1 Procedure SLP visit diagnosis: SLP Visit Diagnosis: Dysphagia, oropharyngeal phase (R13.12) Past Medical History: Past Medical History: Diagnosis Date  Diabetes mellitus without complication (HCC)   Hyperlipidemia   Hypertension   Myocardial infarction (HCC)   Peripheral arterial disease   Tobacco abuse  Past Surgical History: Past Surgical History: Procedure Laterality Date  ABDOMINAL AORTOGRAM W/LOWER EXTREMITY N/A 09/19/2020  Procedure: ABDOMINAL AORTOGRAM W/LOWER EXTREMITY;  Surgeon: Magda Debby SAILOR, MD;  Location: [facility] INVASIVE [facility];  Service: Cardiovascular;  Laterality: N/A;  AMPUTATION Left 11/01/2020  Procedure: LEFT THIRD TOE AMPUTATION;   Surgeon: Magda Debby SAILOR, MD;  Location: [facility] OR;  Service: Vascular;  Laterality: Left;  AORTA - BILATERAL FEMORAL ARTERY BYPASS GRAFT N/A 09/24/2020  Procedure: AORTA BIFEMORAL BYPASS GRAFT;  Surgeon: Magda Debby SAILOR, MD;  Location: [facility] OR;  Service: Vascular;  Laterality: N/A;  AORTIC ENDARTERECETOMY N/A 09/24/2020  Procedure: AORTIC ENDARTERECETOMY;  Surgeon: Magda Debby SAILOR, MD;  Location: [facility] OR;  Service: Vascular;  Laterality: N/A;  ENDARTERECTOMY FEMORAL Bilateral 09/24/2020  Procedure: ENDARTERECTOMY BILATERAL COMMON FEMORAL ARTERIES;  Surgeon: Magda Debby SAILOR, MD;  Location: [facility] OR;  Service: Vascular;  Laterality: Bilateral;  FEMORAL-POPLITEAL BYPASS GRAFT Left 09/24/2020  Procedure: LEFT FEMORAL ARTERY TO BELOW KNEE POPLITEAL ARTERY BY PASS GRAFT USING NON REVERSED SAPPENOUS VEIN.;  Surgeon: Magda Debby SAILOR, MD;  Location: [facility] OR;  Service: Vascular;  Laterality: Left;  INTRAOPERATIVE ARTERIOGRAM Bilateral 09/24/2020  Procedure: INTRA OPERATIVE ARTERIOGRAM RIGHT AND LEFT LEG;  Surgeon: Magda Debby SAILOR, MD;  Location: [facility] OR;  Service: Vascular;  Laterality: Bilateral;  IR ANGIOGRAM EXTREMITY BILATERAL  08/31/2020  IR RADIOLOGIST EVAL & MGMT  08/14/2020  IR US  GUIDE VASC ACCESS LEFT  08/31/2020  IR US  GUIDE VASC ACCESS RIGHT  08/31/2020  VEIN HARVEST Left 09/24/2020  Procedure: VEIN  HARVEST LEFT GREATER SAPPHENOUS;  Surgeon: Magda Debby SAILOR, MD;  Location: [facility] OR;  Service: Vascular;  Laterality: Left;  WOUND DEBRIDEMENT Left 11/01/2020  Procedure: DEBRIDEMENT LEFT FOOT WOUND;  Surgeon: Magda Debby SAILOR, MD;  Location: [facility] OR;  Service: Vascular;  Laterality: Left; Note populated for Mark Dickerson, Student SLP Leita SAILOR., M.A. CCC-SLP Acute Rehabilitation Services Office: [phone] Secure chat preferred 08/04/2024, 4:14 PM  ECHOCARDIOGRAM COMPLETE Result Date: 08/04/2024    ECHOCARDIOGRAM REPORT   Patient Name:   Mark Dickerson Date of Exam: 08/04/2024 Medical Rec #:  [id number]          Height:       72.0 in  Accession #:    [id number]         Weight:       180.0 lb Date of Birth:  04-20-1946           BSA:          2.037 m Patient Age:    78 years           BP:           165/85 mmHg Patient Gender: M                  HR:           80 bpm. Exam Location:  Inpatient Procedure: 2D Echo, Cardiac Doppler and Color Doppler (Both Spectral and Color            Flow Doppler were utilized during procedure). Indications:    Stroke I63.9  History:        Patient has prior history of Echocardiogram examinations, most                 recent 09/20/2020. Arrythmias:Atrial Fibrillation.  Sonographer:    Nathanel Devonshire Referring Phys: [id number] DAN FLOYD IMPRESSIONS  1. Left ventricular ejection fraction, by estimation, is 55 to 60%. The left ventricle has normal function. The left ventricle has no regional wall motion abnormalities. Left ventricular diastolic function could not be evaluated.  2. Right ventricular systolic function is normal. The right ventricular size is normal. There is mildly elevated pulmonary artery systolic pressure. The estimated right ventricular systolic pressure is 36.6 mmHg.  3. The mitral valve is degenerative. No evidence of mitral valve regurgitation. No evidence of mitral stenosis.  4. The aortic valve is tricuspid. Aortic valve regurgitation is not visualized. Aortic valve sclerosis is present, with no evidence of aortic valve stenosis.  5. The inferior vena cava is normal in size with greater than 50% respiratory variability, suggesting right atrial pressure of 3 mmHg. Comparison(s): A prior study was performed on 09/20/2020. No significant change from prior study. Conclusion(s)/Recommendation(s): No intracardiac source of embolism detected on this transthoracic study. Consider a transesophageal echocardiogram to exclude cardiac source of embolism if clinically indicated. FINDINGS  Left Ventricle: Left ventricular ejection fraction, by estimation, is 55 to 60%. The left ventricle has normal function. The left  ventricle has no regional wall motion abnormalities. The left ventricular internal cavity size was normal in size. There is  no left ventricular hypertrophy. Left ventricular diastolic function could not be evaluated due to atrial fibrillation. Left ventricular diastolic function could not be evaluated. Right Ventricle: The right ventricular size is normal. No increase in right ventricular wall thickness. Right ventricular systolic function is normal. There is mildly elevated pulmonary artery systolic pressure. The tricuspid regurgitant velocity is 2.90  m/s, and with an assumed right atrial pressure  of 3 mmHg, the estimated right ventricular systolic pressure is 36.6 mmHg. Left Atrium: Left atrial size was normal in size. Right Atrium: Right atrial size was normal in size. Pericardium: There is no evidence of pericardial effusion. Mitral Valve: The mitral valve is degenerative in appearance. Mild mitral annular calcification. No evidence of mitral valve regurgitation. No evidence of mitral valve stenosis. Tricuspid Valve: The tricuspid valve is grossly normal. Tricuspid valve regurgitation is mild . No evidence of tricuspid stenosis. Aortic Valve: The aortic valve is tricuspid. Aortic valve regurgitation is not visualized. Aortic valve sclerosis is present, with no evidence of aortic valve stenosis. Aortic valve mean gradient measures 4.0 mmHg. Aortic valve peak gradient measures 7.1  mmHg. Aortic valve area, by VTI measures 2.12 cm. Pulmonic Valve: The pulmonic valve was normal in structure. Pulmonic valve regurgitation is not visualized. No evidence of pulmonic stenosis. Aorta: The aortic root and ascending aorta are structurally normal, with no evidence of dilitation. Venous: The inferior vena cava is normal in size with greater than 50% respiratory variability, suggesting right atrial pressure of 3 mmHg. IAS/Shunts: The atrial septum is grossly normal.  LEFT VENTRICLE PLAX 2D LVIDd:         4.20 cm      Diastology LVIDs:         2.90 cm     LV e' medial:   8.49 cm/s LV PW:         1.00 cm     LV E/e' medial: 7.6 LV IVS:        0.90 cm LVOT diam:     1.90 cm LV SV:         45 LV SV Index:   22 LVOT Area:     2.84 cm  LV Volumes (MOD) LV vol d, MOD A2C: 44.3 ml LV vol d, MOD A4C: 57.4 ml LV vol s, MOD A2C: 19.9 ml LV vol s, MOD A4C: 23.1 ml LV SV MOD A2C:     24.4 ml LV SV MOD A4C:     57.4 ml LV SV MOD BP:      31.0 ml RIGHT VENTRICLE          IVC RV Basal diam:  2.60 cm  IVC diam: 1.80 cm TAPSE (M-mode): 1.6 cm LEFT ATRIUM             Index LA diam:        3.00 cm 1.47 cm/m LA Vol (A2C):   41.1 ml 20.17 ml/m LA Vol (A4C):   38.2 ml 18.75 ml/m LA Biplane Vol: 40.4 ml 19.83 ml/m  AORTIC VALVE                    PULMONIC VALVE AV Area (Vmax):    1.97 cm     PV Vmax:       1.41 m/s AV Area (Vmean):   1.75 cm     PV Peak grad:  8.0 mmHg AV Area (VTI):     2.12 cm AV Vmax:           133.00 cm/s AV Vmean:          92.600 cm/s AV VTI:            0.210 m AV Peak Grad:      7.1 mmHg AV Mean Grad:      4.0 mmHg LVOT Vmax:         92.60 cm/s LVOT Vmean:  57.000 cm/s LVOT VTI:          0.157 m LVOT/AV VTI ratio: 0.75  AORTA Ao Root diam: 2.80 cm Ao Asc diam:  2.80 cm MV E velocity: 64.50 cm/s  TRICUSPID VALVE                            TR Peak grad:   33.6 mmHg                            TR Vmax:        290.00 cm/s                             SHUNTS                            Systemic VTI:  0.16 m                            Systemic Diam: 1.90 cm Sunit Tolia Electronically signed by Madonna Large Signature Date/Time: 08/04/2024/12:36:48 PM    Final    CT HEAD POST STROKE FOLLOWUP/TIMED/STAT READ Result Date: 08/03/2024 EXAM: CT HEAD WITHOUT CONTRAST 08/03/2024 05:07:59 PM TECHNIQUE: CT of the head was performed without the administration of intravenous contrast. Automated exposure control, iterative reconstruction, and/or weight based adjustment of the mA/kV was utilized to reduce the radiation dose to as low as  reasonably achievable. COMPARISON: 08/03/2024 CLINICAL HISTORY: Neuro deficit, acute, stroke suspected FINDINGS: BRAIN AND VENTRICLES: Stable 5 mm parafalcine subdural hemorrhage. No new or enlarging foci of intracranial hemorrhage. 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 Chronic microvascular ischemic changes and mild parenchymal volume loss. No evidence of acute infarct. No hydrocephalus. No mass effect or midline shift. ORBITS: Bilateral lens replacement. SINUSES: No acute abnormality. SOFT TISSUES AND SKULL: No acute soft tissue abnormality. No skull fracture. IMPRESSION: 1. Stable 5 mm parafalcine subdural hemorrhage. 2. No new or enlarging intracranial hemorrhage. Electronically signed by: Donnice Mania MD 08/03/2024 05:15 PM EST RP Workstation: [id number]   MR BRAIN WO CONTRAST Result Date: 08/03/2024 EXAM: MRI Brain Without Contrast 08/03/2024 01:38:21 PM TECHNIQUE: Multiplanar multisequence MRI of the head/brain was performed without the administration of intravenous contrast. COMPARISON: None available. CLINICAL HISTORY: Neuro deficit, acute, stroke suspected FINDINGS: BRAIN AND VENTRICLES: Small acute infarct in the left basal ganglia. No intracranial hemorrhage. No mass. No midline shift. No hydrocephalus. The sella is unremarkable. Normal flow voids. Moderate patchy T2 hyperintensities in the white matter, compatible with chronic microvascular ischemic disease. ORBITS: No significant abnormality. SINUSES AND MASTOIDS: No significant abnormality. BONES AND SOFT TISSUES: Normal marrow signal. No soft tissue abnormality. IMPRESSION: 1. Small acute infarct in the left basal ganglia. 2. Moderate chronic microvascular ischemic change. Electronically signed by: Glendia Molt MD 08/03/2024 02:46 PM EST RP Workstation: [id number]   CT ANGIO HEAD NECK W WO CM Result Date: 08/03/2024 EXAM: CTA HEAD AND NECK WITH AND WITHOUT 08/03/2024 11:49:09 AM TECHNIQUE: CTA of the head and neck was performed with and  without the administration of 75 mL of intravenous contrast (iohexol  (OMNIPAQUE ) 350 MG/ML injection). Multiplanar 2D and/or 3D reformatted images are provided for review. Automated exposure control, iterative reconstruction, and/or weight based adjustment of the mA/kV was utilized to reduce the radiation dose to as low as reasonably achievable.  Stenosis of the internal carotid arteries measured using NASCET criteria. COMPARISON: CT head dated 08/03/2024 11:49:09 AM CLINICAL HISTORY: Stroke/TIA, determine embolic source. FINDINGS: CTA NECK: AORTIC ARCH AND ARCH VESSELS: Mild atherosclerosis of the aortic arch. Atherosclerosis involving the left subclavian artery origin resulting in mild stenosis. Additional atherosclerosis along the brachiocephalic artery and proximal right subclavian artery resulting in mild to moderate stenosis of the proximal right subclavian artery. No dissection or arterial injury. CERVICAL CAROTID ARTERIES: Right carotid bifurcation: Prominent calcified atherosclerosis. Severe stenosis at the distal aspect of the right common carotid artery just prior to the bifurcation. Severe stenosis at the origin of the right cervical ICA with approximately 90% narrowing of the vessel. Additional atherosclerosis along the right carotid bulb which results in approximately 75% stenosis. Left carotid bifurcation: Prominent calcified atherosclerosis. Approximately 60% stenosis at the origin of the left cervical ICA with additional atherosclerosis along the left carotid bulb resulting in similar stenosis. No dissection or arterial injury. CERVICAL VERTEBRAL ARTERIES: The right vertebral artery is dominant. Bulky calcified atherosclerosis at the origin of the right vertebral artery resulting in moderate stenosis. Atherosclerosis at the origin of the nondominant left vertebral artery also resulting in moderate stenosis. Atherosclerosis of the right V4 segment resulting in mild stenosis. No dissection or arterial  injury. LUNGS AND MEDIASTINUM: Centrilobular emphysema in the lung apices. SOFT TISSUES: Edentulous maxilla. BONES: Degenerative changes throughout the visualized spine with disc space narrowing and prominent degenerative endplate osteophytes most pronounced at C4-C5. There is facet arthrosis at multiple levels throughout the cervical spine greatest on the left at C2-C3 and C3-C4. 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 There is moderate stenosis at the posterior vertical portion of the left cavernous ICA. Additional mild to moderate stenosis throughout the bilateral cavernous and supraclinoid ICAs. No significant stenosis of the anterior cerebral arteries. No significant stenosis of the middle cerebral arteries. No aneurysm. POSTERIOR CIRCULATION: No significant stenosis of the posterior cerebral arteries. No significant stenosis of the basilar artery. No significant stenosis of the intracranial vertebral arteries. No aneurysm. OTHER: Redemonstrated subdural hematoma along the anterior falx. No dural venous sinus thrombosis on this non-dedicated study. IMPRESSION: 1. No acute large vessel occlusion. 2. 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 3. Approximately 60% stenosis at the origin of the left cervical ICA. 4. Moderate stenosis at the origins of the bilateral vertebral arteries. 5. Moderate stenosis at the posterior vertical portion of the left cavernous ICA, with additional mild to moderate stenosis throughout the bilateral cavernous and supraclinoid ICAs. 6. Redemonstrated subdural hematoma along the anterior falx. 7. Pulmonary emphysema is an independent risk factor for lung cancer. Recommend consideration for evaluation for a low-dose CT lung cancer screening program. Electronically signed by: Donnice Mania MD  08/03/2024 12:16 PM EST RP Workstation: [id number]   DG Chest Port 1 View Result Date: 08/03/2024 CLINICAL DATA:  Hypoxia. EXAM: PORTABLE CHEST 1 VIEW COMPARISON:  03/31/2024 FINDINGS: Lungs are somewhat hypoinflated. Increased size of masslike opacity over the right infrahilar region measuring approximately 5.4 x 7.2 cm. Subcentimeter rounded nodular opacity over the left lung base which may represent the left nipple. No effusion. Cardiomediastinal silhouette and remainder of the exam is unchanged. IMPRESSION: 1. Increased size of masslike opacity over the right infrahilar region measuring approximately 5.4 x 7.2 cm. Recommend further evaluation with chest CT with contrast. 2.  Subcentimeter rounded nodular opacity over the left lung base which may represent the left nipple. This can be evaluated on the chest CT. Electronically Signed   By: Toribio Agreste M.D.   On: 08/03/2024 11:18   CT HEAD WO CONTRAST Result Date: 08/03/2024 CLINICAL DATA:  Slurred speech and facial droop with urinary incontinence a few days. Symptoms began Monday morning. EXAM: CT HEAD WITHOUT CONTRAST TECHNIQUE: Contiguous axial images were obtained from the base of the skull through the vertex without intravenous contrast. RADIATION DOSE REDUCTION: This exam was performed according to the departmental dose-optimization program which includes automated exposure control, adjustment of the mA and/or kV according to patient size and/or use of iterative reconstruction technique. COMPARISON:  03/31/2024 FINDINGS: Brain: Examination demonstrates acute subdural hemorrhage along the high frontal parietal falx cerebri. This measures 5 mm in thickness. There is no significant mass effect or midline shift. Ventricles and cisterns are otherwise normal. There is chronic ischemic microvascular disease present. Old lacunar infarct over the right basal ganglia. Small old lacunar infarct over the left caudate nucleus. Mild prominence of the cisterna magna.  Vascular: No hyperdense vessel or unexpected calcification. Skull: Normal. Negative for fracture or focal lesion. Sinuses/Orbits: No acute finding. Other: None. IMPRESSION: 1. Acute subdural hematoma along the high frontal parietal falx cerebri measuring 5 mm in thickness. No significant mass effect or midline shift. 2. 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 Critical Value/emergent results were called by telephone at the time of interpretation on 08/03/2024 at 11:13 am to provider DAN FLOYD , who verbally acknowledged these results. Electronically Signed   By: Toribio Agreste M.D.   On: 08/03/2024 11:13       Discharge Instructions      To Mark Dickerson or their caretakers,  You were recently admitted to [facility] for a stroke of a part of your brain called the basal ganglia. Your condition was complicated by difficulty swallowing (dysphagia) which requires a special diet. You briefly had a feeding tube down your nose called a Dobhoff tube to supplement your nutrition while your swallowing function returned. Unfortunately we cannot give you a timeline on when to expect recovery back to your health before your stroke but deficits after a stroke take weeks to months to recover.  We have made some adjustments to your home medications to control your blood pressure better. Something else to think about will be your goals for care moving forward. You should discuss with your family and primary care provider regarding your CODE status and what you would like your medical care to look like should you require future hospitalizations.  Continue taking your home medications with the following changes:  Start taking Metoprolol  to tartrate (Lopressor ) 12.5 mg twice daily Amlodipine  10 mg daily Crestor  20 mg daily Quetiapine  (Seroquel ) 25 mg nightly as needed Melatonin 3 mg nightly Mucinex  600 mg twice daily as needed for cough MiraLAX /GlycoLax  17 g twice  daily Stop taking Metoprolol  succinate (Toprol -XL) 50 mg daily Plavix  (clopidogrel ) 75 mg daily Hydrochlorothiazide (HydroDIURIL) 25 mg daily Pravastatin (Pravachol) 20 mg daily   You should seek further medical care if you experience one-sided weakness or numbness in your arms or legs, worsening facial droop, severe confusion, severe dizziness, loss of vision, or an extremely severe headache.  We recommend that you also see your primary care doctor in about a week to make sure that you continue to improve. Please see your follow up information below. We are so glad  that you are feeling better.  Sincerely,  [facility]    Contact information for follow-up providers     [facility]. Schedule an appointment as soon as possible for a visit in 1 month(s).   Specialty: Neurology Why: stroke clinic Contact information: [address] [place]  [address] [phone]             Contact information for after-discharge care     Destination     [facility], [place] .   Service: Skilled Nursing Contact information: [address] [place]  [phone]                           Signed:  Letha Cheadle, MD Internal Medicine Resident, PGY-1 08/17/2024, 10:18 AM Please contact the on call pager after 5 pm and on weekends at [phone].  "

## 2024-08-17 NOTE — Progress Notes (Signed)
 Report given to Nurse Nathanel at [facility].

## 2024-08-17 NOTE — Plan of Care (Signed)
 " Problem: Education: Goal: Knowledge of disease or condition will improve 08/17/2024 0553 by Daniel Cipriano RAMAN, RN Outcome: Progressing 08/17/2024 0552 by Daniel Cipriano RAMAN, RN Outcome: Progressing Goal: Knowledge of secondary prevention will improve (MUST DOCUMENT ALL) 08/17/2024 0553 by Daniel Cipriano RAMAN, RN Outcome: Progressing 08/17/2024 0552 by Daniel Cipriano RAMAN, RN Outcome: Progressing Goal: Knowledge of patient specific risk factors will improve (DELETE if not current risk factor) 08/17/2024 0553 by Daniel Cipriano RAMAN, RN Outcome: Progressing 08/17/2024 0552 by Daniel Cipriano RAMAN, RN Outcome: Progressing   Problem: Ischemic Stroke/TIA Tissue Perfusion: Goal: Complications of ischemic stroke/TIA will be minimized 08/17/2024 0553 by Daniel Cipriano RAMAN, RN Outcome: Progressing 08/17/2024 0552 by Daniel Cipriano RAMAN, RN Outcome: Progressing   Problem: Coping: Goal: Will verbalize positive feelings about self 08/17/2024 0553 by Daniel Cipriano RAMAN, RN Outcome: Progressing 08/17/2024 0552 by Daniel Cipriano RAMAN, RN Outcome: Progressing Goal: Will identify appropriate support needs 08/17/2024 0553 by Daniel Cipriano RAMAN, RN Outcome: Progressing 08/17/2024 0552 by Daniel Cipriano RAMAN, RN Outcome: Progressing   Problem: Health Behavior/Discharge Planning: Goal: Ability to manage health-related needs will improve 08/17/2024 0553 by Daniel Cipriano RAMAN, RN Outcome: Progressing 08/17/2024 0552 by Daniel Cipriano RAMAN, RN Outcome: Progressing Goal: Goals will be collaboratively established with patient/family 08/17/2024 [phone] by Daniel Cipriano RAMAN, RN Outcome: Progressing 08/17/2024 0552 by Daniel Cipriano RAMAN, RN Outcome: Progressing   Problem: Self-Care: Goal: Ability to participate in self-care as condition permits will improve 08/17/2024 0553 by Daniel Cipriano RAMAN, RN Outcome: Progressing 08/17/2024 0552 by Daniel Cipriano RAMAN, RN Outcome: Progressing Goal: Verbalization of feelings and concerns over  difficulty with self-care will improve 08/17/2024 0553 by Daniel Cipriano RAMAN, RN Outcome: Progressing 08/17/2024 0552 by Daniel Cipriano RAMAN, RN Outcome: Progressing Goal: Ability to communicate needs accurately will improve 08/17/2024 0553 by Daniel Cipriano RAMAN, RN Outcome: Progressing 08/17/2024 0552 by Daniel Cipriano RAMAN, RN Outcome: Progressing   Problem: Nutrition: Goal: Risk of aspiration will decrease 08/17/2024 0553 by Daniel Cipriano RAMAN, RN Outcome: Progressing 08/17/2024 0552 by Daniel Cipriano RAMAN, RN Outcome: Progressing Goal: Dietary intake will improve 08/17/2024 0553 by Daniel Cipriano RAMAN, RN Outcome: Progressing 08/17/2024 0552 by Daniel Cipriano RAMAN, RN Outcome: Progressing   Problem: Education: Goal: Knowledge of General Education information will improve Description: Including pain rating scale, medication(s)/side effects and non-pharmacologic comfort measures 08/17/2024 0553 by Daniel Cipriano RAMAN, RN Outcome: Progressing 08/17/2024 0552 by Daniel Cipriano RAMAN, RN Outcome: Progressing   Problem: Health Behavior/Discharge Planning: Goal: Ability to manage health-related needs will improve 08/17/2024 0553 by Daniel Cipriano RAMAN, RN Outcome: Progressing 08/17/2024 0552 by Daniel Cipriano RAMAN, RN Outcome: Progressing   Problem: Clinical Measurements: Goal: Ability to maintain clinical measurements within normal limits will improve 08/17/2024 0553 by Daniel Cipriano RAMAN, RN Outcome: Progressing 08/17/2024 0552 by Daniel Cipriano RAMAN, RN Outcome: Progressing Goal: Will remain free from infection 08/17/2024 0553 by Daniel Cipriano RAMAN, RN Outcome: Progressing 08/17/2024 0552 by Daniel Cipriano RAMAN, RN Outcome: Progressing Goal: Diagnostic test results will improve 08/17/2024 0553 by Daniel Cipriano RAMAN, RN Outcome: Progressing 08/17/2024 0552 by Daniel Cipriano RAMAN, RN Outcome: Progressing Goal: Respiratory complications will improve 08/17/2024 0553 by Daniel Cipriano RAMAN, RN Outcome:  Progressing 08/17/2024 0552 by Daniel Cipriano RAMAN, RN Outcome: Progressing Goal: Cardiovascular complication will be avoided 08/17/2024 0553 by Daniel Cipriano RAMAN, RN Outcome: Progressing 08/17/2024 0552 by Daniel Cipriano RAMAN, RN Outcome: Progressing   Problem: Activity: Goal: Risk for activity intolerance will decrease 08/17/2024 0553 by Daniel Cipriano RAMAN, RN Outcome: Progressing 08/17/2024 [phone]  by Daniel Cipriano RAMAN, RN Outcome: Progressing   Problem: Nutrition: Goal: Adequate nutrition will be maintained 08/17/2024 0553 by Daniel Cipriano RAMAN, RN Outcome: Progressing 08/17/2024 0552 by Daniel Cipriano RAMAN, RN Outcome: Progressing   Problem: Coping: Goal: Level of anxiety will decrease 08/17/2024 0553 by Daniel Cipriano RAMAN, RN Outcome: Progressing 08/17/2024 0552 by Daniel Cipriano RAMAN, RN Outcome: Progressing   Problem: Elimination: Goal: Will not experience complications related to bowel motility 08/17/2024 0553 by Daniel Cipriano RAMAN, RN Outcome: Progressing 08/17/2024 0552 by Daniel Cipriano RAMAN, RN Outcome: Progressing Goal: Will not experience complications related to urinary retention 08/17/2024 0553 by Daniel Cipriano RAMAN, RN Outcome: Progressing 08/17/2024 0552 by Daniel Cipriano RAMAN, RN Outcome: Progressing   Problem: Pain Managment: Goal: General experience of comfort will improve and/or be controlled 08/17/2024 0553 by Daniel Cipriano RAMAN, RN Outcome: Progressing 08/17/2024 0552 by Daniel Cipriano RAMAN, RN Outcome: Progressing   Problem: Safety: Goal: Ability to remain free from injury will improve 08/17/2024 0553 by Daniel Cipriano RAMAN, RN Outcome: Progressing 08/17/2024 0552 by Daniel Cipriano RAMAN, RN Outcome: Progressing   Problem: Skin Integrity: Goal: Risk for impaired skin integrity will decrease 08/17/2024 0553 by Daniel Cipriano RAMAN, RN Outcome: Progressing 08/17/2024 0552 by Daniel Cipriano RAMAN, RN Outcome: Progressing   Problem: Education: Goal: Ability to describe self-care  measures that may prevent or decrease complications (Diabetes Survival Skills Education) will improve 08/17/2024 0553 by Daniel Cipriano RAMAN, RN Outcome: Progressing 08/17/2024 0552 by Daniel Cipriano RAMAN, RN Outcome: Progressing Goal: Individualized Educational Video(s) 08/17/2024 0553 by Daniel Cipriano RAMAN, RN Outcome: Progressing 08/17/2024 0552 by Daniel Cipriano RAMAN, RN Outcome: Progressing   Problem: Coping: Goal: Ability to adjust to condition or change in health will improve 08/17/2024 0553 by Daniel Cipriano RAMAN, RN Outcome: Progressing 08/17/2024 0552 by Daniel Cipriano RAMAN, RN Outcome: Progressing   Problem: Fluid Volume: Goal: Ability to maintain a balanced intake and output will improve 08/17/2024 0553 by Daniel Cipriano RAMAN, RN Outcome: Progressing 08/17/2024 0552 by Daniel Cipriano RAMAN, RN Outcome: Progressing   Problem: Health Behavior/Discharge Planning: Goal: Ability to identify and utilize available resources and services will improve 08/17/2024 0553 by Daniel Cipriano RAMAN, RN Outcome: Progressing 08/17/2024 0552 by Daniel Cipriano RAMAN, RN Outcome: Progressing Goal: Ability to manage health-related needs will improve 08/17/2024 0553 by Daniel Cipriano RAMAN, RN Outcome: Progressing 08/17/2024 0552 by Daniel Cipriano RAMAN, RN Outcome: Progressing   Problem: Metabolic: Goal: Ability to maintain appropriate glucose levels will improve 08/17/2024 0553 by Daniel Cipriano RAMAN, RN Outcome: Progressing 08/17/2024 0552 by Daniel Cipriano RAMAN, RN Outcome: Progressing   Problem: Nutritional: Goal: Maintenance of adequate nutrition will improve 08/17/2024 0553 by Daniel Cipriano RAMAN, RN Outcome: Progressing 08/17/2024 0552 by Daniel Cipriano RAMAN, RN Outcome: Progressing Goal: Progress toward achieving an optimal weight will improve 08/17/2024 0553 by Daniel Cipriano RAMAN, RN Outcome: Progressing 08/17/2024 0552 by Daniel Cipriano RAMAN, RN Outcome: Progressing   Problem: Skin Integrity: Goal: Risk for impaired  skin integrity will decrease 08/17/2024 0553 by Daniel Cipriano RAMAN, RN Outcome: Progressing 08/17/2024 0552 by Daniel Cipriano RAMAN, RN Outcome: Progressing   Problem: Tissue Perfusion: Goal: Adequacy of tissue perfusion will improve 08/17/2024 0553 by Daniel Cipriano RAMAN, RN Outcome: Progressing 08/17/2024 0552 by Daniel Cipriano RAMAN, RN Outcome: Progressing   "

## 2024-09-05 ENCOUNTER — Ambulatory Visit: Admitting: Podiatry
